# Patient Record
Sex: Female | Born: 1995 | Hispanic: Yes | Marital: Single | Smoking: Never smoker
Health system: Southern US, Community
[De-identification: ages and names within clinical notes are randomized; demographics above are authoritative.]

## PROBLEM LIST (undated history)

## (undated) DIAGNOSIS — I10 Essential (primary) hypertension: Secondary | ICD-10-CM

## (undated) DIAGNOSIS — Z9289 Personal history of other medical treatment: Secondary | ICD-10-CM

## (undated) DIAGNOSIS — R569 Unspecified convulsions: Secondary | ICD-10-CM

## (undated) DIAGNOSIS — S2243XA Multiple fractures of ribs, bilateral, initial encounter for closed fracture: Secondary | ICD-10-CM

## (undated) DIAGNOSIS — I499 Cardiac arrhythmia, unspecified: Secondary | ICD-10-CM

## (undated) DIAGNOSIS — S42109A Fracture of unspecified part of scapula, unspecified shoulder, initial encounter for closed fracture: Secondary | ICD-10-CM

## (undated) DIAGNOSIS — S36113A Laceration of liver, unspecified degree, initial encounter: Secondary | ICD-10-CM

## (undated) DIAGNOSIS — S069X9A Unspecified intracranial injury with loss of consciousness of unspecified duration, initial encounter: Secondary | ICD-10-CM

## (undated) HISTORY — PX: FRACTURE SURGERY: SHX138

---

## 2017-05-02 DIAGNOSIS — Z9289 Personal history of other medical treatment: Secondary | ICD-10-CM

## 2017-05-02 HISTORY — DX: Personal history of other medical treatment: Z92.89

## 2017-05-30 ENCOUNTER — Inpatient Hospital Stay (HOSPITAL_COMMUNITY): Payer: No Typology Code available for payment source

## 2017-05-30 ENCOUNTER — Emergency Department (HOSPITAL_COMMUNITY): Payer: No Typology Code available for payment source

## 2017-05-30 ENCOUNTER — Inpatient Hospital Stay (HOSPITAL_COMMUNITY)
Admission: EM | Admit: 2017-05-30 | Discharge: 2017-06-24 | DRG: 956 | Disposition: A | Discharge status: Rehabilitation Facility | Payer: No Typology Code available for payment source | Attending: General Surgery | Admitting: General Surgery

## 2017-05-30 ENCOUNTER — Other Ambulatory Visit: Payer: Self-pay

## 2017-05-30 ENCOUNTER — Encounter (HOSPITAL_COMMUNITY): Payer: Self-pay | Admitting: Emergency Medicine

## 2017-05-30 DIAGNOSIS — E872 Acidosis: Secondary | ICD-10-CM | POA: Diagnosis present

## 2017-05-30 DIAGNOSIS — T84126A Displacement of internal fixation device of bone of right lower leg, initial encounter: Secondary | ICD-10-CM | POA: Diagnosis not present

## 2017-05-30 DIAGNOSIS — R509 Fever, unspecified: Secondary | ICD-10-CM | POA: Diagnosis present

## 2017-05-30 DIAGNOSIS — S82251C Displaced comminuted fracture of shaft of right tibia, initial encounter for open fracture type IIIA, IIIB, or IIIC: Secondary | ICD-10-CM | POA: Diagnosis present

## 2017-05-30 DIAGNOSIS — S72351A Displaced comminuted fracture of shaft of right femur, initial encounter for closed fracture: Secondary | ICD-10-CM | POA: Diagnosis present

## 2017-05-30 DIAGNOSIS — A419 Sepsis, unspecified organism: Secondary | ICD-10-CM | POA: Diagnosis not present

## 2017-05-30 DIAGNOSIS — Y92410 Unspecified street and highway as the place of occurrence of the external cause: Secondary | ICD-10-CM | POA: Diagnosis not present

## 2017-05-30 DIAGNOSIS — S32129A Unspecified Zone II fracture of sacrum, initial encounter for closed fracture: Secondary | ICD-10-CM | POA: Diagnosis present

## 2017-05-30 DIAGNOSIS — S82451C Displaced comminuted fracture of shaft of right fibula, initial encounter for open fracture type IIIA, IIIB, or IIIC: Secondary | ICD-10-CM | POA: Diagnosis present

## 2017-05-30 DIAGNOSIS — S32810A Multiple fractures of pelvis with stable disruption of pelvic ring, initial encounter for closed fracture: Secondary | ICD-10-CM | POA: Diagnosis present

## 2017-05-30 DIAGNOSIS — Z419 Encounter for procedure for purposes other than remedying health state, unspecified: Secondary | ICD-10-CM

## 2017-05-30 DIAGNOSIS — S82871A Displaced pilon fracture of right tibia, initial encounter for closed fracture: Secondary | ICD-10-CM

## 2017-05-30 DIAGNOSIS — T1490XA Injury, unspecified, initial encounter: Secondary | ICD-10-CM

## 2017-05-30 DIAGNOSIS — D62 Acute posthemorrhagic anemia: Secondary | ICD-10-CM | POA: Diagnosis present

## 2017-05-30 DIAGNOSIS — S32592A Other specified fracture of left pubis, initial encounter for closed fracture: Secondary | ICD-10-CM

## 2017-05-30 DIAGNOSIS — R1310 Dysphagia, unspecified: Secondary | ICD-10-CM

## 2017-05-30 DIAGNOSIS — S36113A Laceration of liver, unspecified degree, initial encounter: Secondary | ICD-10-CM

## 2017-05-30 DIAGNOSIS — S27322A Contusion of lung, bilateral, initial encounter: Secondary | ICD-10-CM | POA: Diagnosis present

## 2017-05-30 DIAGNOSIS — S72351K Displaced comminuted fracture of shaft of right femur, subsequent encounter for closed fracture with nonunion: Secondary | ICD-10-CM

## 2017-05-30 DIAGNOSIS — T07XXXA Unspecified multiple injuries, initial encounter: Secondary | ICD-10-CM

## 2017-05-30 DIAGNOSIS — Z4682 Encounter for fitting and adjustment of non-vascular catheter: Secondary | ICD-10-CM

## 2017-05-30 DIAGNOSIS — S92121A Displaced fracture of body of right talus, initial encounter for closed fracture: Secondary | ICD-10-CM | POA: Diagnosis present

## 2017-05-30 DIAGNOSIS — S32591A Other specified fracture of right pubis, initial encounter for closed fracture: Secondary | ICD-10-CM | POA: Diagnosis present

## 2017-05-30 DIAGNOSIS — Y848 Other medical procedures as the cause of abnormal reaction of the patient, or of later complication, without mention of misadventure at the time of the procedure: Secondary | ICD-10-CM | POA: Diagnosis not present

## 2017-05-30 DIAGNOSIS — J80 Acute respiratory distress syndrome: Secondary | ICD-10-CM | POA: Diagnosis not present

## 2017-05-30 DIAGNOSIS — K117 Disturbances of salivary secretion: Secondary | ICD-10-CM

## 2017-05-30 DIAGNOSIS — N3289 Other specified disorders of bladder: Secondary | ICD-10-CM | POA: Diagnosis present

## 2017-05-30 DIAGNOSIS — R578 Other shock: Secondary | ICD-10-CM | POA: Diagnosis present

## 2017-05-30 DIAGNOSIS — S064X9A Epidural hemorrhage with loss of consciousness of unspecified duration, initial encounter: Secondary | ICD-10-CM | POA: Diagnosis present

## 2017-05-30 DIAGNOSIS — J189 Pneumonia, unspecified organism: Secondary | ICD-10-CM | POA: Diagnosis not present

## 2017-05-30 DIAGNOSIS — R6521 Severe sepsis with septic shock: Secondary | ICD-10-CM | POA: Diagnosis not present

## 2017-05-30 DIAGNOSIS — R0902 Hypoxemia: Secondary | ICD-10-CM

## 2017-05-30 DIAGNOSIS — S2242XA Multiple fractures of ribs, left side, initial encounter for closed fracture: Secondary | ICD-10-CM

## 2017-05-30 DIAGNOSIS — S272XXA Traumatic hemopneumothorax, initial encounter: Secondary | ICD-10-CM | POA: Diagnosis present

## 2017-05-30 DIAGNOSIS — Z4659 Encounter for fitting and adjustment of other gastrointestinal appliance and device: Secondary | ICD-10-CM

## 2017-05-30 DIAGNOSIS — S82871C Displaced pilon fracture of right tibia, initial encounter for open fracture type IIIA, IIIB, or IIIC: Secondary | ICD-10-CM | POA: Diagnosis present

## 2017-05-30 DIAGNOSIS — S02113A Unspecified occipital condyle fracture, initial encounter for closed fracture: Secondary | ICD-10-CM | POA: Diagnosis present

## 2017-05-30 DIAGNOSIS — S7291XA Unspecified fracture of right femur, initial encounter for closed fracture: Secondary | ICD-10-CM

## 2017-05-30 DIAGNOSIS — R109 Unspecified abdominal pain: Secondary | ICD-10-CM

## 2017-05-30 DIAGNOSIS — E46 Unspecified protein-calorie malnutrition: Secondary | ICD-10-CM | POA: Diagnosis not present

## 2017-05-30 DIAGNOSIS — J13 Pneumonia due to Streptococcus pneumoniae: Secondary | ICD-10-CM | POA: Diagnosis not present

## 2017-05-30 DIAGNOSIS — K5900 Constipation, unspecified: Secondary | ICD-10-CM | POA: Diagnosis not present

## 2017-05-30 DIAGNOSIS — M79661 Pain in right lower leg: Secondary | ICD-10-CM | POA: Diagnosis present

## 2017-05-30 DIAGNOSIS — E876 Hypokalemia: Secondary | ICD-10-CM | POA: Diagnosis present

## 2017-05-30 DIAGNOSIS — S298XXA Other specified injuries of thorax, initial encounter: Secondary | ICD-10-CM

## 2017-05-30 DIAGNOSIS — F419 Anxiety disorder, unspecified: Secondary | ICD-10-CM | POA: Diagnosis not present

## 2017-05-30 DIAGNOSIS — S27321A Contusion of lung, unilateral, initial encounter: Secondary | ICD-10-CM

## 2017-05-30 DIAGNOSIS — G92 Toxic encephalopathy: Secondary | ICD-10-CM | POA: Diagnosis not present

## 2017-05-30 DIAGNOSIS — S3991XA Unspecified injury of abdomen, initial encounter: Secondary | ICD-10-CM

## 2017-05-30 DIAGNOSIS — S299XXA Unspecified injury of thorax, initial encounter: Secondary | ICD-10-CM

## 2017-05-30 DIAGNOSIS — Z452 Encounter for adjustment and management of vascular access device: Secondary | ICD-10-CM

## 2017-05-30 DIAGNOSIS — Y95 Nosocomial condition: Secondary | ICD-10-CM | POA: Diagnosis not present

## 2017-05-30 DIAGNOSIS — R680 Hypothermia, not associated with low environmental temperature: Secondary | ICD-10-CM | POA: Diagnosis not present

## 2017-05-30 DIAGNOSIS — J9690 Respiratory failure, unspecified, unspecified whether with hypoxia or hypercapnia: Secondary | ICD-10-CM

## 2017-05-30 DIAGNOSIS — T148XXA Other injury of unspecified body region, initial encounter: Secondary | ICD-10-CM

## 2017-05-30 DIAGNOSIS — S82142A Displaced bicondylar fracture of left tibia, initial encounter for closed fracture: Secondary | ICD-10-CM

## 2017-05-30 DIAGNOSIS — J942 Hemothorax: Secondary | ICD-10-CM

## 2017-05-30 DIAGNOSIS — J9601 Acute respiratory failure with hypoxia: Secondary | ICD-10-CM

## 2017-05-30 DIAGNOSIS — J939 Pneumothorax, unspecified: Secondary | ICD-10-CM

## 2017-05-30 DIAGNOSIS — T4275XA Adverse effect of unspecified antiepileptic and sedative-hypnotic drugs, initial encounter: Secondary | ICD-10-CM | POA: Diagnosis not present

## 2017-05-30 DIAGNOSIS — S92109A Unspecified fracture of unspecified talus, initial encounter for closed fracture: Secondary | ICD-10-CM

## 2017-05-30 DIAGNOSIS — R7881 Bacteremia: Secondary | ICD-10-CM | POA: Diagnosis present

## 2017-05-30 DIAGNOSIS — S41112A Laceration without foreign body of left upper arm, initial encounter: Secondary | ICD-10-CM | POA: Diagnosis present

## 2017-05-30 DIAGNOSIS — S82251B Displaced comminuted fracture of shaft of right tibia, initial encounter for open fracture type I or II: Secondary | ICD-10-CM

## 2017-05-30 DIAGNOSIS — S36116A Major laceration of liver, initial encounter: Secondary | ICD-10-CM | POA: Diagnosis present

## 2017-05-30 DIAGNOSIS — R140 Abdominal distension (gaseous): Secondary | ICD-10-CM

## 2017-05-30 DIAGNOSIS — S99912A Unspecified injury of left ankle, initial encounter: Secondary | ICD-10-CM | POA: Diagnosis present

## 2017-05-30 DIAGNOSIS — S0211HA Other fracture of occiput, left side, initial encounter for closed fracture: Secondary | ICD-10-CM | POA: Diagnosis present

## 2017-05-30 DIAGNOSIS — S270XXA Traumatic pneumothorax, initial encounter: Secondary | ICD-10-CM

## 2017-05-30 DIAGNOSIS — B370 Candidal stomatitis: Secondary | ICD-10-CM | POA: Diagnosis not present

## 2017-05-30 DIAGNOSIS — S42102A Fracture of unspecified part of scapula, left shoulder, initial encounter for closed fracture: Secondary | ICD-10-CM | POA: Diagnosis present

## 2017-05-30 DIAGNOSIS — F1010 Alcohol abuse, uncomplicated: Secondary | ICD-10-CM

## 2017-05-30 LAB — BASIC METABOLIC PANEL
ANION GAP: 7 (ref 5–15)
Anion gap: 8 (ref 5–15)
BUN: 9 mg/dL (ref 6–20)
BUN: 10 mg/dL (ref 6–20)
CALCIUM: 6.3 mg/dL — AB (ref 8.9–10.3)
CALCIUM: 6.4 mg/dL — AB (ref 8.9–10.3)
CO2: 16 mmol/L — ABNORMAL LOW (ref 22–32)
CO2: 17 mmol/L — ABNORMAL LOW (ref 22–32)
CREATININE: 0.66 mg/dL (ref 0.44–1.00)
Chloride: 116 mmol/L — ABNORMAL HIGH (ref 101–111)
Chloride: 118 mmol/L — ABNORMAL HIGH (ref 101–111)
Creatinine, Ser: 0.61 mg/dL (ref 0.44–1.00)
GFR calc Af Amer: >60 mL/min (ref >60)
GFR, EST NON AFRICAN AMERICAN: 52 mL/min — AB (ref >60)
GLUCOSE: 79 mg/dL (ref 65–99)
Glucose, Bld: 87 mg/dL (ref 65–99)
POTASSIUM: 3.9 mmol/L (ref 3.5–5.1)
Potassium: 3.5 mmol/L (ref 3.5–5.1)
SODIUM: 140 mmol/L (ref 135–145)
SODIUM: 142 mmol/L (ref 135–145)

## 2017-05-30 LAB — CBC WITH DIFFERENTIAL/PLATELET
BASOS ABS: 0 10*3/uL (ref 0.0–0.1)
BASOS PCT: 0 %
EOS PCT: 0 %
Eosinophils Absolute: 0 10*3/uL (ref 0.0–0.7)
HCT: 33.9 % — ABNORMAL LOW (ref 36.0–46.0)
Hemoglobin: 11.6 g/dL — ABNORMAL LOW (ref 12.0–15.0)
LYMPHS PCT: 6 %
Lymphs Abs: 0.9 10*3/uL (ref 0.7–4.0)
MCH: 29.4 pg (ref 26.0–34.0)
MCHC: 34.2 g/dL (ref 30.0–36.0)
MCV: 85.8 fL (ref 78.0–100.0)
MONO ABS: 0.8 10*3/uL (ref 0.1–1.0)
Monocytes Relative: 6 %
Neutro Abs: 13 10*3/uL — ABNORMAL HIGH (ref 1.7–7.7)
Neutrophils Relative %: 88 %
PLATELETS: 94 10*3/uL — AB (ref 150–400)
RBC: 3.95 MIL/uL (ref 3.87–5.11)
RDW: 14.4 % (ref 11.5–15.5)
WBC: 14.8 10*3/uL — ABNORMAL HIGH (ref 4.0–10.5)

## 2017-05-30 LAB — MASSIVE TRANSFUSION PROTOCOL ORDER (BLOOD BANK NOTIFICATION)

## 2017-05-30 LAB — CBC
HCT: 22 % — ABNORMAL LOW (ref 36.0–46.0)
HCT: 30.2 % — ABNORMAL LOW (ref 36.0–46.0)
HEMOGLOBIN: 7.5 g/dL — AB (ref 12.0–15.0)
Hemoglobin: 10.1 g/dL — ABNORMAL LOW (ref 12.0–15.0)
MCH: 30.6 pg (ref 26.0–34.0)
MCH: 27.8 pg (ref 26.0–34.0)
MCHC: 34.1 g/dL (ref 30.0–36.0)
MCHC: 33.4 g/dL (ref 30.0–36.0)
MCV: 89.8 fL (ref 78.0–100.0)
MCV: 83.2 fL (ref 78.0–100.0)
PLATELETS: 67 10*3/uL — AB (ref 150–400)
Platelets: 181 10*3/uL (ref 150–400)
RBC: 2.45 MIL/uL — AB (ref 3.87–5.11)
RBC: 3.63 MIL/uL — ABNORMAL LOW (ref 3.87–5.11)
RDW: 14.2 % (ref 11.5–15.5)
RDW: 14.4 % (ref 11.5–15.5)
WBC: 24.8 10*3/uL — AB (ref 4.0–10.5)
WBC: 13.9 10*3/uL — ABNORMAL HIGH (ref 4.0–10.5)

## 2017-05-30 LAB — URINALYSIS, ROUTINE W REFLEX MICROSCOPIC
BILIRUBIN URINE: NEGATIVE
GLUCOSE, UA: 50 mg/dL — AB
KETONES UR: NEGATIVE mg/dL
LEUKOCYTES UA: NEGATIVE
NITRITE: NEGATIVE
PH: 6 (ref 5.0–8.0)
Protein, ur: 30 mg/dL — AB
SQUAMOUS EPITHELIAL / LPF: NONE SEEN
Specific Gravity, Urine: 1.01 (ref 1.005–1.030)

## 2017-05-30 LAB — I-STAT BETA HCG BLOOD, ED (MC, WL, AP ONLY): I-stat hCG, quantitative: <5 m[IU]/mL (ref <5)

## 2017-05-30 LAB — I-STAT CHEM 8, ED
BUN: 7 mg/dL (ref 6–20)
CALCIUM ION: 0.9 mmol/L — AB (ref 1.15–1.40)
Chloride: 112 mmol/L — ABNORMAL HIGH (ref 101–111)
Creatinine, Ser: 0.8 mg/dL (ref 0.44–1.00)
GLUCOSE: 151 mg/dL — AB (ref 65–99)
HCT: 18 % — ABNORMAL LOW (ref 36.0–46.0)
HEMOGLOBIN: 6.1 g/dL — AB (ref 12.0–15.0)
Potassium: 3.5 mmol/L (ref 3.5–5.1)
SODIUM: 143 mmol/L (ref 135–145)
TCO2: 16 mmol/L — ABNORMAL LOW (ref 22–32)

## 2017-05-30 LAB — DIC (DISSEMINATED INTRAVASCULAR COAGULATION) PANEL
FIBRINOGEN: 152 mg/dL — AB (ref 210–475)
SMEAR REVIEW: NONE SEEN

## 2017-05-30 LAB — I-STAT CG4 LACTIC ACID, ED: Lactic Acid, Venous: 4.39 mmol/L (ref 0.5–1.9)

## 2017-05-30 LAB — COMPREHENSIVE METABOLIC PANEL
ALK PHOS: 54 U/L (ref 38–126)
ALT: 475 U/L — ABNORMAL HIGH (ref 14–54)
ANION GAP: 8 (ref 5–15)
AST: 679 U/L — ABNORMAL HIGH (ref 15–41)
Albumin: 2 g/dL — ABNORMAL LOW (ref 3.5–5.0)
BUN: 8 mg/dL (ref 6–20)
CALCIUM: 6 mg/dL — AB (ref 8.9–10.3)
CO2: 15 mmol/L — AB (ref 22–32)
Chloride: 116 mmol/L — ABNORMAL HIGH (ref 101–111)
Creatinine, Ser: 0.7 mg/dL (ref 0.44–1.00)
GFR calc non Af Amer: 51 mL/min — ABNORMAL LOW (ref >60)
GFR, EST AFRICAN AMERICAN: 59 mL/min — AB (ref >60)
Glucose, Bld: 156 mg/dL — ABNORMAL HIGH (ref 65–99)
Potassium: 3.5 mmol/L (ref 3.5–5.1)
SODIUM: 139 mmol/L (ref 135–145)
Total Bilirubin: 0.6 mg/dL (ref 0.3–1.2)
Total Protein: 3.7 g/dL — ABNORMAL LOW (ref 6.5–8.1)

## 2017-05-30 LAB — I-STAT ARTERIAL BLOOD GAS, ED
Acid-base deficit: 16 mmol/L — ABNORMAL HIGH (ref 0.0–2.0)
Bicarbonate: 12 mmol/L — ABNORMAL LOW (ref 20.0–28.0)
O2 SAT: 99 %
TCO2: 13 mmol/L — AB (ref 22–32)
pCO2 arterial: 36 mmHg (ref 32.0–48.0)
pH, Arterial: 7.126 — CL (ref 7.350–7.450)
pO2, Arterial: 177 mmHg — ABNORMAL HIGH (ref 83.0–108.0)

## 2017-05-30 LAB — PREPARE RBC (CROSSMATCH)

## 2017-05-30 LAB — PROTIME-INR
INR: 2.16
Prothrombin Time: 23.9 seconds — ABNORMAL HIGH (ref 11.4–15.2)

## 2017-05-30 LAB — DIC (DISSEMINATED INTRAVASCULAR COAGULATION)PANEL
INR: 1.57
Platelets: 75 10*3/uL — ABNORMAL LOW (ref 150–400)
Prothrombin Time: 18.7 seconds — ABNORMAL HIGH (ref 11.4–15.2)
aPTT: 34 seconds (ref 24–36)

## 2017-05-30 LAB — ABO/RH: ABO/RH(D): O POS

## 2017-05-30 LAB — MRSA PCR SCREENING: MRSA BY PCR: NEGATIVE

## 2017-05-30 LAB — POCT I-STAT 3, ART BLOOD GAS (G3+)
Acid-base deficit: 10 mmol/L — ABNORMAL HIGH (ref 0.0–2.0)
Bicarbonate: 15.8 mmol/L — ABNORMAL LOW (ref 20.0–28.0)
O2 SAT: 96 %
PCO2 ART: 34.7 mmHg (ref 32.0–48.0)
PH ART: 7.266 — AB (ref 7.350–7.450)
PO2 ART: 93 mmHg (ref 83.0–108.0)
Patient temperature: 37.1
TCO2: 17 mmol/L — ABNORMAL LOW (ref 22–32)

## 2017-05-30 LAB — ETHANOL: ALCOHOL ETHYL (B): 81 mg/dL — AB (ref <10)

## 2017-05-30 LAB — CDS SEROLOGY

## 2017-05-30 LAB — LACTIC ACID, PLASMA: Lactic Acid, Venous: 4.3 mmol/L (ref 0.5–1.9)

## 2017-05-30 LAB — TRIGLYCERIDES: Triglycerides: 122 mg/dL (ref <150)

## 2017-05-30 MED ORDER — LACTATED RINGERS IV BOLUS (SEPSIS)
1000.0000 mL | Freq: Once | INTRAVENOUS | Status: AC
  Administered 2017-05-31: 1000 mL via INTRAVENOUS

## 2017-05-30 MED ORDER — PROPOFOL 1000 MG/100ML IV EMUL
0.0000 ug/kg/min | INTRAVENOUS | Status: DC
  Administered 2017-05-30 (×2): 40 ug/kg/min via INTRAVENOUS
  Administered 2017-05-31: 25 ug/kg/min via INTRAVENOUS
  Administered 2017-05-31: 30 ug/kg/min via INTRAVENOUS
  Administered 2017-05-31: 20 ug/kg/min via INTRAVENOUS
  Administered 2017-06-01: 25 ug/kg/min via INTRAVENOUS
  Administered 2017-06-02: 40 ug/kg/min via INTRAVENOUS
  Administered 2017-06-02 – 2017-06-03 (×3): 45 ug/kg/min via INTRAVENOUS
  Administered 2017-06-04: 30 ug/kg/min via INTRAVENOUS
  Administered 2017-06-04: 40 ug/kg/min via INTRAVENOUS
  Administered 2017-06-04: 30 ug/kg/min via INTRAVENOUS
  Administered 2017-06-05: 40 ug/kg/min via INTRAVENOUS
  Administered 2017-06-05: 50 ug/kg/min via INTRAVENOUS
  Filled 2017-05-30 (×17): qty 100

## 2017-05-30 MED ORDER — SODIUM CHLORIDE 0.9 % IV SOLN
Freq: Once | INTRAVENOUS | Status: AC
  Administered 2017-05-30: 10:00:00 via INTRAVENOUS

## 2017-05-30 MED ORDER — SODIUM CHLORIDE 0.9 % IV SOLN
10.0000 mL/h | Freq: Once | INTRAVENOUS | Status: AC
  Administered 2017-05-30: 10 mL/h via INTRAVENOUS

## 2017-05-30 MED ORDER — SODIUM CHLORIDE 0.9 % IV SOLN
INTRAVENOUS | Status: DC
  Administered 2017-05-30 – 2017-06-02 (×8): via INTRAVENOUS

## 2017-05-30 MED ORDER — PROPOFOL 1000 MG/100ML IV EMUL
5.0000 ug/kg/min | Freq: Once | INTRAVENOUS | Status: AC
  Administered 2017-05-30: 20 ug/kg/min via INTRAVENOUS

## 2017-05-30 MED ORDER — FENTANYL CITRATE (PF) 100 MCG/2ML IJ SOLN
INTRAMUSCULAR | Status: AC
  Filled 2017-05-30: qty 2

## 2017-05-30 MED ORDER — PANTOPRAZOLE SODIUM 40 MG IV SOLR
40.0000 mg | Freq: Every day | INTRAVENOUS | Status: DC
Start: 2017-05-30 — End: 2017-05-31

## 2017-05-30 MED ORDER — FENTANYL CITRATE (PF) 100 MCG/2ML IJ SOLN
INTRAMUSCULAR | Status: AC | PRN
  Administered 2017-05-30 (×3): 100 ug via INTRAVENOUS

## 2017-05-30 MED ORDER — SODIUM CHLORIDE 0.9 % IV SOLN
1.0000 g | Freq: Once | INTRAVENOUS | Status: AC
  Administered 2017-05-30: 1 g via INTRAVENOUS
  Filled 2017-05-30: qty 10

## 2017-05-30 MED ORDER — MIDAZOLAM HCL 2 MG/2ML IJ SOLN
INTRAMUSCULAR | Status: AC
  Filled 2017-05-30: qty 2

## 2017-05-30 MED ORDER — FENTANYL CITRATE (PF) 250 MCG/5ML IJ SOLN
INTRAMUSCULAR | Status: AC
  Filled 2017-05-30: qty 5

## 2017-05-30 MED ORDER — PROPOFOL 10 MG/ML IV BOLUS
INTRAVENOUS | Status: AC
  Filled 2017-05-30: qty 20

## 2017-05-30 MED ORDER — SODIUM CHLORIDE 0.9 % IV SOLN
0.5000 mg/h | INTRAVENOUS | Status: DC
  Administered 2017-05-30 – 2017-06-05 (×2): 0.5 mg/h via INTRAVENOUS
  Administered 2017-06-05: 1 mg/h via INTRAVENOUS
  Administered 2017-06-08: 2 mg/h via INTRAVENOUS
  Administered 2017-06-09: 1 mg/h via INTRAVENOUS
  Filled 2017-05-30 (×6): qty 10

## 2017-05-30 MED ORDER — FENTANYL CITRATE (PF) 100 MCG/2ML IJ SOLN
50.0000 ug | Freq: Once | INTRAMUSCULAR | Status: AC
  Administered 2017-05-30: 50 ug via INTRAVENOUS

## 2017-05-30 MED ORDER — ORAL CARE MOUTH RINSE
15.0000 mL | OROMUCOSAL | Status: DC
  Administered 2017-05-30 – 2017-06-05 (×46): 15 mL via OROMUCOSAL

## 2017-05-30 MED ORDER — PROPOFOL 1000 MG/100ML IV EMUL
INTRAVENOUS | Status: AC
  Filled 2017-05-30: qty 100

## 2017-05-30 MED ORDER — FENTANYL 2500MCG IN NS 250ML (10MCG/ML) PREMIX INFUSION
25.0000 ug/h | INTRAVENOUS | Status: DC
  Administered 2017-05-30: 275 ug/h via INTRAVENOUS
  Administered 2017-05-30: 50 ug/h via INTRAVENOUS
  Administered 2017-05-31 (×2): 300 ug/h via INTRAVENOUS
  Administered 2017-05-31: 250 ug/h via INTRAVENOUS
  Administered 2017-06-01 – 2017-06-02 (×3): 200 ug/h via INTRAVENOUS
  Administered 2017-06-02: 350 ug/h via INTRAVENOUS
  Administered 2017-06-02 – 2017-06-03 (×2): 250 ug/h via INTRAVENOUS
  Administered 2017-06-03: 10:00:00 via INTRAVENOUS
  Administered 2017-06-03: 350 ug/h via INTRAVENOUS
  Administered 2017-06-04 (×3): 300 ug/h via INTRAVENOUS
  Administered 2017-06-05 (×3): 400 ug/h via INTRAVENOUS
  Administered 2017-06-05: 375 ug/h via INTRAVENOUS
  Administered 2017-06-06 – 2017-06-09 (×12): 400 ug/h via INTRAVENOUS
  Administered 2017-06-10: 200 ug/h via INTRAVENOUS
  Administered 2017-06-10 (×2): 400 ug/h via INTRAVENOUS
  Administered 2017-06-11: 200 ug/h via INTRAVENOUS
  Administered 2017-06-11: 300 ug/h via INTRAVENOUS
  Administered 2017-06-11: 250 ug/h via INTRAVENOUS
  Administered 2017-06-11: 300 ug/h via INTRAVENOUS
  Administered 2017-06-12: 350 ug/h via INTRAVENOUS
  Filled 2017-05-30 (×46): qty 250

## 2017-05-30 MED ORDER — FENTANYL BOLUS VIA INFUSION
50.0000 ug | INTRAVENOUS | Status: DC | PRN
  Administered 2017-06-03: 100 ug via INTRAVENOUS
  Filled 2017-05-30 (×2): qty 50

## 2017-05-30 MED ORDER — MIDAZOLAM HCL 5 MG/5ML IJ SOLN
INTRAMUSCULAR | Status: AC | PRN
  Administered 2017-05-30 (×2): 2 mg via INTRAVENOUS

## 2017-05-30 MED ORDER — FENTANYL CITRATE (PF) 100 MCG/2ML IJ SOLN
INTRAMUSCULAR | Status: AC
  Administered 2017-05-30: 100 ug via INTRAVENOUS
  Filled 2017-05-30: qty 2

## 2017-05-30 MED ORDER — SUCCINYLCHOLINE CHLORIDE 20 MG/ML IJ SOLN
INTRAMUSCULAR | Status: AC | PRN
  Administered 2017-05-30: 120 mg via INTRAVENOUS

## 2017-05-30 MED ORDER — PANTOPRAZOLE SODIUM 40 MG PO TBEC: 40.0000 mg | DELAYED_RELEASE_TABLET | Freq: Every day | ORAL | Status: DC

## 2017-05-30 MED ORDER — SODIUM CHLORIDE 0.9 % IV BOLUS (SEPSIS)
500.0000 mL | Freq: Once | INTRAVENOUS | Status: AC
  Administered 2017-05-30: 500 mL via INTRAVENOUS

## 2017-05-30 MED ORDER — MIDAZOLAM HCL 2 MG/2ML IJ SOLN
INTRAMUSCULAR | Status: AC
  Administered 2017-05-30: 2 mg
  Filled 2017-05-30: qty 2

## 2017-05-30 MED ORDER — BISACODYL 10 MG RE SUPP
10.0000 mg | Freq: Every day | RECTAL | Status: DC | PRN
  Administered 2017-06-06 – 2017-06-20 (×4): 10 mg via RECTAL
  Filled 2017-05-30 (×4): qty 1

## 2017-05-30 MED ORDER — CEFAZOLIN SODIUM-DEXTROSE 1-4 GM/50ML-% IV SOLN
1.0000 g | Freq: Three times a day (TID) | INTRAVENOUS | Status: DC
  Administered 2017-05-30 – 2017-06-03 (×15): 1 g via INTRAVENOUS
  Filled 2017-05-30 (×15): qty 50

## 2017-05-30 MED ORDER — ETOMIDATE 2 MG/ML IV SOLN
INTRAVENOUS | Status: AC | PRN
  Administered 2017-05-30: 20 mg via INTRAVENOUS

## 2017-05-30 MED ORDER — FENTANYL 2500MCG IN NS 250ML (10MCG/ML) PREMIX INFUSION
10.0000 ug/h | INTRAVENOUS | Status: DC
  Administered 2017-05-30: 50 ug/h via INTRAVENOUS
  Filled 2017-05-30: qty 250

## 2017-05-30 MED ORDER — CEFAZOLIN SODIUM-DEXTROSE 1-4 GM/50ML-% IV SOLN
1.0000 g | Freq: Once | INTRAVENOUS | Status: AC
  Administered 2017-05-30: 1 g via INTRAVENOUS
  Filled 2017-05-30: qty 50

## 2017-05-30 MED ORDER — TETANUS-DIPHTH-ACELL PERTUSSIS 5-2.5-18.5 LF-MCG/0.5 IM SUSP
0.5000 mL | Freq: Once | INTRAMUSCULAR | Status: AC
  Administered 2017-05-30: 0.5 mL via INTRAMUSCULAR
  Filled 2017-05-30: qty 0.5

## 2017-05-30 MED ORDER — IOPAMIDOL (ISOVUE-370) INJECTION 76%
INTRAVENOUS | Status: AC
  Administered 2017-05-30: 50 mL
  Filled 2017-05-30: qty 50

## 2017-05-30 MED ORDER — CHLORHEXIDINE GLUCONATE 0.12% ORAL RINSE (MEDLINE KIT)
15.0000 mL | Freq: Two times a day (BID) | OROMUCOSAL | Status: DC
  Administered 2017-05-30 – 2017-06-04 (×10): 15 mL via OROMUCOSAL

## 2017-05-30 MED ORDER — SODIUM CHLORIDE 0.9 % IV SOLN
INTRAVENOUS | Status: AC | PRN
  Administered 2017-05-30 (×3): 1000 mL via INTRAVENOUS

## 2017-05-30 MED ORDER — IOPAMIDOL (ISOVUE-300) INJECTION 61%
INTRAVENOUS | Status: AC
  Administered 2017-05-30: 100 mL
  Filled 2017-05-30: qty 100

## 2017-05-30 MED ORDER — DOCUSATE SODIUM 100 MG PO CAPS: 100.0000 mg | ORAL_CAPSULE | Freq: Two times a day (BID) | ORAL | Status: DC
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
## 2017-05-30 NOTE — ED Notes (Signed)
[facility] @ [phone] to [phone].

## 2017-05-30 NOTE — ED Notes (Signed)
To ct

## 2017-05-30 NOTE — ED Notes (Signed)
ED Provider at bedside. 

## 2017-05-30 NOTE — ED Notes (Signed)
Second unit of Plasma finished W [id number]

## 2017-05-30 NOTE — ED Notes (Addendum)
Holding Off on sedation medications with hypotension. Pt is resting comfortably with eyes closed at this time. Awaiting further orders and admission orders from Trauma MD

## 2017-05-30 NOTE — ED Notes (Signed)
[facility] returned my call @ [phone] and stated that they do not provide ortho beds to hang traction, and stated that this NS should call Facilities and that they should be able to provide it.
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
## 2017-05-30 NOTE — ED Notes (Signed)
Pt transported to CT on monitor with RN, EMT, and RT.
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
## 2017-05-30 NOTE — ED Notes (Signed)
Returned from CT.

## 2017-05-30 NOTE — ED Notes (Signed)
Attemped to call [facility] @ [phone] to see if they provide the needed bed. No answer w/ #[phone]. Paged the pager for materials at [phone]. [facility] returned my call at [phone] and stated they do not provide ortho beds either. Suggest that [facility] or [facility] will provide the bed.

## 2017-05-30 NOTE — ED Notes (Signed)
Called Victorino DikeJennifer w/ [facility] to see if they provide ortho beds to hang traction, and they stated they do not.

## 2017-05-30 NOTE — ED Notes (Signed)
[facility] @ [phone] to [phone].

## 2017-05-30 NOTE — ED Notes (Signed)
Pts brother has arrived.

## 2017-05-30 NOTE — Progress Notes (Signed)
RT note- Ventilator adjustment post ABG, RN informed.

## 2017-05-30 NOTE — ED Notes (Signed)
Tra[facility] @ [phone] to [phone].
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
## 2017-05-30 NOTE — ED Notes (Addendum)
3rd unit complete. 4th unit of PRB cells started. MD Ray states to hang plasma afterwards.
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
## 2017-05-30 NOTE — ED Notes (Signed)
First unit of plasma W [id number] [phone]
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
## 2017-05-30 NOTE — ED Notes (Signed)
Pt is resting comfortably at this time.

## 2017-05-30 NOTE — ED Notes (Signed)
First unit of blood complete. 2nd started [id number]

## 2017-05-30 NOTE — ED Notes (Signed)
Report Called to RN, CT states we may come to room one in 10 minutes, will plan to transport pt to CT then ICU in 10 minutes.

## 2017-05-30 NOTE — ED Notes (Signed)
Portable Equipment paged @ [phone] to request a ortho bed - to hang traction, per request of Rea CollegeJessica RN.
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
## 2017-05-30 NOTE — Progress Notes (Signed)
Orthopedic Tech Progress Note Patient Details:  [name] 06/02/1875 [id number]  Musculoskeletal Traction Type of Traction: Skeletal (Balanced Suspension) Traction Location: Bilateral skeletal traction Traction Weight: 10 lbs   Post Interventions Patient Tolerated: Fair Instructions Provided: Adjustment of device   Saul FordyceJennifer C Tashawn Greff 05/30/2017, 1:41 PM

## 2017-05-30 NOTE — ED Notes (Signed)
Trauma [facility] @ [phone] to [phone].

## 2017-05-30 NOTE — ED Notes (Signed)
Per edp Wickline hold off on blood, preparing to intubate pt.

## 2017-05-30 NOTE — ED Notes (Signed)
Portable x-rays still being completed

## 2017-05-30 NOTE — ED Notes (Signed)
Per blood bank, blood is available for pick up if needed.

## 2017-05-30 NOTE — ED Notes (Signed)
Dr. Bebe ShaggyWickline preparing to intubate

## 2017-05-30 NOTE — ED Notes (Signed)
Ortho tech at bedside to splint R leg
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
## 2017-05-30 NOTE — ED Notes (Signed)
MD Eulah PontMurphy at bedside placing traction to bilateral lower legs.

## 2017-05-30 NOTE — Progress Notes (Signed)
RT note-Patient transported to 4N29, therapist notified.

## 2017-05-30 NOTE — Progress Notes (Signed)
Patient transported to CT scan and back to 4N29 without incidence.

## 2017-05-30 NOTE — ED Provider Notes (Signed)
Patient care discussed with Dr. Bebe ShaggyWickline in signout.  Awaiting trauma callback.  Patient is hypotensive with systolic blood pressure 69 heart rate 140.  CT abdomen and chest results are pending.  Patient has received 2 units of packed red blood cells.  4 more units ordered and massive transfusion protocol started.  Discussed with Dennie MaizesJessica Easley RN.   Margarita Grizzleay, Emalina Dubreuil, MD 05/30/17 [phone]
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
## 2017-05-30 NOTE — ED Notes (Signed)
Called the after hours number for Facilities, they stated they do not provide anything in relation to beds. Will try other departments to get the ortho bed.

## 2017-05-30 NOTE — ED Notes (Signed)
Traction pins are in place, ortho tech states we must have ortho bed before traction can be applied, waiting for ortho bed to arrive. Briana MuirJamie PA is at bedside suturing left lower shin wound and left bicep laceration. Wounds were irrigated and flushed with sterile gauze and soaked with sterilize gauze prior.

## 2017-05-31 ENCOUNTER — Inpatient Hospital Stay (HOSPITAL_COMMUNITY): Payer: No Typology Code available for payment source

## 2017-05-31 ENCOUNTER — Encounter (HOSPITAL_COMMUNITY): Admission: EM | Disposition: A | Discharge status: Rehabilitation Facility | Payer: Self-pay | Source: Home / Self Care

## 2017-05-31 ENCOUNTER — Emergency Department (HOSPITAL_COMMUNITY): Payer: No Typology Code available for payment source | Admitting: Certified Registered"

## 2017-05-31 DIAGNOSIS — S82251C Displaced comminuted fracture of shaft of right tibia, initial encounter for open fracture type IIIA, IIIB, or IIIC: Secondary | ICD-10-CM | POA: Diagnosis not present

## 2017-05-31 DIAGNOSIS — T1490XA Injury, unspecified, initial encounter: Secondary | ICD-10-CM

## 2017-05-31 DIAGNOSIS — M79661 Pain in right lower leg: Secondary | ICD-10-CM | POA: Diagnosis not present

## 2017-05-31 DIAGNOSIS — D62 Acute posthemorrhagic anemia: Secondary | ICD-10-CM

## 2017-05-31 HISTORY — PX: FEMUR IM NAIL: SHX1597

## 2017-05-31 HISTORY — PX: ORIF ANKLE FRACTURE: SHX5408

## 2017-05-31 LAB — BASIC METABOLIC PANEL
ANION GAP: 7 (ref 5–15)
BUN: 8 mg/dL (ref 6–20)
CALCIUM: 6.7 mg/dL — AB (ref 8.9–10.3)
CO2: 17 mmol/L — ABNORMAL LOW (ref 22–32)
CREATININE: 0.6 mg/dL (ref 0.44–1.00)
Chloride: 119 mmol/L — ABNORMAL HIGH (ref 101–111)
GLUCOSE: 98 mg/dL (ref 65–99)
Potassium: 3.8 mmol/L (ref 3.5–5.1)
Sodium: 143 mmol/L (ref 135–145)

## 2017-05-31 LAB — PROTIME-INR
INR: 1.48
INR: 1.52
INR: 1.55
PROTHROMBIN TIME: 17.8 s — AB (ref 11.4–15.2)
PROTHROMBIN TIME: 18.5 s — AB (ref 11.4–15.2)
Prothrombin Time: 18.2 seconds — ABNORMAL HIGH (ref 11.4–15.2)

## 2017-05-31 LAB — BPAM PLATELET PHERESIS
BLOOD PRODUCT EXPIRATION DATE: 201812292359
ISSUE DATE / TIME: 201812290818
UNIT TYPE AND RH: 9500

## 2017-05-31 LAB — CBC WITH DIFFERENTIAL/PLATELET
BASOS ABS: 0 10*3/uL (ref 0.0–0.1)
BASOS PCT: 0 %
Basophils Absolute: 0 10*3/uL (ref 0.0–0.1)
Basophils Relative: 0 %
EOS ABS: 0 10*3/uL (ref 0.0–0.7)
EOS ABS: 0 10*3/uL (ref 0.0–0.7)
Eosinophils Relative: 0 %
Eosinophils Relative: 0 %
HCT: 26.9 % — ABNORMAL LOW (ref 36.0–46.0)
HCT: 35.4 % — ABNORMAL LOW (ref 36.0–46.0)
HEMOGLOBIN: 12 g/dL (ref 12.0–15.0)
HEMOGLOBIN: 9.3 g/dL — AB (ref 12.0–15.0)
LYMPHS ABS: 0.7 10*3/uL (ref 0.7–4.0)
LYMPHS PCT: 6 %
Lymphocytes Relative: 6 %
Lymphs Abs: 1.1 10*3/uL (ref 0.7–4.0)
MCH: 29.2 pg (ref 26.0–34.0)
MCH: 28.7 pg (ref 26.0–34.0)
MCHC: 33.9 g/dL (ref 30.0–36.0)
MCHC: 34.6 g/dL (ref 30.0–36.0)
MCV: 84.3 fL (ref 78.0–100.0)
MCV: 84.7 fL (ref 78.0–100.0)
MONO ABS: 0.5 10*3/uL (ref 0.1–1.0)
Monocytes Absolute: 0.6 10*3/uL (ref 0.1–1.0)
Monocytes Relative: 3 %
Monocytes Relative: 5 %
NEUTROS ABS: 16 10*3/uL — AB (ref 1.7–7.7)
NEUTROS PCT: 89 %
NEUTROS PCT: 91 %
Neutro Abs: 9.6 10*3/uL — ABNORMAL HIGH (ref 1.7–7.7)
PLATELETS: 78 10*3/uL — AB (ref 150–400)
Platelets: 46 10*3/uL — ABNORMAL LOW (ref 150–400)
RBC: 3.19 MIL/uL — ABNORMAL LOW (ref 3.87–5.11)
RBC: 4.18 MIL/uL (ref 3.87–5.11)
RDW: 15 % (ref 11.5–15.5)
RDW: 15.5 % (ref 11.5–15.5)
WBC Morphology: INCREASED
WBC: 17.6 10*3/uL — ABNORMAL HIGH (ref 4.0–10.5)
WBC: 10.9 10*3/uL — AB (ref 4.0–10.5)

## 2017-05-31 LAB — CBC
HCT: 24.4 % — ABNORMAL LOW (ref 36.0–46.0)
HEMOGLOBIN: 8.4 g/dL — AB (ref 12.0–15.0)
MCH: 29 pg (ref 26.0–34.0)
MCHC: 34.4 g/dL (ref 30.0–36.0)
MCV: 84.1 fL (ref 78.0–100.0)
PLATELETS: 55 10*3/uL — AB (ref 150–400)
RBC: 2.9 MIL/uL — ABNORMAL LOW (ref 3.87–5.11)
RDW: 15.6 % — AB (ref 11.5–15.5)
WBC: 13.4 10*3/uL — ABNORMAL HIGH (ref 4.0–10.5)

## 2017-05-31 LAB — COMPREHENSIVE METABOLIC PANEL
ALBUMIN: 2.1 g/dL — AB (ref 3.5–5.0)
ALT: 231 U/L — ABNORMAL HIGH (ref 14–54)
ANION GAP: 7 (ref 5–15)
AST: 373 U/L — ABNORMAL HIGH (ref 15–41)
Alkaline Phosphatase: 43 U/L (ref 38–126)
BUN: 7 mg/dL (ref 6–20)
CHLORIDE: 115 mmol/L — AB (ref 101–111)
CO2: 17 mmol/L — AB (ref 22–32)
Calcium: 6.5 mg/dL — ABNORMAL LOW (ref 8.9–10.3)
Creatinine, Ser: 0.57 mg/dL (ref 0.44–1.00)
GFR calc Af Amer: >60 mL/min (ref >60)
GFR calc non Af Amer: >60 mL/min (ref >60)
GLUCOSE: 103 mg/dL — AB (ref 65–99)
POTASSIUM: 3.8 mmol/L (ref 3.5–5.1)
SODIUM: 139 mmol/L (ref 135–145)
Total Bilirubin: 0.7 mg/dL (ref 0.3–1.2)
Total Protein: 3.9 g/dL — ABNORMAL LOW (ref 6.5–8.1)

## 2017-05-31 LAB — POCT I-STAT, CHEM 8
BUN: <3 mg/dL — ABNORMAL LOW (ref 6–20)
CALCIUM ION: 1.04 mmol/L — AB (ref 1.15–1.40)
CHLORIDE: 111 mmol/L (ref 101–111)
Creatinine, Ser: <0.2 mg/dL — ABNORMAL LOW (ref 0.44–1.00)
Glucose, Bld: 135 mg/dL — ABNORMAL HIGH (ref 65–99)
HEMATOCRIT: 39 % (ref 36.0–46.0)
Hemoglobin: 13.3 g/dL (ref 12.0–15.0)
POTASSIUM: 3.8 mmol/L (ref 3.5–5.1)
SODIUM: 144 mmol/L (ref 135–145)
TCO2: 18 mmol/L — ABNORMAL LOW (ref 22–32)

## 2017-05-31 LAB — PREPARE PLATELET PHERESIS: Unit division: 0

## 2017-05-31 LAB — LACTIC ACID, PLASMA: LACTIC ACID, VENOUS: 2.4 mmol/L — AB (ref 0.5–1.9)

## 2017-05-31 LAB — APTT: aPTT: 32 seconds (ref 24–36)

## 2017-05-31 LAB — PREPARE RBC (CROSSMATCH)

## 2017-05-31 LAB — MAGNESIUM: MAGNESIUM: 1.3 mg/dL — AB (ref 1.7–2.4)

## 2017-05-31 LAB — PHOSPHORUS: PHOSPHORUS: 3.8 mg/dL (ref 2.5–4.6)

## 2017-05-31 SURGERY — BILATERAL INTRAMEDULLARY (IM) NAIL TIBIAL
Anesthesia: General | Site: Leg Upper | Laterality: Right

## 2017-05-31 MED ORDER — MIDAZOLAM HCL 5 MG/5ML IJ SOLN
INTRAMUSCULAR | Status: DC | PRN
  Administered 2017-05-31 (×2): 2 mg via INTRAVENOUS

## 2017-05-31 MED ORDER — ALBUMIN HUMAN 5 % IV SOLN
INTRAVENOUS | Status: DC | PRN
  Administered 2017-05-31: 14:00:00 via INTRAVENOUS

## 2017-05-31 MED ORDER — ROCURONIUM BROMIDE 10 MG/ML (PF) SYRINGE
PREFILLED_SYRINGE | INTRAVENOUS | Status: DC | PRN
  Administered 2017-05-31: 30 mg via INTRAVENOUS
  Administered 2017-05-31 (×2): 50 mg via INTRAVENOUS

## 2017-05-31 MED ORDER — DOCUSATE SODIUM 50 MG/5ML PO LIQD
100.0000 mg | Freq: Two times a day (BID) | ORAL | Status: DC
  Administered 2017-05-31 – 2017-06-19 (×29): 100 mg
  Filled 2017-05-31 (×29): qty 10

## 2017-05-31 MED ORDER — LACTATED RINGERS IV SOLN
INTRAVENOUS | Status: DC | PRN
  Administered 2017-05-31: 13:00:00 via INTRAVENOUS

## 2017-05-31 MED ORDER — MIDAZOLAM HCL 2 MG/2ML IJ SOLN
INTRAMUSCULAR | Status: AC
  Filled 2017-05-31: qty 2

## 2017-05-31 MED ORDER — SODIUM CHLORIDE 0.9 % IV SOLN
INTRAVENOUS | Status: DC | PRN
  Administered 2017-05-31: 15:00:00 via INTRAVENOUS

## 2017-05-31 MED ORDER — PANTOPRAZOLE SODIUM 40 MG IV SOLR
40.0000 mg | Freq: Every day | INTRAVENOUS | Status: DC
  Administered 2017-06-01 – 2017-06-20 (×6): 40 mg via INTRAVENOUS
  Filled 2017-05-31 (×7): qty 40

## 2017-05-31 MED ORDER — ALBUMIN HUMAN 5 % IV SOLN
25.0000 g | Freq: Once | INTRAVENOUS | Status: AC
  Administered 2017-05-31: 25 g via INTRAVENOUS
  Filled 2017-05-31: qty 250

## 2017-05-31 MED ORDER — HYDROMORPHONE HCL 1 MG/ML IJ SOLN: 0.2500 mg | INTRAMUSCULAR | Status: DC | PRN

## 2017-05-31 MED ORDER — PANTOPRAZOLE SODIUM 40 MG PO PACK
40.0000 mg | PACK | Freq: Every day | ORAL | Status: DC
  Administered 2017-06-04 – 2017-06-17 (×12): 40 mg
  Filled 2017-05-31 (×13): qty 20

## 2017-05-31 MED ORDER — FENTANYL CITRATE (PF) 250 MCG/5ML IJ SOLN
INTRAMUSCULAR | Status: AC
  Filled 2017-05-31: qty 5

## 2017-05-31 MED ORDER — SODIUM CHLORIDE 0.9 % IV SOLN
500.0000 mL | Freq: Once | INTRAVENOUS | Status: AC
  Administered 2017-05-31: 500 mL via INTRAVENOUS

## 2017-05-31 MED ORDER — SODIUM CHLORIDE 0.9 % IV SOLN
0.0000 ug/min | INTRAVENOUS | Status: DC
  Administered 2017-05-31: 65 ug/min via INTRAVENOUS
  Administered 2017-06-01: 80 ug/min via INTRAVENOUS
  Administered 2017-06-03: 85 ug/min via INTRAVENOUS
  Administered 2017-06-03: 5 ug/min via INTRAVENOUS
  Administered 2017-06-04: 75 ug/min via INTRAVENOUS
  Administered 2017-06-04: 20 ug/min via INTRAVENOUS
  Administered 2017-06-05: 340 ug/min via INTRAVENOUS
  Administered 2017-06-05 (×2): 360 ug/min via INTRAVENOUS
  Administered 2017-06-05: 280 ug/min via INTRAVENOUS
  Administered 2017-06-05: 380 ug/min via INTRAVENOUS
  Administered 2017-06-05: 20 ug/min via INTRAVENOUS
  Filled 2017-05-31 (×4): qty 4
  Filled 2017-05-31 (×2): qty 40
  Filled 2017-05-31: qty 4
  Filled 2017-05-31: qty 40
  Filled 2017-05-31: qty 4
  Filled 2017-05-31: qty 40
  Filled 2017-05-31: qty 4
  Filled 2017-05-31 (×2): qty 40
  Filled 2017-05-31: qty 4

## 2017-05-31 MED ORDER — PROMETHAZINE HCL 25 MG/ML IJ SOLN: 6.2500 mg | INTRAMUSCULAR | Status: DC | PRN

## 2017-05-31 MED ORDER — PANTOPRAZOLE SODIUM 40 MG PO PACK: 40.0000 mg | PACK | Freq: Every day | ORAL | Status: DC

## 2017-05-31 MED ORDER — FENTANYL CITRATE (PF) 100 MCG/2ML IJ SOLN
INTRAMUSCULAR | Status: DC | PRN
  Administered 2017-05-31 (×2): 50 ug via INTRAVENOUS

## 2017-05-31 MED ORDER — VASOPRESSIN 20 UNIT/ML IV SOLN
INTRAVENOUS | Status: DC | PRN
  Administered 2017-05-31: 1 [IU] via INTRAVENOUS

## 2017-05-31 MED ORDER — 0.9 % SODIUM CHLORIDE (POUR BTL) OPTIME
TOPICAL | Status: DC | PRN
  Administered 2017-05-31: 1000 mL

## 2017-05-31 MED ORDER — VASOPRESSIN 20 UNIT/ML IV SOLN
INTRAVENOUS | Status: AC
  Filled 2017-05-31: qty 1

## 2017-05-31 MED ORDER — SODIUM CHLORIDE 0.9 % IV SOLN
1.0000 g | Freq: Once | INTRAVENOUS | Status: AC
  Administered 2017-05-31: 1 g via INTRAVENOUS
  Filled 2017-05-31 (×2): qty 10

## 2017-05-31 MED ORDER — SODIUM CHLORIDE 0.9 % IV SOLN
0.0000 ug/min | INTRAVENOUS | Status: DC
  Administered 2017-05-31: 20 ug/min via INTRAVENOUS
  Administered 2017-05-31: 40 ug/min via INTRAVENOUS
  Filled 2017-05-31: qty 1
  Filled 2017-05-31: qty 10
  Filled 2017-05-31: qty 1

## 2017-05-31 MED ORDER — PHENYLEPHRINE 40 MCG/ML (10ML) SYRINGE FOR IV PUSH (FOR BLOOD PRESSURE SUPPORT)
PREFILLED_SYRINGE | INTRAVENOUS | Status: DC | PRN
  Administered 2017-05-31 (×2): 80 ug via INTRAVENOUS

## 2017-05-31 MED ORDER — PROPOFOL 10 MG/ML IV BOLUS
INTRAVENOUS | Status: AC
Start: 2017-05-31 — End: 2017-05-31
  Filled 2017-05-31: qty 20

## 2017-05-31 MED ORDER — 0.9 % SODIUM CHLORIDE (POUR BTL) OPTIME
TOPICAL | Status: DC | PRN
  Administered 2017-05-31: 9000 mL

## 2017-05-31 MED ORDER — SODIUM CHLORIDE 0.9 % IV SOLN: Freq: Once | INTRAVENOUS | Status: DC

## 2017-05-31 MED ORDER — TRANEXAMIC ACID 1000 MG/10ML IV SOLN
1000.0000 mg | INTRAVENOUS | Status: AC
  Administered 2017-05-31: 1000 mg via INTRAVENOUS
  Filled 2017-05-31: qty 10

## 2017-05-31 MED ORDER — MAGNESIUM SULFATE 4 GM/100ML IV SOLN
4.0000 g | Freq: Once | INTRAVENOUS | Status: AC
  Administered 2017-05-31: 4 g via INTRAVENOUS
  Filled 2017-05-31 (×2): qty 100

## 2017-05-31 MED ORDER — ACETAMINOPHEN 160 MG/5ML PO SOLN
650.0000 mg | Freq: Four times a day (QID) | ORAL | Status: DC | PRN
  Administered 2017-05-31 – 2017-06-18 (×14): 650 mg
  Filled 2017-05-31 (×16): qty 20.3

## 2017-05-31 SURGICAL SUPPLY — 101 items
BANDAGE ACE 4X5 VEL STRL LF (GAUZE/BANDAGES/DRESSINGS) IMPLANT
BANDAGE ACE 6X5 VEL STRL LF (GAUZE/BANDAGES/DRESSINGS) ×6 IMPLANT
BANDAGE ELASTIC 6 VELCRO ST LF (GAUZE/BANDAGES/DRESSINGS) ×6 IMPLANT
BANDAGE ESMARK 6X9 LF (GAUZE/BANDAGES/DRESSINGS) IMPLANT
BIT DRILL AO GAMMA 4.2X130 (BIT) ×6 IMPLANT
BIT DRILL AO GAMMA 4.2X340 (BIT) ×6 IMPLANT
BLADE CLIPPER SURG (BLADE) IMPLANT
BLADE SURG 10 STRL SS (BLADE) ×6 IMPLANT
BNDG COHESIVE 4X5 TAN STRL (GAUZE/BANDAGES/DRESSINGS) IMPLANT
BNDG COHESIVE 6X5 TAN STRL LF (GAUZE/BANDAGES/DRESSINGS) IMPLANT
BNDG ESMARK 6X9 LF (GAUZE/BANDAGES/DRESSINGS)
BNDG GAUZE ELAST 4 BULKY (GAUZE/BANDAGES/DRESSINGS) ×6 IMPLANT
CHLORAPREP W/TINT 26ML (MISCELLANEOUS) ×6 IMPLANT
COVER MAYO STAND STRL (DRAPES) ×6 IMPLANT
COVER SURGICAL LIGHT HANDLE (MISCELLANEOUS) ×12 IMPLANT
CUFF TOURNIQUET SINGLE 34IN LL (TOURNIQUET CUFF) ×6 IMPLANT
CUFF TOURNIQUET SINGLE 44IN (TOURNIQUET CUFF) IMPLANT
DRAPE C-ARM 42X72 X-RAY (DRAPES) ×6 IMPLANT
DRAPE C-ARMOR (DRAPES) ×6 IMPLANT
DRAPE HALF SHEET 40X57 (DRAPES) ×6 IMPLANT
DRAPE IMP U-DRAPE 54X76 (DRAPES) ×6 IMPLANT
DRAPE ORTHO SPLIT 77X108 STRL (DRAPES) ×4
DRAPE SURG ORHT 6 SPLT 77X108 (DRAPES) ×8 IMPLANT
DRAPE U-SHAPE 47X51 STRL (DRAPES) ×6 IMPLANT
DRESSING OPSITE X SMALL 2X3 (GAUZE/BANDAGES/DRESSINGS) ×6 IMPLANT
DRILL 2.6X122MM WL AO SHAFT (BIT) ×6 IMPLANT
DRSG ADAPTIC 3X8 NADH LF (GAUZE/BANDAGES/DRESSINGS) ×6 IMPLANT
DRSG MEPILEX BORDER 4X4 (GAUZE/BANDAGES/DRESSINGS) ×12 IMPLANT
DRSG MEPILEX BORDER 4X8 (GAUZE/BANDAGES/DRESSINGS) IMPLANT
DRSG PAD ABDOMINAL 8X10 ST (GAUZE/BANDAGES/DRESSINGS) ×6 IMPLANT
DRSG TEGADERM 4X4.75 (GAUZE/BANDAGES/DRESSINGS) ×12 IMPLANT
DURAPREP 26ML APPLICATOR (WOUND CARE) ×6 IMPLANT
ELECT CAUTERY BLADE 6.4 (BLADE) ×6 IMPLANT
ELECT REM PT RETURN 9FT ADLT (ELECTROSURGICAL) ×6
ELECTRODE REM PT RTRN 9FT ADLT (ELECTROSURGICAL) ×4 IMPLANT
END CAP (Cap) ×6 IMPLANT
END CAP TIBIAL T2 11.5X10 (Cap) ×6 IMPLANT
GAUZE SPONGE 4X4 12PLY STRL (GAUZE/BANDAGES/DRESSINGS) ×6 IMPLANT
GLOVE BIO SURGEON STRL SZ7.5 (GLOVE) ×12 IMPLANT
GLOVE BIOGEL PI IND STRL 8 (GLOVE) ×8 IMPLANT
GLOVE BIOGEL PI INDICATOR 8 (GLOVE) ×4
GOWN STRL REUS W/ TWL LRG LVL3 (GOWN DISPOSABLE) ×12 IMPLANT
GOWN STRL REUS W/ TWL XL LVL3 (GOWN DISPOSABLE) ×4 IMPLANT
GOWN STRL REUS W/TWL LRG LVL3 (GOWN DISPOSABLE) ×6
GOWN STRL REUS W/TWL XL LVL3 (GOWN DISPOSABLE) ×2
GUIDEROD T2 3X1000 (ROD) ×6 IMPLANT
GUIDEWIRE GAMMA (WIRE) ×6 IMPLANT
K-WIRE ORTHOPEDIC 1.4X150L (WIRE) ×6
KIT BASIN OR (CUSTOM PROCEDURE TRAY) ×6 IMPLANT
KIT ROOM TURNOVER OR (KITS) ×6 IMPLANT
KWIRE ORTHOPEDIC 1.4X150L (WIRE) ×4 IMPLANT
MANIFOLD NEPTUNE II (INSTRUMENTS) ×6 IMPLANT
NAIL SUPRACOMDYLAR 10X340MM (Nail) ×6 IMPLANT
NAIL TIBIAL STANDARD 9X300MM (Nail) ×6 IMPLANT
NAIL TIBIAL STD 9X300MM (Nail) ×4 IMPLANT
NEEDLE 22X1 1/2 (OR ONLY) (NEEDLE) ×6 IMPLANT
NS IRRIG 1000ML POUR BTL (IV SOLUTION) ×6 IMPLANT
PACK GENERAL/GYN (CUSTOM PROCEDURE TRAY) ×6 IMPLANT
PACK ORTHO EXTREMITY (CUSTOM PROCEDURE TRAY) ×6 IMPLANT
PACK UNIVERSAL I (CUSTOM PROCEDURE TRAY) ×6 IMPLANT
PAD ARMBOARD 7.5X6 YLW CONV (MISCELLANEOUS) ×12 IMPLANT
PAD CAST 4YDX4 CTTN HI CHSV (CAST SUPPLIES) ×4 IMPLANT
PADDING CAST COTTON 4X4 STRL (CAST SUPPLIES) ×2
PLATE 3H 40.5MM (Plate) ×6 IMPLANT
REAMER SHAFT BIXCUT (INSTRUMENTS) ×12 IMPLANT
SCREW BONE 3.5X28 (Screw) ×6 IMPLANT
SCREW LOCK 20MMX3.5 (Screw) ×6 IMPLANT
SCREW LOCK 3.5X10MM (Screw) ×6 IMPLANT
SCREW LOCKING T2 F/T  5MMX30MM (Screw) ×2 IMPLANT
SCREW LOCKING T2 F/T  5MMX35MM (Screw) ×2 IMPLANT
SCREW LOCKING T2 F/T  5MMX45MM (Screw) ×2 IMPLANT
SCREW LOCKING T2 F/T  5MMX50MM (Screw) ×2 IMPLANT
SCREW LOCKING T2 F/T  5MMX70MM (Screw) ×2 IMPLANT
SCREW LOCKING T2 F/T  5X37.5MM (Screw) ×4 IMPLANT
SCREW LOCKING T2 F/T  5X42.5MM (Screw) ×2 IMPLANT
SCREW LOCKING T2 F/T 5MMX30MM (Screw) ×4 IMPLANT
SCREW LOCKING T2 F/T 5MMX35MM (Screw) ×4 IMPLANT
SCREW LOCKING T2 F/T 5MMX45MM (Screw) ×4 IMPLANT
SCREW LOCKING T2 F/T 5MMX50MM (Screw) ×4 IMPLANT
SCREW LOCKING T2 F/T 5MMX70MM (Screw) ×4 IMPLANT
SCREW LOCKING T2 F/T 5X37.5MM (Screw) ×8 IMPLANT
SCREW LOCKING T2 F/T 5X42.5MM (Screw) ×4 IMPLANT
SPONGE LAP 18X18 X RAY DECT (DISPOSABLE) ×6 IMPLANT
STAPLER VISISTAT 35W (STAPLE) IMPLANT
STOCKINETTE IMPERVIOUS LG (DRAPES) ×6 IMPLANT
SUCTION FRAZIER HANDLE 10FR (MISCELLANEOUS) ×2
SUCTION TUBE FRAZIER 10FR DISP (MISCELLANEOUS) ×4 IMPLANT
SUT ETHILON 3 0 PS 1 (SUTURE) ×42 IMPLANT
SUT MNCRL AB 4-0 PS2 18 (SUTURE) ×6 IMPLANT
SUT MON AB 2-0 CT1 27 (SUTURE) ×6 IMPLANT
SUT VIC AB 0 CT1 27 (SUTURE) ×4
SUT VIC AB 0 CT1 27XBRD ANBCTR (SUTURE) ×8 IMPLANT
SYR BULB IRRIGATION 50ML (SYRINGE) ×6 IMPLANT
SYR CONTROL 10ML LL (SYRINGE) ×6 IMPLANT
TOWEL OR 17X24 6PK STRL BLUE (TOWEL DISPOSABLE) ×6 IMPLANT
TOWEL OR 17X26 10 PK STRL BLUE (TOWEL DISPOSABLE) ×6 IMPLANT
TOWEL OR NON WOVEN STRL DISP B (DISPOSABLE) ×6 IMPLANT
TUBE CONNECTING 12'X1/4 (SUCTIONS) ×1
TUBE CONNECTING 12X1/4 (SUCTIONS) ×5 IMPLANT
WATER STERILE IRR 1000ML POUR (IV SOLUTION) ×6 IMPLANT
YANKAUER SUCT BULB TIP NO VENT (SUCTIONS) ×6 IMPLANT

## 2017-05-31 NOTE — Progress Notes (Signed)
2CRITICAL VALUE ALERT  Critical Value:  Lactic Acid 2.4  Date & Time Notied:  05/31/17 at 1215  Provider Notified: Dr. Lindie SpruceWyatt notified at 1215 on 05/31/17  Orders Received/Actions taken: No orders received at this time.

## 2017-05-31 NOTE — Procedures (Signed)
Chest Tube Insertion Procedure Note  Indications:  Clinically significant Pneumothorax  Pre-operative Diagnosis: Pneumothorax  Post-operative Diagnosis: Pneumothorax and Hemothorax  Procedure Details  Informed consent was obtained for the procedure, including sedation.  Risks of lung perforation, hemorrhage, arrhythmia, and adverse drug reaction were discussed.   After sterile skin prep, using standard technique, a 14 French tube was placed in the right anterolateral 5 rib space.  Findings: 200 ml of serosanguinous fluid obtained  Estimated Blood Loss:  200 mL         Specimens:  None              Complications:  None; patient tolerated the procedure well.         Disposition: Procedure performed at the bedside in [facility] ICU         Condition: stable  Attending Attestation: I performed the procedure.

## 2017-05-31 NOTE — Progress Notes (Signed)
Orthopedic Tech Progress Note Patient Details:  Briana [name] 03/18/1996 [id number]  Ortho Devices Type of Ortho Device: Short leg splint Ortho Device/Splint Interventions: Ordered, Application   Post Interventions Patient Tolerated: Fair Instructions Provided: Adjustment of device   Saul FordyceJennifer C Antonio Creswell 05/31/2017, 5:23 PM
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
## 2017-05-31 NOTE — Op Note (Signed)
05/31/2017  4:39 PM  PATIENT:  Briana CellaErica [name]    PRE-OPERATIVE DIAGNOSIS:  right tibia/fibula fracture  POST-OPERATIVE DIAGNOSIS:  Same  PROCEDURE:  RIGHT INTRAMEDULLARY (IM) NAIL TIBIAL, OPEN REDUCTION INTERNAL FIXATION (ORIF) ANKLE FRACTURE, INTRAMEDULLARY (IM) RETROGRADE FEMORAL NAILING  SURGEON:  MURPHY, TIMOTHY D, MD  ASSISTANT: Aquilla HackerHenry Martensen, PA-C, he was present and scrubbed throughout the case, critical for completion in a timely fashion, and for retraction, instrumentation, and closure.   ANESTHESIA:   gen  PREOPERATIVE INDICATIONS:  Briana [name] is a  21 y.o. female with a diagnosis of right tibia/fibula fracture who failed conservative measures and elected for surgical management.    The risks benefits and alternatives were discussed with the patient preoperatively including but not limited to the risks of infection, bleeding, nerve injury, cardiopulmonary complications, the need for revision surgery, among others, and the patient was willing to proceed.  OPERATIVE IMPLANTS: [employer] nails and locking plate  OPERATIVE FINDINGS: unstable open fractures  BLOOD LOSS: 250  COMPLICATIONS: none  TOURNIQUET TIME: 30min  OPERATIVE PROCEDURE:  Patient was identified in the preoperative holding area and site was marked by me She was transported to the operating theater and placed on the table in supine position taking care to pad all bony prominences. After a preincinduction time out anesthesia was induced. The right lower extremity was prepped and draped in normal sterile fashion and a pre-incision timeout was performed. She received ancef for preoperative antibiotics.   Our first priority was irrigation and debridement of her open fractures at her tibia.  I debrided skin muscle and bone using scissors.  I removed some gross contamination.  I performed a thorough irrigation with 9 L of saline.  Next I turned my attention to her unstable right femur fracture I made an incision  over her patella tendon and then split this tendon.  I placed a guidepin and reamed over this once I was happy with the starting point.  I then placed a ball-tipped guidewire and sequentially reamed her femur after performing a closed reduction of the fracture.  I selected an appropriate nail 10 x 340 and placed this seating it above her into her subtrochanteric region.  I then placed 2 transverse locking screws followed by a proximal locking screw and was happy with the stability here.  She remained stable at this point so we pressed on to her unstable tibia fracture.  Again debridement and thorough irrigation had already been performed I performed a closed reduction of her fracture through the same incision at her knee I inserted a guidewire open this up with a reamer once I was happy with the position and placed a ball-tipped guidewire across her fracture.  I sequentially reamed this and selected a 9 x 300 nail I intentionally selected it slightly short to allow room for ORIF of her P line and medial mall.  I was happy with the alignment here.    I then performed complex closure of her 30 cm of lacerations.  Next I turned my attention to her ankle where I performed an anterior medial approach protecting her neurovascular structures identified the split in her anterior P line is able to reduce her articular surface back to anatomic alignment and held this in place with K wires.  I selected a small locking plate and fix this in the place stabilizing her dye punch peel on with an open fixation here.  Next I fixed her medial mall in the place with this as well.  I  performed an x-ray examination of her talus and the fracture did not move with motion.  She was at this point requiring some blood support so I elected to hold any additional surgery at this time I was happy with our fixation and closure and thorough debridement sterile dressings were applied she is taken back to the ICU remain intubated as  per the plan.  POST OPERATIVE PLAN: NWB BLE, OR Wednesday with Dr. Jena GaussHaddix, DVT px per pprimary team.

## 2017-05-31 NOTE — Anesthesia Preprocedure Evaluation (Addendum)
Anesthesia Evaluation  Patient identified by MRN, date of birth, ID band  Reviewed: Allergy & Precautions, NPO status , Patient's Chart, lab work & pertinent test resultsPreop documentation limited or incomplete due to emergent nature of procedure.  Airway Mallampati: Intubated  TM Distance: >3 FB Neck ROM: Full    Dental no notable dental hx.    Pulmonary neg pulmonary ROS,    Pulmonary exam normal breath sounds clear to auscultation       Cardiovascular negative cardio ROS Normal cardiovascular exam Rhythm:Regular Rate:Normal     Neuro/Psych negative neurological ROS  negative psych ROS   GI/Hepatic negative GI ROS, Neg liver ROS,   Endo/Other  negative endocrine ROS  Renal/GU negative Renal ROS  negative genitourinary   Musculoskeletal negative musculoskeletal ROS (+)   Abdominal   Peds negative pediatric ROS (+)  Hematology  (+) anemia ,   Anesthesia Other Findings   Reproductive/Obstetrics negative OB ROS                            Anesthesia Physical Anesthesia Plan  ASA: II and emergent  Anesthesia Plan: General   Post-op Pain Management:    Induction: Inhalational  PONV Risk Score and Plan: 3 and Ondansetron, Dexamethasone, Midazolam and Treatment may vary due to age or medical condition  Airway Management Planned: Oral ETT  Additional Equipment:   Intra-op Plan:   Post-operative Plan: Post-operative intubation/ventilation  Informed Consent: I have reviewed the patients History and Physical, chart, labs and discussed the procedure including the risks, benefits and alternatives for the proposed anesthesia with the patient or authorized representative who has indicated his/her understanding and acceptance.   Dental advisory given  Plan Discussed with: CRNA and Surgeon  Anesthesia Plan Comments:        Anesthesia Quick Evaluation

## 2017-05-31 NOTE — Progress Notes (Signed)
Follow up - Trauma and Critical Care  Patient Details:    Briana [name] is an 21 y.o. female.  Lines/tubes : Airway 7 mm (Active)  Secured at (cm) 21 cm 05/31/2017  8:37 AM  Measured From Lips 05/31/2017  8:37 AM  Secured Location Center 05/31/2017  8:37 AM  Secured By [employer] 05/31/2017  8:37 AM  Tube Holder Repositioned Yes 05/31/2017  8:37 AM  Cuff Pressure (cm H2O) 26 cm H2O 05/30/2017  8:01 PM  Site Condition Dry 05/30/2017  3:06 PM     NG/OG Tube Orogastric 18 Fr. Center mouth Aucultation Measured external length of tube (Active)  Site Assessment Clean;Dry;Intact 05/30/2017  8:00 PM  Ongoing Placement Verification No change in respiratory status;No acute changes, not attributed to clinical condition;Xray 05/30/2017  8:00 PM  Status Suction-low intermittent 05/30/2017  8:00 PM  Amount of suction 118 mmHg 05/30/2017  8:00 PM  Drainage Appearance Manson Passey 05/30/2017  6:20 AM  Output (mL) 375 mL 05/30/2017  3:00 PM     Urethral Catheter (Active)  Indication for Insertion or Continuance of Catheter Unstable critical patients (first 24-48 hours);Peri-operative use for selective surgical procedure 05/30/2017  8:00 PM  Site Assessment Clean;Intact 05/30/2017  8:00 PM  Catheter Maintenance Catheter secured;No dependent loops;Insertion date on drainage bag;Drainage bag/tubing not touching floor;Seal intact;Bag emptied prior to transport;Bag below level of bladder 05/30/2017  8:00 PM  Collection Container Leg bag 05/30/2017  8:00 PM  Securement Method Leg strap 05/30/2017  8:00 PM  Urinary Catheter Interventions Unclamped 05/30/2017  8:00 PM  Output (mL) 625 mL 05/31/2017  6:00 AM    Microbiology/Sepsis markers: Results for orders placed or performed during the hospital encounter of 05/30/17  MRSA PCR Screening     Status: None   Collection Time: 05/30/17  1:58 PM  Result Value Ref Range Status   MRSA by PCR NEGATIVE NEGATIVE Final    Comment:        The GeneXpert MRSA  Assay (FDA approved for NASAL specimens only), is one component of a comprehensive MRSA colonization surveillance program. It is not intended to diagnose MRSA infection nor to guide or monitor treatment for MRSA infections.     Anti-infectives:  Anti-infectives (From admission, onward)   Start     Dose/Rate Route Frequency Ordered Stop   05/30/17 0930  ceFAZolin (ANCEF) IVPB 1 g/50 mL premix     1 g 100 mL/hr over 30 Minutes Intravenous Every 8 hours 05/30/17 0917     05/30/17 0600  ceFAZolin (ANCEF) IVPB 1 g/50 mL premix     1 g 100 mL/hr over 30 Minutes Intravenous  Once 05/30/17 0556 05/30/17 1610      Best Practice/Protocols:  VTE Prophylaxis: Mechanical GI Prophylaxis: Proton Pump Inhibitor Continous Sedation Difficult to sedate becasue of hypotension.  Consults: Treatment Team:  Md, Trauma, MD Sheral Apley, MD Haddix, Gillie Manners, MD Ditty, Loura Halt, MD    Events:  Subjective:    Overnight Issues: Hypotension.  Not fully resuscitated.  Objective:  Vital signs for last 24 hours: Temp:  [98.4 F (36.9 C)-100.6 F (38.1 C)] 100.4 F (38 C) (12/30 0800) Pulse Rate:  [119-156] 131 (12/30 0800) Resp:  [18-29] 20 (12/30 0800) BP: (70-124)/(46-108) 96/52 (12/30 0800) SpO2:  [90 %-100 %] 93 % (12/30 0837) FiO2 (%):  [30 %-50 %] 30 % (12/30 0837)  Hemodynamic parameters for last 24 hours:    Intake/Output from previous day: 12/29 0701 - 12/30 0700 In: 8913.7 [I.V.:7817.7; Blood:826;  IV Piggyback:270] Out: 2775 [Urine:2400; Emesis/NG output:375]  Intake/Output this shift: No intake/output data recorded.  Vent settings for last 24 hours: Vent Mode: PRVC FiO2 (%):  [30 %-50 %] 30 % Set Rate:  [20 bmp] 20 bmp Vt Set:  [470 mL] 470 mL PEEP:  [5 cmH20] 5 cmH20 Plateau Pressure:  [14 cmH20-17 cmH20] 14 cmH20  Physical Exam:  General: alert, no respiratory distress and very agitated on ventilator because cannot well sedated Neuro: alert,  nonfocal exam, RASS 0 and agitated Resp: clear to auscultation bilaterally CVS: Sinus tachycardia GI: soft, nontender, BS WNL, no r/g Extremities: In bilateral traction  Results for orders placed or performed during the hospital encounter of 05/30/17 (from the past 24 hour(s))  Lactic acid, plasma     Status: Abnormal   Collection Time: 05/30/17 10:58 AM  Result Value Ref Range   Lactic Acid, Venous 4.3 (HH) 0.5 - 1.9 mmol/L  CBC with Differential/Platelet     Status: Abnormal   Collection Time: 05/30/17 10:58 AM  Result Value Ref Range   WBC 14.8 (H) 4.0 - 10.5 K/uL   RBC 3.95 3.87 - 5.11 MIL/uL   Hemoglobin 11.6 (L) 12.0 - 15.0 g/dL   HCT 16.133.9 (L) 09.636.0 - 04.546.0 %   MCV 85.8 78.0 - 100.0 fL   MCH 29.4 26.0 - 34.0 pg   MCHC 34.2 30.0 - 36.0 g/dL   RDW 40.914.4 81.111.5 - 91.415.5 %   Platelets 94 (L) 150 - 400 K/uL   Neutrophils Relative % 88 %   Neutro Abs 13.0 (H) 1.7 - 7.7 K/uL   Lymphocytes Relative 6 %   Lymphs Abs 0.9 0.7 - 4.0 K/uL   Monocytes Relative 6 %   Monocytes Absolute 0.8 0.1 - 1.0 K/uL   Eosinophils Relative 0 %   Eosinophils Absolute 0.0 0.0 - 0.7 K/uL   Basophils Relative 0 %   Basophils Absolute 0.0 0.0 - 0.1 K/uL  Triglycerides     Status: None   Collection Time: 05/30/17 10:58 AM  Result Value Ref Range   Triglycerides 122 <150 mg/dL  Urinalysis, Routine w reflex microscopic     Status: Abnormal   Collection Time: 05/30/17 11:05 AM  Result Value Ref Range   Color, Urine STRAW (A) YELLOW   APPearance CLEAR CLEAR   Specific Gravity, Urine 1.010 1.005 - 1.030   pH 6.0 5.0 - 8.0   Glucose, UA 50 (A) NEGATIVE mg/dL   Hgb urine dipstick LARGE (A) NEGATIVE   Bilirubin Urine NEGATIVE NEGATIVE   Ketones, ur NEGATIVE NEGATIVE mg/dL   Protein, ur 30 (A) NEGATIVE mg/dL   Nitrite NEGATIVE NEGATIVE   Leukocytes, UA NEGATIVE NEGATIVE   RBC / HPF TOO NUMEROUS TO COUNT 0 - 5 RBC/hpf   WBC, UA 0-5 0 - 5 WBC/hpf   Bacteria, UA RARE (A) NONE SEEN   Squamous Epithelial / LPF  NONE SEEN NONE SEEN   Mucus PRESENT   DIC (disseminated intravasc coag) panel (STAT)     Status: Abnormal   Collection Time: 05/30/17  1:24 PM  Result Value Ref Range   Prothrombin Time 18.7 (H) 11.4 - 15.2 seconds   INR 1.57    aPTT 34 24 - 36 seconds   Fibrinogen 152 (L) 210 - 475 mg/dL   D-Dimer, Quant >78.29>20.00 (H) 0.00 - 0.50 ug/mL-FEU   Platelets 75 (L) 150 - 400 K/uL   Smear Review NO SCHISTOCYTES SEEN   CBC     Status: Abnormal  Collection Time: 05/30/17  1:24 PM  Result Value Ref Range   WBC 13.9 (H) 4.0 - 10.5 K/uL   RBC 3.63 (L) 3.87 - 5.11 MIL/uL   Hemoglobin 10.1 (L) 12.0 - 15.0 g/dL   HCT 16.1 (L) 09.6 - 04.5 %   MCV 83.2 78.0 - 100.0 fL   MCH 27.8 26.0 - 34.0 pg   MCHC 33.4 30.0 - 36.0 g/dL   RDW 40.9 81.1 - 91.4 %   Platelets 67 (L) 150 - 400 K/uL  Basic metabolic panel     Status: Abnormal   Collection Time: 05/30/17  1:24 PM  Result Value Ref Range   Sodium 140 135 - 145 mmol/L   Potassium 3.5 3.5 - 5.1 mmol/L   Chloride 116 (H) 101 - 111 mmol/L   CO2 16 (L) 22 - 32 mmol/L   Glucose, Bld 79 65 - 99 mg/dL   BUN 9 6 - 20 mg/dL   Creatinine, Ser 7.82 0.44 - 1.00 mg/dL   Calcium 6.3 (LL) 8.9 - 10.3 mg/dL   GFR calc non Af Amer 52 (L) >60 mL/min   GFR calc Af Amer >60 >60 mL/min   Anion gap 8 5 - 15  I-STAT 3, arterial blood gas (G3+)     Status: Abnormal   Collection Time: 05/30/17  1:47 PM  Result Value Ref Range   pH, Arterial 7.266 (L) 7.350 - 7.450   pCO2 arterial 34.7 32.0 - 48.0 mmHg   pO2, Arterial 93.0 83.0 - 108.0 mmHg   Bicarbonate 15.8 (L) 20.0 - 28.0 mmol/L   TCO2 17 (L) 22 - 32 mmol/L   O2 Saturation 96.0 %   Acid-base deficit 10.0 (H) 0.0 - 2.0 mmol/L   Patient temperature 37.1 C    Collection site RADIAL, ALLEN'S TEST ACCEPTABLE    Drawn by RT    Sample type ARTERIAL   MRSA PCR Screening     Status: None   Collection Time: 05/30/17  1:58 PM  Result Value Ref Range   MRSA by PCR NEGATIVE NEGATIVE  Basic metabolic panel     Status:  Abnormal   Collection Time: 05/30/17  4:38 PM  Result Value Ref Range   Sodium 142 135 - 145 mmol/L   Potassium 3.9 3.5 - 5.1 mmol/L   Chloride 118 (H) 101 - 111 mmol/L   CO2 17 (L) 22 - 32 mmol/L   Glucose, Bld 87 65 - 99 mg/dL   BUN 10 6 - 20 mg/dL   Creatinine, Ser 9.56 0.44 - 1.00 mg/dL   Calcium 6.4 (LL) 8.9 - 10.3 mg/dL   GFR calc non Af Amer >60 >60 mL/min   GFR calc Af Amer >60 >60 mL/min   Anion gap 7 5 - 15  Magnesium     Status: Abnormal   Collection Time: 05/30/17 11:38 PM  Result Value Ref Range   Magnesium 1.3 (L) 1.7 - 2.4 mg/dL  Phosphorus     Status: None   Collection Time: 05/30/17 11:38 PM  Result Value Ref Range   Phosphorus 3.8 2.5 - 4.6 mg/dL  Basic metabolic panel     Status: Abnormal   Collection Time: 05/30/17 11:38 PM  Result Value Ref Range   Sodium 143 135 - 145 mmol/L   Potassium 3.8 3.5 - 5.1 mmol/L   Chloride 119 (H) 101 - 111 mmol/L   CO2 17 (L) 22 - 32 mmol/L   Glucose, Bld 98 65 - 99 mg/dL   BUN 8 6 -  20 mg/dL   Creatinine, Ser 1.61 0.44 - 1.00 mg/dL   Calcium 6.7 (L) 8.9 - 10.3 mg/dL   GFR calc non Af Amer >60 >60 mL/min   GFR calc Af Amer >60 >60 mL/min   Anion gap 7 5 - 15  CBC with Differential/Platelet     Status: Abnormal   Collection Time: 05/30/17 11:38 PM  Result Value Ref Range   WBC 17.6 (H) 4.0 - 10.5 K/uL   RBC 3.19 (L) 3.87 - 5.11 MIL/uL   Hemoglobin 9.3 (L) 12.0 - 15.0 g/dL   HCT 09.6 (L) 04.5 - 40.9 %   MCV 84.3 78.0 - 100.0 fL   MCH 29.2 26.0 - 34.0 pg   MCHC 34.6 30.0 - 36.0 g/dL   RDW 81.1 91.4 - 78.2 %   Platelets 78 (L) 150 - 400 K/uL   Neutrophils Relative % 91 %   Lymphocytes Relative 6 %   Monocytes Relative 3 %   Eosinophils Relative 0 %   Basophils Relative 0 %   Neutro Abs 16.0 (H) 1.7 - 7.7 K/uL   Lymphs Abs 1.1 0.7 - 4.0 K/uL   Monocytes Absolute 0.5 0.1 - 1.0 K/uL   Eosinophils Absolute 0.0 0.0 - 0.7 K/uL   Basophils Absolute 0.0 0.0 - 0.1 K/uL   WBC Morphology INCREASED BANDS (>20% BANDS)    Protime-INR     Status: Abnormal   Collection Time: 05/30/17 11:48 PM  Result Value Ref Range   Prothrombin Time 17.8 (H) 11.4 - 15.2 seconds   INR 1.48   APTT     Status: None   Collection Time: 05/30/17 11:48 PM  Result Value Ref Range   aPTT 32 24 - 36 seconds  CBC     Status: Abnormal   Collection Time: 05/31/17  2:31 AM  Result Value Ref Range   WBC 13.4 (H) 4.0 - 10.5 K/uL   RBC 2.90 (L) 3.87 - 5.11 MIL/uL   Hemoglobin 8.4 (L) 12.0 - 15.0 g/dL   HCT 95.6 (L) 21.3 - 08.6 %   MCV 84.1 78.0 - 100.0 fL   MCH 29.0 26.0 - 34.0 pg   MCHC 34.4 30.0 - 36.0 g/dL   RDW 57.8 (H) 46.9 - 62.9 %   Platelets 55 (L) 150 - 400 K/uL  Comprehensive metabolic panel     Status: Abnormal   Collection Time: 05/31/17  2:31 AM  Result Value Ref Range   Sodium 139 135 - 145 mmol/L   Potassium 3.8 3.5 - 5.1 mmol/L   Chloride 115 (H) 101 - 111 mmol/L   CO2 17 (L) 22 - 32 mmol/L   Glucose, Bld 103 (H) 65 - 99 mg/dL   BUN 7 6 - 20 mg/dL   Creatinine, Ser 5.28 0.44 - 1.00 mg/dL   Calcium 6.5 (L) 8.9 - 10.3 mg/dL   Total Protein 3.9 (L) 6.5 - 8.1 g/dL   Albumin 2.1 (L) 3.5 - 5.0 g/dL   AST 413 (H) 15 - 41 U/L   ALT 231 (H) 14 - 54 U/L   Alkaline Phosphatase 43 38 - 126 U/L   Total Bilirubin 0.7 0.3 - 1.2 mg/dL   GFR calc non Af Amer >60 >60 mL/min   GFR calc Af Amer >60 >60 mL/min   Anion gap 7 5 - 15  Protime-INR     Status: Abnormal   Collection Time: 05/31/17  2:31 AM  Result Value Ref Range   Prothrombin Time 18.2 (H) 11.4 - 15.2  seconds   INR 1.52      Assessment/Plan:   NEURO  Altered Mental Status:  agitation and sedation   Plan: Trying to get patient better sedated  PULM  Pneumothorax (traumatic and on the right)   Plan: Right pigtail chest tube  CARDIO  Sinus Tachycardia   Plan: Needs better sedation  RENAL  Urine output and renal function are good.   Plan: No chagnes  GI  Clinically benign   Plan: CPM  ID  No known infectious sources   Plan: Perioperative  antibiotics  HEME  Anemia acute blood loss anemia)   Plan: Will give more blood for symptomatic anemia and volume resuscitatioin  ENDO No known issues   Plan: CPM  Global Issues  Needs right chest tube from PTX.  Volume resuscitation.  Neo temporarily until can be adequately resuscitated.  Needs more sedation.   LOS: 1 day   Additional comments:I reviewed the patient's new clinical lab test results. cbc/bmet and I reviewed the patients new imaging test results. CXR  Critical Care Total Time*: 30 Minutes  Jimmye NormanJames Stevon Gough 05/31/2017  *Care during the described time interval was provided by me and/or other providers on the critical care team.  I have reviewed this patient's available data, including medical history, events of note, physical examination and test results as part of my evaluation.
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
## 2017-05-31 NOTE — Transfer of Care (Signed)
Immediate Anesthesia Transfer of Care Note  Patient: [name]  Procedure(s) Performed: RIGHT INTRAMEDULLARY (IM) NAIL TIBIAL (Right Leg Lower) OPEN REDUCTION INTERNAL FIXATION (ORIF) ANKLE FRACTURE (Right Ankle) INTRAMEDULLARY (IM) RETROGRADE FEMORAL NAILING (Right Leg Upper)  Patient Location: ICU  Anesthesia Type:General  Level of Consciousness: Patient remains intubated per anesthesia plan  Airway & Oxygen Therapy: Patient remains intubated per anesthesia plan and Patient placed on Ventilator (see vital sign flow sheet for setting)  Post-op Assessment: Report given to RN and Post -op Vital signs reviewed and stable  Post vital signs: Reviewed and stable  Last Vitals:  Vitals:   05/31/17 1250 05/31/17 1255  BP: (!) 85/51 (!) 81/46  Pulse: (!) 125 (!) 123  Resp: 20 20  Temp: 37.9 C 37.9 C  SpO2: 97% 97%    Last Pain:  Vitals:   05/31/17 1200  TempSrc: Core  PainSc:          Complications: No apparent anesthesia complications

## 2017-05-31 NOTE — Procedures (Signed)
Name:  Briana [name] MRN:  [id number] DOB:  09/30/1995  PROCEDURE NOTE  Procedure:  Central venous catheter placement.  Postop trauma patient I was called stat to help with IV access for creation patient  Indications:  Need for intravenous access and hemodynamic monitoring.  Consent:  Consent was implied due to the emergency nature of the procedure.  Anesthesia:  A total of 10 mL of 1% Lidocaine was used for local infiltration anesthesia.  Procedure summary:  Appropriate equipment was assembled.  The patient was identified as Briana [name] and safety timeout was performed. The patient was placed in Trendelenburg position.  Sterile technique was used. The patient's right anterior chest wall was prepped using chlorhexidine / alcohol scrub and the field was draped in usual sterile fashion with full body drape. After the adequate anesthesia was achieved, the right subclavian vein was cannulated with the introducer needle without difficulty. A guide wire was advanced through the introducer needle, which was then withdrawn. A small skin incision was made at the point of wire entry, the dilator was inserted over the guide wire and appropriate dilation was obtained. The dilator was removed and  triple-lumen catheter was advanced over the guide wire, which was then removed.  All ports were aspirated and flushed with normal saline without difficulty. The catheter was secured into place at 16 cm. Antibiotic patch was placed and sterile dressing was applied. Post-procedure chest x-ray was ordered.  Complications:  No immediate complications were noted.  Hemodynamic parameters and oxygenation remained stable throughout the procedure.  Estimated blood loss:  Less then 5 mL.  Ileana RoupAhmad M Ismail, MD Pulmonary and Critical Care Medicine [facility] Cell: [phone]  05/31/2017, 5:11 PM

## 2017-05-31 NOTE — Progress Notes (Signed)
Patient arrived from OR unstable.  4 units of PRBCs given through rapid infusion.  MD at bedside placing right subclavian and right brachial arterial line.
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
## 2017-05-31 NOTE — Anesthesia Postprocedure Evaluation (Signed)
Anesthesia Post Note  Patient: Briana [name]  Procedure(s) Performed: RIGHT INTRAMEDULLARY (IM) NAIL TIBIAL (Right Leg Lower) OPEN REDUCTION INTERNAL FIXATION (ORIF) ANKLE FRACTURE (Right Ankle) INTRAMEDULLARY (IM) RETROGRADE FEMORAL NAILING (Right Leg Upper)     Patient location during evaluation: SICU Anesthesia Type: General Level of consciousness: sedated Pain management: pain level controlled Vital Signs Assessment: post-procedure vital signs reviewed and stable Respiratory status: patient remains intubated per anesthesia plan Cardiovascular status: stable Postop Assessment: no apparent nausea or vomiting Anesthetic complications: no    Last Vitals:  Vitals:   05/31/17 1255 05/31/17 1715  BP: (!) 81/46   Pulse: (!) 123 (!) 112  Resp: 20 20  Temp: 37.9 C   SpO2: 97% 100%    Last Pain:  Vitals:   05/31/17 1200  TempSrc: Core  PainSc:                  Aubert Choyce S

## 2017-06-01 ENCOUNTER — Inpatient Hospital Stay (HOSPITAL_COMMUNITY): Payer: No Typology Code available for payment source

## 2017-06-01 ENCOUNTER — Encounter (HOSPITAL_COMMUNITY): Payer: Self-pay

## 2017-06-01 LAB — BPAM RBC
BLOOD PRODUCT EXPIRATION DATE: 201901102359
BLOOD PRODUCT EXPIRATION DATE: 201901192359
BLOOD PRODUCT EXPIRATION DATE: 201901252359
BLOOD PRODUCT EXPIRATION DATE: 201901252359
BLOOD PRODUCT EXPIRATION DATE: 201901252359
BLOOD PRODUCT EXPIRATION DATE: 201902012359
Blood Product Expiration Date: 201901102359
Blood Product Expiration Date: 201901192359
Blood Product Expiration Date: 201901252359
Blood Product Expiration Date: 201901252359
Blood Product Expiration Date: 201901252359
Blood Product Expiration Date: 201901252359
Blood Product Expiration Date: 201901252359
Blood Product Expiration Date: 201902012359
Blood Product Expiration Date: 201902012359
Blood Product Expiration Date: 201902012359
ISSUE DATE / TIME: 201812290514
ISSUE DATE / TIME: 201812291240
ISSUE DATE / TIME: 201812291415
ISSUE DATE / TIME: 201812292029
ISSUE DATE / TIME: 201812292133
ISSUE DATE / TIME: 201812300001
ISSUE DATE / TIME: 201812300213
ISSUE DATE / TIME: 201812301605
ISSUE DATE / TIME: 201812301605
ISSUE DATE / TIME: 201812301605
ISSUE DATE / TIME: 201812290514
ISSUE DATE / TIME: 201812290800
ISSUE DATE / TIME: 201812290800
ISSUE DATE / TIME: 201812301452
ISSUE DATE / TIME: 201812301452
ISSUE DATE / TIME: 201812301605
UNIT TYPE AND RH: 5100
UNIT TYPE AND RH: 5100
UNIT TYPE AND RH: 5100
UNIT TYPE AND RH: 5100
UNIT TYPE AND RH: 5100
UNIT TYPE AND RH: 5100
UNIT TYPE AND RH: 9500
Unit Type and Rh: 5100
Unit Type and Rh: 5100
Unit Type and Rh: 5100
Unit Type and Rh: 5100
Unit Type and Rh: 5100
Unit Type and Rh: 5100
Unit Type and Rh: 5100
Unit Type and Rh: 5100
Unit Type and Rh: 9500

## 2017-06-01 LAB — TYPE AND SCREEN
ABO/RH(D): O POS
Antibody Screen: NEGATIVE
UNIT DIVISION: 0
UNIT DIVISION: 0
UNIT DIVISION: 0
UNIT DIVISION: 0
UNIT DIVISION: 0
UNIT DIVISION: 0
UNIT DIVISION: 0
UNIT DIVISION: 0
Unit division: 0
Unit division: 0
Unit division: 0
Unit division: 0
Unit division: 0
Unit division: 0
Unit division: 0
Unit division: 0

## 2017-06-01 LAB — CBC WITH DIFFERENTIAL/PLATELET
BASOS PCT: 0 %
Basophils Absolute: 0 10*3/uL (ref 0.0–0.1)
EOS ABS: 0 10*3/uL (ref 0.0–0.7)
EOS PCT: 0 %
HCT: 35.8 % — ABNORMAL LOW (ref 36.0–46.0)
HEMOGLOBIN: 12.3 g/dL (ref 12.0–15.0)
LYMPHS ABS: 0.8 10*3/uL (ref 0.7–4.0)
Lymphocytes Relative: 7 %
MCH: 29 pg (ref 26.0–34.0)
MCHC: 34.4 g/dL (ref 30.0–36.0)
MCV: 84.4 fL (ref 78.0–100.0)
Monocytes Absolute: 0.5 10*3/uL (ref 0.1–1.0)
Monocytes Relative: 4 %
NEUTROS PCT: 89 %
Neutro Abs: 9.6 10*3/uL — ABNORMAL HIGH (ref 1.7–7.7)
PLATELETS: 47 10*3/uL — AB (ref 150–400)
RBC: 4.24 MIL/uL (ref 3.87–5.11)
RDW: 15.2 % (ref 11.5–15.5)
WBC: 10.9 10*3/uL — AB (ref 4.0–10.5)

## 2017-06-01 LAB — BASIC METABOLIC PANEL
Anion gap: 5 (ref 5–15)
CHLORIDE: 114 mmol/L — AB (ref 101–111)
CO2: 20 mmol/L — ABNORMAL LOW (ref 22–32)
CREATININE: 0.42 mg/dL — AB (ref 0.44–1.00)
Calcium: 6.8 mg/dL — ABNORMAL LOW (ref 8.9–10.3)
Glucose, Bld: 112 mg/dL — ABNORMAL HIGH (ref 65–99)
POTASSIUM: 3.3 mmol/L — AB (ref 3.5–5.1)
SODIUM: 139 mmol/L (ref 135–145)

## 2017-06-01 LAB — LACTIC ACID, PLASMA: Lactic Acid, Venous: 1.5 mmol/L (ref 0.5–1.9)

## 2017-06-01 LAB — BLOOD PRODUCT ORDER (VERBAL) VERIFICATION

## 2017-06-01 LAB — HIV ANTIBODY (ROUTINE TESTING W REFLEX): HIV SCREEN 4TH GENERATION: NONREACTIVE

## 2017-06-01 MED ORDER — SODIUM CHLORIDE 0.9 % IV SOLN
Freq: Once | INTRAVENOUS | Status: AC
  Administered 2017-06-01: 03:00:00 via INTRAVENOUS

## 2017-06-01 NOTE — ED Notes (Signed)
RN in CT with pt.  She is beginning to move R arm.  Trying to pull ETT.  Called Dr. Bebe ShaggyWickline and notified him of BP.  Telephone order received to give unit of emergency release blood upon returning to trauma room.  No more sedation at this time due to BP.
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
## 2017-06-01 NOTE — Progress Notes (Signed)
Pt transported to CT from 4N29 and back without incident.

## 2017-06-01 NOTE — Progress Notes (Signed)
External fixator became loose from pins during patient repositioning.  Alerted Ortho MD about situation and was told that it would be fixed in the morning.
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
## 2017-06-01 NOTE — Progress Notes (Signed)
MRI pending Keep collar pending results of MRI.

## 2017-06-02 ENCOUNTER — Encounter (HOSPITAL_COMMUNITY): Payer: Self-pay

## 2017-06-02 ENCOUNTER — Inpatient Hospital Stay (HOSPITAL_COMMUNITY): Payer: No Typology Code available for payment source

## 2017-06-02 DIAGNOSIS — S2243XA Multiple fractures of ribs, bilateral, initial encounter for closed fracture: Secondary | ICD-10-CM

## 2017-06-02 DIAGNOSIS — S42109A Fracture of unspecified part of scapula, unspecified shoulder, initial encounter for closed fracture: Secondary | ICD-10-CM

## 2017-06-02 DIAGNOSIS — S069XAA Unspecified intracranial injury with loss of consciousness status unknown, initial encounter: Secondary | ICD-10-CM

## 2017-06-02 DIAGNOSIS — S069X9A Unspecified intracranial injury with loss of consciousness of unspecified duration, initial encounter: Secondary | ICD-10-CM

## 2017-06-02 HISTORY — DX: Multiple fractures of ribs, bilateral, initial encounter for closed fracture: S22.43XA

## 2017-06-02 HISTORY — DX: Unspecified intracranial injury with loss of consciousness of unspecified duration, initial encounter: S06.9X9A

## 2017-06-02 HISTORY — DX: Fracture of unspecified part of scapula, unspecified shoulder, initial encounter for closed fracture: S42.109A

## 2017-06-02 HISTORY — DX: Unspecified intracranial injury with loss of consciousness status unknown, initial encounter: S06.9XAA

## 2017-06-02 LAB — CBC WITH DIFFERENTIAL/PLATELET
Basophils Absolute: 0 10*3/uL (ref 0.0–0.1)
Basophils Relative: 0 %
EOS ABS: 0.1 10*3/uL (ref 0.0–0.7)
EOS PCT: 1 %
HCT: 33.6 % — ABNORMAL LOW (ref 36.0–46.0)
Hemoglobin: 11.2 g/dL — ABNORMAL LOW (ref 12.0–15.0)
LYMPHS ABS: 1.3 10*3/uL (ref 0.7–4.0)
LYMPHS PCT: 11 %
MCH: 28.6 pg (ref 26.0–34.0)
MCHC: 33.3 g/dL (ref 30.0–36.0)
MCV: 85.7 fL (ref 78.0–100.0)
MONO ABS: 0.4 10*3/uL (ref 0.1–1.0)
Monocytes Relative: 3 %
Neutro Abs: 10.2 10*3/uL — ABNORMAL HIGH (ref 1.7–7.7)
Neutrophils Relative %: 85 %
PLATELETS: 55 10*3/uL — AB (ref 150–400)
RBC: 3.92 MIL/uL (ref 3.87–5.11)
RDW: 15.4 % (ref 11.5–15.5)
WBC: 12 10*3/uL — AB (ref 4.0–10.5)

## 2017-06-02 LAB — BPAM FFP
BLOOD PRODUCT EXPIRATION DATE: 201901012359
BLOOD PRODUCT EXPIRATION DATE: 201901032359
BLOOD PRODUCT EXPIRATION DATE: 201901032359
BLOOD PRODUCT EXPIRATION DATE: 201901052359
Blood Product Expiration Date: 201812312359
Blood Product Expiration Date: 201812312359
Blood Product Expiration Date: 201901012359
Blood Product Expiration Date: 201901012359
Blood Product Expiration Date: 201901012359
Blood Product Expiration Date: 201901032359
Blood Product Expiration Date: 201901032359
Blood Product Expiration Date: 201901052359
ISSUE DATE / TIME: 201812290802
ISSUE DATE / TIME: 201812290802
ISSUE DATE / TIME: 201812300634
ISSUE DATE / TIME: 201812300634
ISSUE DATE / TIME: 201812302140
ISSUE DATE / TIME: 201812302307
ISSUE DATE / TIME: 201812310246
ISSUE DATE / TIME: 201812310246
ISSUE DATE / TIME: 201901010143
ISSUE DATE / TIME: 201901010143
ISSUE DATE / TIME: 201812310427
ISSUE DATE / TIME: 201812310427
UNIT TYPE AND RH: 5100
UNIT TYPE AND RH: 5100
UNIT TYPE AND RH: 5100
UNIT TYPE AND RH: 6200
UNIT TYPE AND RH: 6200
UNIT TYPE AND RH: 6200
UNIT TYPE AND RH: 9500
Unit Type and Rh: 5100
Unit Type and Rh: 6200
Unit Type and Rh: 6200
Unit Type and Rh: 6200
Unit Type and Rh: 5100

## 2017-06-02 LAB — PREPARE FRESH FROZEN PLASMA
UNIT DIVISION: 0
UNIT DIVISION: 0
UNIT DIVISION: 0
UNIT DIVISION: 0
UNIT DIVISION: 0
UNIT DIVISION: 0
Unit division: 0
Unit division: 0
Unit division: 0
Unit division: 0
Unit division: 0
Unit division: 0

## 2017-06-02 LAB — GLUCOSE, CAPILLARY
GLUCOSE-CAPILLARY: 63 mg/dL — AB (ref 65–99)
Glucose-Capillary: 70 mg/dL (ref 65–99)
Glucose-Capillary: 66 mg/dL (ref 65–99)
Glucose-Capillary: 108 mg/dL — ABNORMAL HIGH (ref 65–99)
Glucose-Capillary: 87 mg/dL (ref 65–99)

## 2017-06-02 LAB — CBC
HEMATOCRIT: 33.1 % — AB (ref 36.0–46.0)
HEMOGLOBIN: 11.2 g/dL — AB (ref 12.0–15.0)
MCH: 29 pg (ref 26.0–34.0)
MCHC: 33.8 g/dL (ref 30.0–36.0)
MCV: 85.8 fL (ref 78.0–100.0)
Platelets: 59 10*3/uL — ABNORMAL LOW (ref 150–400)
RBC: 3.86 MIL/uL — ABNORMAL LOW (ref 3.87–5.11)
RDW: 15.2 % (ref 11.5–15.5)
WBC: 11.5 10*3/uL — ABNORMAL HIGH (ref 4.0–10.5)

## 2017-06-02 LAB — BASIC METABOLIC PANEL
Anion gap: 7 (ref 5–15)
CALCIUM: 7.3 mg/dL — AB (ref 8.9–10.3)
CO2: 20 mmol/L — ABNORMAL LOW (ref 22–32)
CREATININE: 0.45 mg/dL (ref 0.44–1.00)
Chloride: 114 mmol/L — ABNORMAL HIGH (ref 101–111)
GFR calc Af Amer: >60 mL/min (ref >60)
GLUCOSE: 83 mg/dL (ref 65–99)
POTASSIUM: 2.9 mmol/L — AB (ref 3.5–5.1)
Sodium: 141 mmol/L (ref 135–145)

## 2017-06-02 LAB — HEPATITIS PANEL, ACUTE
HEP B C IGM: NEGATIVE
HEP B S AG: NEGATIVE
Hep A IgM: NEGATIVE

## 2017-06-02 LAB — TRIGLYCERIDES: TRIGLYCERIDES: 130 mg/dL (ref <150)

## 2017-06-02 MED ORDER — VITAL HIGH PROTEIN PO LIQD
1000.0000 mL | ORAL | Status: DC
  Administered 2017-06-02: 1000 mL
  Filled 2017-06-02 (×2): qty 1000

## 2017-06-02 MED ORDER — METOPROLOL TARTRATE 5 MG/5ML IV SOLN
5.0000 mg | Freq: Four times a day (QID) | INTRAVENOUS | Status: DC | PRN
  Administered 2017-06-02 – 2017-06-21 (×22): 5 mg via INTRAVENOUS
  Filled 2017-06-02 (×24): qty 5

## 2017-06-02 MED ORDER — DEXTROSE 50 % IV SOLN
INTRAVENOUS | Status: AC
  Administered 2017-06-02: 25 mL
  Filled 2017-06-02: qty 50

## 2017-06-02 MED ORDER — POTASSIUM CHLORIDE 10 MEQ/50ML IV SOLN
10.0000 meq | INTRAVENOUS | Status: AC | PRN
  Administered 2017-06-02 (×3): 10 meq via INTRAVENOUS
  Filled 2017-06-02 (×3): qty 50

## 2017-06-02 MED ORDER — POTASSIUM CHLORIDE IN NACL 20-0.9 MEQ/L-% IV SOLN
INTRAVENOUS | Status: DC
  Administered 2017-06-02 – 2017-06-22 (×24): via INTRAVENOUS
  Filled 2017-06-02 (×30): qty 1000

## 2017-06-02 NOTE — Progress Notes (Signed)
No acute events Follows commands when sedation held Lower extremities fixated Collar in good position Keep in collar pending results of cervical MRI

## 2017-06-02 NOTE — Anesthesia Preprocedure Evaluation (Addendum)
Anesthesia Evaluation  Patient identified by MRN, date of birth, ID band Patient unresponsive    Reviewed: Allergy & Precautions, NPO status , Patient's Chart, lab work & pertinent test results  Airway Mallampati: Intubated       Dental  (+) Teeth Intact   Pulmonary neg pulmonary ROS,     + decreased breath sounds      Cardiovascular negative cardio ROS   Rhythm:Regular Rate:Tachycardia     Neuro/Psych negative neurological ROS  negative psych ROS   GI/Hepatic negative GI ROS, Neg liver ROS,   Endo/Other  negative endocrine ROS  Renal/GU negative Renal ROS  negative genitourinary   Musculoskeletal   Abdominal Normal abdominal exam  (+)   Peds  Hematology negative hematology ROS (+)   Anesthesia Other Findings   Reproductive/Obstetrics                           Anesthesia Physical Anesthesia Plan  ASA: II  Anesthesia Plan: General   Post-op Pain Management:    Induction: Intravenous  PONV Risk Score and Plan: 4 or greater and Ondansetron, Dexamethasone, Midazolam and Scopolamine patch - Pre-op  Airway Management Planned: Oral ETT  Additional Equipment: None  Intra-op Plan:   Post-operative Plan: Extubation in OR  Informed Consent: I have reviewed the patients History and Physical, chart, labs and discussed the procedure including the risks, benefits and alternatives for the proposed anesthesia with the patient or authorized representative who has indicated his/her understanding and acceptance.   Dental advisory given  Plan Discussed with: CRNA  Anesthesia Plan Comments:         Anesthesia Quick Evaluation
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
## 2017-06-03 ENCOUNTER — Inpatient Hospital Stay (HOSPITAL_COMMUNITY): Payer: No Typology Code available for payment source

## 2017-06-03 ENCOUNTER — Encounter (HOSPITAL_COMMUNITY): Payer: Self-pay | Admitting: Radiology

## 2017-06-03 ENCOUNTER — Encounter (HOSPITAL_COMMUNITY): Admission: EM | Disposition: A | Discharge status: Rehabilitation Facility | Payer: Self-pay | Source: Home / Self Care

## 2017-06-03 ENCOUNTER — Inpatient Hospital Stay (HOSPITAL_COMMUNITY): Payer: No Typology Code available for payment source | Admitting: Certified Registered"

## 2017-06-03 DIAGNOSIS — M79661 Pain in right lower leg: Secondary | ICD-10-CM | POA: Diagnosis not present

## 2017-06-03 DIAGNOSIS — S82251C Displaced comminuted fracture of shaft of right tibia, initial encounter for open fracture type IIIA, IIIB, or IIIC: Secondary | ICD-10-CM | POA: Diagnosis not present

## 2017-06-03 HISTORY — PX: ORIF PELVIC FRACTURE: SHX2128

## 2017-06-03 HISTORY — PX: INCISION AND DRAINAGE OF WOUND: SHX1803

## 2017-06-03 LAB — BASIC METABOLIC PANEL
Anion gap: 6 (ref 5–15)
CALCIUM: 7.5 mg/dL — AB (ref 8.9–10.3)
CO2: 22 mmol/L (ref 22–32)
CREATININE: 0.43 mg/dL — AB (ref 0.44–1.00)
Chloride: 109 mmol/L (ref 101–111)
GFR calc non Af Amer: >60 mL/min (ref >60)
GLUCOSE: 95 mg/dL (ref 65–99)
Potassium: 3.1 mmol/L — ABNORMAL LOW (ref 3.5–5.1)
Sodium: 137 mmol/L (ref 135–145)

## 2017-06-03 LAB — GLUCOSE, CAPILLARY
Glucose-Capillary: 138 mg/dL — ABNORMAL HIGH (ref 65–99)
Glucose-Capillary: 147 mg/dL — ABNORMAL HIGH (ref 65–99)
Glucose-Capillary: 71 mg/dL (ref 65–99)
Glucose-Capillary: 79 mg/dL (ref 65–99)
Glucose-Capillary: 97 mg/dL (ref 65–99)

## 2017-06-03 LAB — CBC
HCT: 36.1 % (ref 36.0–46.0)
Hemoglobin: 12.1 g/dL (ref 12.0–15.0)
MCH: 28.8 pg (ref 26.0–34.0)
MCHC: 33.5 g/dL (ref 30.0–36.0)
MCV: 86 fL (ref 78.0–100.0)
Platelets: 80 10*3/uL — ABNORMAL LOW (ref 150–400)
RBC: 4.2 MIL/uL (ref 3.87–5.11)
RDW: 15 % (ref 11.5–15.5)
WBC: 9.4 10*3/uL (ref 4.0–10.5)

## 2017-06-03 LAB — BPAM PLATELET PHERESIS
Blood Product Expiration Date: 201901032359
ISSUE DATE / TIME: 201901021028
Unit Type and Rh: 6200

## 2017-06-03 LAB — PREPARE PLATELET PHERESIS: Unit division: 0

## 2017-06-03 LAB — POCT I-STAT 3, ART BLOOD GAS (G3+)
ACID-BASE DEFICIT: 6 mmol/L — AB (ref 0.0–2.0)
BICARBONATE: 19.6 mmol/L — AB (ref 20.0–28.0)
O2 Saturation: 95 %
TCO2: 21 mmol/L — ABNORMAL LOW (ref 22–32)
pCO2 arterial: 35.1 mmHg (ref 32.0–48.0)
pH, Arterial: 7.34 — ABNORMAL LOW (ref 7.350–7.450)
pO2, Arterial: 69 mmHg — ABNORMAL LOW (ref 83.0–108.0)

## 2017-06-03 LAB — PREPARE RBC (CROSSMATCH)

## 2017-06-03 LAB — MAGNESIUM: Magnesium: 1.4 mg/dL — ABNORMAL LOW (ref 1.7–2.4)

## 2017-06-03 SURGERY — OPEN REDUCTION INTERNAL FIXATION (ORIF) PELVIC FRACTURE
Anesthesia: General | Site: Leg Lower | Laterality: Right

## 2017-06-03 MED ORDER — ALBUMIN HUMAN 5 % IV SOLN
INTRAVENOUS | Status: DC | PRN
  Administered 2017-06-03: 10:00:00 via INTRAVENOUS

## 2017-06-03 MED ORDER — PROPOFOL 10 MG/ML IV BOLUS
INTRAVENOUS | Status: AC
  Filled 2017-06-03: qty 20

## 2017-06-03 MED ORDER — SODIUM CHLORIDE 0.9 % IR SOLN
Status: DC | PRN
  Administered 2017-06-03: 3000 mL

## 2017-06-03 MED ORDER — ONDANSETRON HCL 4 MG/2ML IJ SOLN
INTRAMUSCULAR | Status: DC | PRN
  Administered 2017-06-03: 4 mg via INTRAVENOUS

## 2017-06-03 MED ORDER — DEXAMETHASONE SODIUM PHOSPHATE 10 MG/ML IJ SOLN
INTRAMUSCULAR | Status: AC
  Filled 2017-06-03: qty 1

## 2017-06-03 MED ORDER — TOBRAMYCIN SULFATE 1.2 G IJ SOLR
INTRAMUSCULAR | Status: AC
  Filled 2017-06-03: qty 1.2

## 2017-06-03 MED ORDER — ALBUTEROL SULFATE HFA 108 (90 BASE) MCG/ACT IN AERS
INHALATION_SPRAY | RESPIRATORY_TRACT | Status: DC | PRN
  Administered 2017-06-03: 4 via RESPIRATORY_TRACT
  Administered 2017-06-03: 2 via RESPIRATORY_TRACT

## 2017-06-03 MED ORDER — VANCOMYCIN HCL IN DEXTROSE 1-5 GM/200ML-% IV SOLN
1000.0000 mg | Freq: Two times a day (BID) | INTRAVENOUS | Status: DC
  Administered 2017-06-03 – 2017-06-05 (×4): 1000 mg via INTRAVENOUS
  Filled 2017-06-03 (×6): qty 200

## 2017-06-03 MED ORDER — VANCOMYCIN HCL 1000 MG IV SOLR
INTRAVENOUS | Status: DC | PRN
  Administered 2017-06-03: 1000 mg via TOPICAL
  Administered 2017-06-03: 1000 mg

## 2017-06-03 MED ORDER — IOPAMIDOL (ISOVUE-370) INJECTION 76%
INTRAVENOUS | Status: AC
  Administered 2017-06-03: 100 mL
  Filled 2017-06-03: qty 100

## 2017-06-03 MED ORDER — TRANEXAMIC ACID 1000 MG/10ML IV SOLN
2000.0000 mg | INTRAVENOUS | Status: DC
  Filled 2017-06-03: qty 20

## 2017-06-03 MED ORDER — VANCOMYCIN HCL 1000 MG IV SOLR
INTRAVENOUS | Status: AC
Start: 2017-06-03 — End: 2017-06-03
  Filled 2017-06-03: qty 1000

## 2017-06-03 MED ORDER — SODIUM CHLORIDE 0.9 % IV SOLN
INTRAVENOUS | Status: DC | PRN
  Administered 2017-06-03: 09:00:00 via INTRAVENOUS

## 2017-06-03 MED ORDER — ONDANSETRON HCL 4 MG/2ML IJ SOLN
INTRAMUSCULAR | Status: AC
  Filled 2017-06-03: qty 2

## 2017-06-03 MED ORDER — 0.9 % SODIUM CHLORIDE (POUR BTL) OPTIME
TOPICAL | Status: DC | PRN
  Administered 2017-06-03: 1000 mL

## 2017-06-03 MED ORDER — ROCURONIUM BROMIDE 10 MG/ML (PF) SYRINGE
PREFILLED_SYRINGE | INTRAVENOUS | Status: AC
  Filled 2017-06-03: qty 25

## 2017-06-03 MED ORDER — MIDAZOLAM HCL 2 MG/2ML IJ SOLN
INTRAMUSCULAR | Status: AC
  Filled 2017-06-03: qty 2

## 2017-06-03 MED ORDER — PIVOT 1.5 CAL PO LIQD
1000.0000 mL | ORAL | Status: DC
  Administered 2017-06-03: 1000 mL

## 2017-06-03 MED ORDER — TOBRAMYCIN SULFATE 1.2 G IJ SOLR
INTRAMUSCULAR | Status: DC | PRN
  Administered 2017-06-03: 1.2 g via TOPICAL
  Administered 2017-06-03: 1.2 g

## 2017-06-03 MED ORDER — LACTATED RINGERS IV SOLN
INTRAVENOUS | Status: DC | PRN
  Administered 2017-06-03: 09:00:00 via INTRAVENOUS

## 2017-06-03 MED ORDER — MIDAZOLAM HCL 2 MG/2ML IJ SOLN
INTRAMUSCULAR | Status: DC | PRN
  Administered 2017-06-03: 2 mg via INTRAVENOUS

## 2017-06-03 MED ORDER — ENOXAPARIN SODIUM 30 MG/0.3ML ~~LOC~~ SOLN
30.0000 mg | Freq: Two times a day (BID) | SUBCUTANEOUS | Status: DC
  Administered 2017-06-03 – 2017-06-24 (×40): 30 mg via SUBCUTANEOUS
  Filled 2017-06-03 (×42): qty 0.3

## 2017-06-03 MED ORDER — VANCOMYCIN HCL 1000 MG IV SOLR
INTRAVENOUS | Status: AC
  Filled 2017-06-03: qty 1000

## 2017-06-03 MED ORDER — POTASSIUM CHLORIDE 10 MEQ/50ML IV SOLN
10.0000 meq | INTRAVENOUS | Status: DC
  Administered 2017-06-03 (×2): 10 meq via INTRAVENOUS
  Filled 2017-06-03 (×2): qty 50

## 2017-06-03 MED ORDER — DEXAMETHASONE SODIUM PHOSPHATE 10 MG/ML IJ SOLN
INTRAMUSCULAR | Status: DC | PRN
  Administered 2017-06-03: 10 mg via INTRAVENOUS

## 2017-06-03 MED ORDER — ROCURONIUM BROMIDE 10 MG/ML (PF) SYRINGE
PREFILLED_SYRINGE | INTRAVENOUS | Status: DC | PRN
  Administered 2017-06-03: 100 mg via INTRAVENOUS
  Administered 2017-06-03 (×4): 50 mg via INTRAVENOUS

## 2017-06-03 SURGICAL SUPPLY — 80 items
BANDAGE ACE 4X5 VEL STRL LF (GAUZE/BANDAGES/DRESSINGS) ×3 IMPLANT
BANDAGE ACE 6X5 VEL STRL LF (GAUZE/BANDAGES/DRESSINGS) ×3 IMPLANT
BIT DRILL CANN 4.5MM (BIT) ×2 IMPLANT
BIT DRILL CANN LRG QC 5X300 (BIT) ×3 IMPLANT
BLADE CLIPPER SURG (BLADE) ×3 IMPLANT
BNDG COHESIVE 6X5 TAN STRL LF (GAUZE/BANDAGES/DRESSINGS) ×3 IMPLANT
BOWL SMART MIX CTS (DISPOSABLE) ×3 IMPLANT
CANISTER WOUNDNEG PRESSURE 500 (CANNISTER) ×3 IMPLANT
CEMENT BONE R 1X40 (Cement) ×6 IMPLANT
CHLORAPREP W/TINT 26ML (MISCELLANEOUS) ×6 IMPLANT
COVER SURGICAL LIGHT HANDLE (MISCELLANEOUS) ×3 IMPLANT
DERMABOND ADVANCED (GAUZE/BANDAGES/DRESSINGS) ×3
DERMABOND ADVANCED .7 DNX12 (GAUZE/BANDAGES/DRESSINGS) ×6 IMPLANT
DRAIN CHANNEL 15F RND FF W/TCR (WOUND CARE) IMPLANT
DRAPE C-ARM 42X72 X-RAY (DRAPES) ×3 IMPLANT
DRAPE C-ARMOR (DRAPES) ×3 IMPLANT
DRAPE EXTREMITY T 121X128X90 (DRAPE) ×3 IMPLANT
DRAPE INCISE IOBAN 66X45 STRL (DRAPES) ×9 IMPLANT
DRAPE PROXIMA HALF (DRAPES) ×6 IMPLANT
DRAPE SURG 17X23 STRL (DRAPES) ×18 IMPLANT
DRAPE U-SHAPE 47X51 STRL (DRAPES) ×6 IMPLANT
DRAPE UNIVERSAL PACK (DRAPES) ×3 IMPLANT
DRESSING ADAPTIC 1/2  N-ADH (PACKING) ×3 IMPLANT
DRESSING PEEL AND PLAC PRVNA20 (GAUZE/BANDAGES/DRESSINGS) IMPLANT
DRILL BIT CANN 4.5MM (BIT) ×1
DRSG ADAPTIC 3X8 NADH LF (GAUZE/BANDAGES/DRESSINGS) ×3 IMPLANT
DRSG MEPILEX BORDER 4X4 (GAUZE/BANDAGES/DRESSINGS) ×9 IMPLANT
DRSG MEPILEX BORDER 4X8 (GAUZE/BANDAGES/DRESSINGS) IMPLANT
DRSG PEEL AND PLACE PREVENA 20 (GAUZE/BANDAGES/DRESSINGS)
DRSG VAC ATS MED SENSATRAC (GAUZE/BANDAGES/DRESSINGS) ×3 IMPLANT
DRSG VERSA FOAM LRG 10X15 (GAUZE/BANDAGES/DRESSINGS) ×3 IMPLANT
ELECT REM PT RETURN 9FT ADLT (ELECTROSURGICAL) ×3
ELECTRODE REM PT RTRN 9FT ADLT (ELECTROSURGICAL) ×2 IMPLANT
EVACUATOR SILICONE 100CC (DRAIN) IMPLANT
GLOVE BIO SURGEON STRL SZ7.5 (GLOVE) ×21 IMPLANT
GLOVE BIOGEL PI IND STRL 7.5 (GLOVE) ×8 IMPLANT
GLOVE BIOGEL PI INDICATOR 7.5 (GLOVE) ×4
GOWN STRL REUS W/ TWL LRG LVL3 (GOWN DISPOSABLE) ×8 IMPLANT
GOWN STRL REUS W/TWL LRG LVL3 (GOWN DISPOSABLE) ×4
GUIDEWIRE 2.0MM (WIRE) ×9 IMPLANT
GUIDEWIRE THREADED 2.8 (WIRE) ×9 IMPLANT
GUIDEWIRE THREADED 2.8MM (WIRE) ×3 IMPLANT
HANDPIECE INTERPULSE COAX TIP (DISPOSABLE) ×1
IV NS IRRIG 3000ML ARTHROMATIC (IV SOLUTION) ×6 IMPLANT
KIT BASIN OR (CUSTOM PROCEDURE TRAY) ×3 IMPLANT
KIT ROOM TURNOVER OR (KITS) ×3 IMPLANT
MANIFOLD NEPTUNE II (INSTRUMENTS) ×3 IMPLANT
NS IRRIG 1000ML POUR BTL (IV SOLUTION) ×3 IMPLANT
PACK TOTAL JOINT (CUSTOM PROCEDURE TRAY) ×3 IMPLANT
PAD ARMBOARD 7.5X6 YLW CONV (MISCELLANEOUS) ×6 IMPLANT
SCREW BONE CANN 6.5X65 (Screw) ×3 IMPLANT
SCREW BONE CANN 7.3X70 (Screw) ×3 IMPLANT
SCREW CANN 7.3X140MM (Screw) ×3 IMPLANT
SCREW CANN 7.3X85MM FULL THRED (Screw) ×2 IMPLANT
SCREWCANN 7.3X85MM FULL THREAD (Screw) ×3 IMPLANT
SET HNDPC FAN SPRY TIP SCT (DISPOSABLE) ×2 IMPLANT
SPONGE LAP 18X18 X RAY DECT (DISPOSABLE) ×3 IMPLANT
STAPLER VISISTAT 35W (STAPLE) ×3 IMPLANT
SUCTION FRAZIER HANDLE 10FR (MISCELLANEOUS) ×1
SUCTION TUBE FRAZIER 10FR DISP (MISCELLANEOUS) ×2 IMPLANT
SUT ETHILON 2 0 FS 18 (SUTURE) ×6 IMPLANT
SUT ETHILON 3 0 PS 1 (SUTURE) ×3 IMPLANT
SUT ETHILON O TP 1 (SUTURE) ×3 IMPLANT
SUT MNCRL AB 3-0 PS2 18 (SUTURE) ×6 IMPLANT
SUT MON AB 2-0 CT1 36 (SUTURE) ×3 IMPLANT
SUT PROLENE 0 CT 1 30 (SUTURE) ×3 IMPLANT
SUT VIC AB 0 CT1 27 (SUTURE) ×2
SUT VIC AB 0 CT1 27XBRD ANBCTR (SUTURE) ×4 IMPLANT
SUT VIC AB 1 CT1 18XCR BRD 8 (SUTURE) IMPLANT
SUT VIC AB 1 CT1 8-18 (SUTURE)
SUT VIC AB 2-0 CT1 27 (SUTURE) ×2
SUT VIC AB 2-0 CT1 TAPERPNT 27 (SUTURE) ×4 IMPLANT
TOWEL OR 17X24 6PK STRL BLUE (TOWEL DISPOSABLE) ×3 IMPLANT
TOWEL OR 17X26 10 PK STRL BLUE (TOWEL DISPOSABLE) ×6 IMPLANT
TRAY FOLEY W/METER SILVER 16FR (SET/KITS/TRAYS/PACK) IMPLANT
TUBE CONNECTING 12X1/4 (SUCTIONS) ×3 IMPLANT
WASHER FOR 5.0 SCREWS (Washer) ×9 IMPLANT
WATER STERILE IRR 1000ML POUR (IV SOLUTION) IMPLANT
WIRE G 1.6X150 SPADE (MISCELLANEOUS) ×3 IMPLANT
YANKAUER SUCT BULB TIP NO VENT (SUCTIONS) ×3 IMPLANT

## 2017-06-03 NOTE — Transfer of Care (Signed)
Immediate Anesthesia Transfer of Care Note  Patient: Briana [name]  Procedure(s) Performed: OPEN REDUCTION INTERNAL FIXATION (ORIF) PELVIC FRACTURE (N/A ) IRRIGATION AND DEBRIDEMENT WOUND (Right Leg Lower)  Patient Location: ICU  Anesthesia Type:General  Level of Consciousness: sedated  Airway & Oxygen Therapy: Patient remains intubated per anesthesia plan and Patient placed on Ventilator (see vital sign flow sheet for setting)  Post-op Assessment: Report given to RN and Post -op Vital signs reviewed and stable  Post vital signs: Reviewed and stable  Last Vitals:  Vitals:   06/03/17 0800 06/03/17 0807  BP: 107/69 121/71  Pulse: (!) 133 (!) 137  Resp: (!) 25   Temp: 37 C   SpO2: 94% 96%    Last Pain:  Vitals:   06/02/17 2000  TempSrc: Core (Comment)  PainSc:          Complications: No apparent anesthesia complications
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
## 2017-06-03 NOTE — Progress Notes (Signed)
MRI still pending Will await results of MRI Keep in collar
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
## 2017-06-03 NOTE — Anesthesia Postprocedure Evaluation (Signed)
Anesthesia Post Note  Patient: [name]  Procedure(s) Performed: OPEN REDUCTION INTERNAL FIXATION (ORIF) PELVIC FRACTURE (N/A ) IRRIGATION AND DEBRIDEMENT WOUND (Right Leg Lower)     Patient location during evaluation: NICU Anesthesia Type: General Level of consciousness: patient remains intubated per anesthesia plan Vital Signs Assessment: post-procedure vital signs reviewed and stable Respiratory status: patient on ventilator - see flowsheet for VS and respiratory function unstable Cardiovascular status: blood pressure returned to baseline Anesthetic complications: no    Last Vitals:  Vitals:   06/03/17 0800 06/03/17 0807  BP: 107/69 121/71  Pulse: (!) 133 (!) 137  Resp: (!) 25   Temp: 37 C   SpO2: 94% 96%    Last Pain:  Vitals:   06/02/17 2000  TempSrc: Core (Comment)  PainSc:                  Dell Briner COKER
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
## 2017-06-03 NOTE — OR Nursing (Signed)
19 beads implanted in right leg.

## 2017-06-03 NOTE — Progress Notes (Signed)
Pt's temp was 93.6 via temp foley and via rectal temp for verification.  Pt has been on bear hugger since the OR.  Notified Dr. Lindie SpruceWyatt.  Orders to apply cooling/warming blanket on top of bear hugger.  Warming blanket applied just before 1700.  Pt's temp is now 95.9 via temp foley.
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
## 2017-06-03 NOTE — Progress Notes (Signed)
Small clonically insignificant RUL PE.  Dense RML and RLL consolidation or contusion.  CT okay.  Will place on Lovenox.  Marta LamasJames O. Gae BonWyatt, III, MD, FACS [phone] Trauma Surgeon

## 2017-06-04 ENCOUNTER — Inpatient Hospital Stay (HOSPITAL_COMMUNITY): Payer: No Typology Code available for payment source

## 2017-06-04 ENCOUNTER — Encounter (HOSPITAL_COMMUNITY): Payer: Self-pay | Admitting: Orthopedic Surgery

## 2017-06-04 DIAGNOSIS — S82251C Displaced comminuted fracture of shaft of right tibia, initial encounter for open fracture type IIIA, IIIB, or IIIC: Secondary | ICD-10-CM

## 2017-06-04 DIAGNOSIS — S82142A Displaced bicondylar fracture of left tibia, initial encounter for closed fracture: Secondary | ICD-10-CM

## 2017-06-04 DIAGNOSIS — S32591A Other specified fracture of right pubis, initial encounter for closed fracture: Secondary | ICD-10-CM

## 2017-06-04 DIAGNOSIS — S7291XA Unspecified fracture of right femur, initial encounter for closed fracture: Secondary | ICD-10-CM

## 2017-06-04 DIAGNOSIS — S82871A Displaced pilon fracture of right tibia, initial encounter for closed fracture: Secondary | ICD-10-CM

## 2017-06-04 DIAGNOSIS — S92109A Unspecified fracture of unspecified talus, initial encounter for closed fracture: Secondary | ICD-10-CM

## 2017-06-04 DIAGNOSIS — S36113A Laceration of liver, unspecified degree, initial encounter: Secondary | ICD-10-CM

## 2017-06-04 DIAGNOSIS — S32592A Other specified fracture of left pubis, initial encounter for closed fracture: Secondary | ICD-10-CM

## 2017-06-04 DIAGNOSIS — J942 Hemothorax: Secondary | ICD-10-CM

## 2017-06-04 LAB — CBC WITH DIFFERENTIAL/PLATELET
BAND NEUTROPHILS: 0 %
Basophils Absolute: 0 10*3/uL (ref 0.0–0.1)
Basophils Relative: 0 %
Blasts: 0 %
EOS ABS: 0 10*3/uL (ref 0.0–0.7)
Eosinophils Relative: 0 %
HCT: 33.2 % — ABNORMAL LOW (ref 36.0–46.0)
Hemoglobin: 11.4 g/dL — ABNORMAL LOW (ref 12.0–15.0)
Lymphocytes Relative: 2 %
Lymphs Abs: 0.4 10*3/uL — ABNORMAL LOW (ref 0.7–4.0)
MCH: 29.5 pg (ref 26.0–34.0)
MCHC: 34.3 g/dL (ref 30.0–36.0)
MCV: 85.8 fL (ref 78.0–100.0)
METAMYELOCYTES PCT: 0 %
MONO ABS: 0.4 10*3/uL (ref 0.1–1.0)
MYELOCYTES: 0 %
Monocytes Relative: 2 %
Neutro Abs: 19.4 10*3/uL — ABNORMAL HIGH (ref 1.7–7.7)
Neutrophils Relative %: 96 %
Other: 0 %
PROMYELOCYTES ABS: 0 %
Platelets: 146 10*3/uL — ABNORMAL LOW (ref 150–400)
RBC: 3.87 MIL/uL (ref 3.87–5.11)
RDW: 15.4 % (ref 11.5–15.5)
WBC MORPHOLOGY: INCREASED
WBC: 20.2 10*3/uL — ABNORMAL HIGH (ref 4.0–10.5)
nRBC: 0 /100 WBC

## 2017-06-04 LAB — BASIC METABOLIC PANEL
ANION GAP: 4 — AB (ref 5–15)
CALCIUM: 7.3 mg/dL — AB (ref 8.9–10.3)
CO2: 26 mmol/L (ref 22–32)
Chloride: 113 mmol/L — ABNORMAL HIGH (ref 101–111)
Creatinine, Ser: 0.36 mg/dL — ABNORMAL LOW (ref 0.44–1.00)
GFR calc Af Amer: >60 mL/min (ref >60)
Glucose, Bld: 162 mg/dL — ABNORMAL HIGH (ref 65–99)
Potassium: 3.6 mmol/L (ref 3.5–5.1)
SODIUM: 143 mmol/L (ref 135–145)

## 2017-06-04 LAB — GLUCOSE, CAPILLARY
GLUCOSE-CAPILLARY: 168 mg/dL — AB (ref 65–99)
Glucose-Capillary: 105 mg/dL — ABNORMAL HIGH (ref 65–99)
Glucose-Capillary: 115 mg/dL — ABNORMAL HIGH (ref 65–99)
Glucose-Capillary: 140 mg/dL — ABNORMAL HIGH (ref 65–99)
Glucose-Capillary: 149 mg/dL — ABNORMAL HIGH (ref 65–99)
Glucose-Capillary: 150 mg/dL — ABNORMAL HIGH (ref 65–99)

## 2017-06-04 LAB — BLOOD GAS, ARTERIAL
Acid-base deficit: 1.8 mmol/L (ref 0.0–2.0)
Bicarbonate: 22.7 mmol/L (ref 20.0–28.0)
Drawn by: 418751
FIO2: 80
LHR: 20 {breaths}/min
MECHVT: 470 mL
O2 SAT: 98.5 %
PATIENT TEMPERATURE: 98.6
PCO2 ART: 40.6 mmHg (ref 32.0–48.0)
PEEP: 12 cmH2O
pH, Arterial: 7.367 (ref 7.350–7.450)
pO2, Arterial: 126 mmHg — ABNORMAL HIGH (ref 83.0–108.0)

## 2017-06-04 LAB — POCT I-STAT 3, ART BLOOD GAS (G3+)
ACID-BASE DEFICIT: 2 mmol/L (ref 0.0–2.0)
BICARBONATE: 22.5 mmol/L (ref 20.0–28.0)
O2 SAT: 90 %
TCO2: 24 mmol/L (ref 22–32)
pCO2 arterial: 38.4 mmHg (ref 32.0–48.0)
pH, Arterial: 7.376 (ref 7.350–7.450)
pO2, Arterial: 60 mmHg — ABNORMAL LOW (ref 83.0–108.0)

## 2017-06-04 MED ORDER — LIDOCAINE HCL (PF) 1 % IJ SOLN
INTRAMUSCULAR | Status: AC
  Filled 2017-06-04: qty 30

## 2017-06-04 MED ORDER — LIDOCAINE HCL (PF) 1 % IJ SOLN
INTRAMUSCULAR | Status: AC
  Administered 2017-06-04: 5 mL
  Filled 2017-06-04: qty 5

## 2017-06-04 MED ORDER — PIVOT 1.5 CAL PO LIQD
1000.0000 mL | ORAL | Status: DC
  Administered 2017-06-04 – 2017-06-05 (×2): 1000 mL
  Filled 2017-06-04: qty 1000
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
## 2017-06-04 NOTE — Progress Notes (Signed)
Follow up - Trauma and Critical Care  Patient Details:    Briana [name] is an 22 y.o. female.  Lines/tubes : Airway 7 mm (Active)  Secured at (cm) 21 cm 06/04/2017  8:27 AM  Measured From Lips 06/04/2017  8:27 AM  Secured Location Left 06/04/2017  8:27 AM  Secured By [employer] 06/04/2017  8:27 AM  Tube Holder Repositioned Yes 06/04/2017  8:27 AM  Cuff Pressure (cm H2O) 26 cm H2O 06/04/2017  8:27 AM  Site Condition Dry 06/04/2017  8:27 AM     CVC Triple Lumen 05/31/17 Right Subclavian (Active)  Indication for Insertion or Continuance of Line Limited venous access - need for IV therapy >5 days (PICC only) 06/03/2017  8:00 PM  Site Assessment Clean;Dry;Intact 06/03/2017  8:00 PM  Proximal Lumen Status Infusing 06/03/2017  8:00 PM  Medial Lumen Status Infusing 06/03/2017  8:00 PM  Distal Lumen Status In-line blood sampling system in place 06/03/2017  8:00 PM  Dressing Type Transparent 06/03/2017  8:00 PM  Dressing Status Clean;Dry;Intact 06/03/2017  8:00 PM  Dressing Intervention Other (Comment) 06/01/2017  8:00 PM  Dressing Change Due 06/08/17 06/03/2017  8:00 PM     Arterial Line 05/31/17 Right Brachial (Active)  Site Assessment Bleeding;Draining 06/03/2017  8:00 PM  Line Status Pulsatile blood flow 06/03/2017  8:00 PM  Art Line Waveform Appropriate 06/03/2017  8:00 PM  Art Line Interventions Zeroed and calibrated;Connections checked and tightened 06/03/2017  8:00 PM  Color/Movement/Sensation Capillary refill less than 3 sec 06/03/2017  8:00 PM  Dressing Type Transparent 06/03/2017  8:00 PM  Dressing Status Old drainage;Antimicrobial disc in place 06/03/2017  8:00 PM  Interventions Dressing changed 06/02/2017  3:00 AM  Dressing Change Due 06/09/17 06/03/2017  8:00 PM     Chest Tube 1 Right;Anterior;Lateral 14 Fr. (Active)  Suction -20 cm H2O 06/03/2017  8:00 PM  Chest Tube Air Leak None 06/03/2017  8:00 PM  Patency Intervention Milked;Tip/tilt 06/03/2017  8:00 AM  Drainage Description Sanguineous 06/03/2017  8:00 PM   Dressing Status Clean;Dry;Intact 06/03/2017  8:00 PM  Site Assessment Clean;Dry;Intact 06/03/2017  8:00 PM  Surrounding Skin Unable to view 06/03/2017  8:00 PM  Output (mL) 110 mL 06/04/2017  6:00 AM     Negative Pressure Wound Therapy Leg Anterior;Lower;Right (Active)  Site / Wound Assessment Dressing in place / Unable to assess 06/03/2017  8:00 PM  Cycle Continuous 06/03/2017  8:00 PM  Dressing Status Intact 06/03/2017  8:00 PM  Drainage Amount Moderate 06/03/2017  8:00 PM  Output (mL) 50 mL 06/04/2017  6:00 AM     NG/OG Tube Orogastric Center mouth (Active)  Site Assessment Clean;Dry;Intact 06/03/2017  8:00 PM  Ongoing Placement Verification No change in respiratory status;No acute changes, not attributed to clinical condition 06/03/2017  8:00 PM  Status Infusing tube feed 06/03/2017  8:00 PM  Drainage Appearance Bile 06/02/2017  8:00 AM  Output (mL) 140 mL 06/02/2017  6:00 PM     Urethral Catheter (Active)  Indication for Insertion or Continuance of Catheter Other (comment) 06/03/2017  8:00 PM  Site Assessment Clean;Intact 06/03/2017  8:00 PM  Catheter Maintenance Bag below level of bladder;Catheter secured;Drainage bag/tubing not touching floor;Seal intact;No dependent loops;Insertion date on drainage bag 06/03/2017  8:00 PM  Collection Container Standard drainage bag 06/03/2017  8:00 PM  Securement Method Securing device (Describe) 06/03/2017  8:00 PM  Urinary Catheter Interventions Unclamped 06/03/2017  8:00 PM  Output (mL) 975 mL 06/04/2017  6:00 AM  Microbiology/Sepsis markers: Results for orders placed or performed during the hospital encounter of 05/30/17  MRSA PCR Screening     Status: None   Collection Time: 05/30/17  1:58 PM  Result Value Ref Range Status   MRSA by PCR NEGATIVE NEGATIVE Final    Comment:        The GeneXpert MRSA Assay (FDA approved for NASAL specimens only), is one component of a comprehensive MRSA colonization surveillance program. It is not intended to diagnose MRSA infection  nor to guide or monitor treatment for MRSA infections.     Anti-infectives:  Anti-infectives (From admission, onward)   Start     Dose/Rate Route Frequency Ordered Stop   06/03/17 1600  vancomycin (VANCOCIN) IVPB 1000 mg/200 mL premix     1,000 mg 200 mL/hr over 60 Minutes Intravenous Every 12 hours 06/03/17 1417     06/03/17 1226  tobramycin (NEBCIN) powder  Status:  Discontinued       As needed 06/03/17 1227 06/03/17 1326   06/03/17 1225  vancomycin (VANCOCIN) powder  Status:  Discontinued       As needed 06/03/17 1226 06/03/17 1326   05/30/17 0930  ceFAZolin (ANCEF) IVPB 1 g/50 mL premix  Status:  Discontinued     1 g 100 mL/hr over 30 Minutes Intravenous Every 8 hours 05/30/17 0917 06/03/17 1417   05/30/17 0600  ceFAZolin (ANCEF) IVPB 1 g/50 mL premix     1 g 100 mL/hr over 30 Minutes Intravenous  Once 05/30/17 0556 05/30/17 1610      Best Practice/Protocols:  VTE Prophylaxis: Lovenox (prophylaxtic dose) and Mechanical GI Prophylaxis: Proton Pump Inhibitor Continous Sedation  Consults: Treatment Team:  Md, Trauma, MD Haddix, Gillie Manners, MD Ditty, Loura Halt, MD    Events:  Subjective:    Overnight Issues: Patient had improved overnight, but got a call this AM that  Her right pneumothorax had expanded to 40%.  Upon inspection at the bedside it was noted that the suction on the PleuroVac was not sorking properly and no negative pressure was being applied.    The tube was flushed, the cannister was fixed, and suction was reapplied and the pneumothorax clinically evacuated with vigorous bubbling at the bedside.  CXR is pending.  Objective:  Vital signs for last 24 hours: Temp:  [92.7 F (33.7 C)-100.6 F (38.1 C)] 98.4 F (36.9 C) (01/03 0827) Pulse Rate:  [59-111] 73 (01/03 0827) Resp:  [14-22] 20 (01/03 0827) BP: (89-114)/(54-93) 106/71 (01/03 0827) SpO2:  [93 %-100 %] 95 % (01/03 0827) Arterial Line BP: (94-135)/(47-87) 112/62 (01/03 0700) FiO2 (%):  [60  %-100 %] 60 % (01/03 0827) Weight:  [66.7 kg (147 lb 0.8 oz)] 66.7 kg (147 lb 0.8 oz) (01/03 0400)  Hemodynamic parameters for last 24 hours: CVP:  [8 mmHg-23 mmHg] 15 mmHg  Intake/Output from previous day: 01/02 0701 - 01/03 0700 In: 6148.9 [I.V.:5196.3; NG/GT:302.7; IV Piggyback:650] Out: 5520 [Urine:4900; Drains:150; Blood:60; Chest Tube:410]  Intake/Output this shift: Total I/O In: 58.1 [I.V.:58.1] Out: -   Vent settings for last 24 hours: Vent Mode: PRVC FiO2 (%):  [60 %-100 %] 60 % Set Rate:  [20 bmp] 20 bmp Vt Set:  [470 mL] 470 mL PEEP:  [12 cmH20] 12 cmH20 Plateau Pressure:  [21 cmH20-25 cmH20] 25 cmH20  Physical Exam:  General: no respiratory distress and sedated Neuro: nonfocal exam and RASS -2 Resp: diminished breath sounds RLL, RML and RUL CVS: regular rate and rhythm, S1, S2 normal, no murmur, click,  rub or gallop GI: soft and toleratign tube feedings well. Extremities: Orthopedic injuries have been fixed.  Results for orders placed or performed during the hospital encounter of 05/30/17 (from the past 24 hour(s))  Type and screen [facility]     Status: None (Preliminary result)   Collection Time: 06/03/17  9:40 AM  Result Value Ref Range   ABO/RH(D) O POS    Antibody Screen NEG    Sample Expiration 06/06/2017    Unit Number [id number]    Blood Component Type RED CELLS,LR    Unit division 00    Status of Unit REL FROM [facility]    Transfusion Status OK TO TRANSFUSE    Crossmatch Result Compatible    Unit Number [id number]    Blood Component Type RED CELLS,LR    Unit division 00    Status of Unit REL FROM [facility]    Transfusion Status OK TO TRANSFUSE    Crossmatch Result Compatible    Unit Number [id number]    Blood Component Type RED CELLS,LR    Unit division 00    Status of Unit ALLOCATED    Transfusion Status OK TO TRANSFUSE    Crossmatch Result Compatible    Unit Number [id number]    Blood Component Type RED CELLS,LR     Unit division 00    Status of Unit ALLOCATED    Transfusion Status OK TO TRANSFUSE    Crossmatch Result Compatible   Prepare RBC     Status: None   Collection Time: 06/03/17  9:43 AM  Result Value Ref Range   Order Confirmation ORDER PROCESSED BY BLOOD BANK   Prepare Pheresed Platelets     Status: None   Collection Time: 06/03/17  9:43 AM  Result Value Ref Range   Unit Number [id number]    Blood Component Type PLTP LR1 PAS    Unit division 00    Status of Unit REL FROM [facility]    Transfusion Status OK TO TRANSFUSE   Glucose, capillary     Status: None   Collection Time: 06/03/17  3:53 PM  Result Value Ref Range   Glucose-Capillary 97 65 - 99 mg/dL  I-STAT 3, arterial blood gas (G3+)     Status: Abnormal   Collection Time: 06/03/17  3:55 PM  Result Value Ref Range   pH, Arterial 7.340 (L) 7.350 - 7.450   pCO2 arterial 35.1 32.0 - 48.0 mmHg   pO2, Arterial 69.0 (L) 83.0 - 108.0 mmHg   Bicarbonate 19.6 (L) 20.0 - 28.0 mmol/L   TCO2 21 (L) 22 - 32 mmol/L   O2 Saturation 95.0 %   Acid-base deficit 6.0 (H) 0.0 - 2.0 mmol/L   Patient temperature 34.1 C    Collection site RADIAL, ALLEN'S TEST ACCEPTABLE    Drawn by VP    Sample type ARTERIAL   Glucose, capillary     Status: Abnormal   Collection Time: 06/03/17  7:56 PM  Result Value Ref Range   Glucose-Capillary 138 (H) 65 - 99 mg/dL  Glucose, capillary     Status: Abnormal   Collection Time: 06/03/17 11:52 PM  Result Value Ref Range   Glucose-Capillary 147 (H) 65 - 99 mg/dL  Blood gas, arterial     Status: Abnormal   Collection Time: 06/04/17  3:27 AM  Result Value Ref Range   FIO2 80.00    Delivery systems VENTILATOR    Mode PRESSURE REGULATED VOLUME CONTROL    VT 470 mL   LHR  20 resp/min   Peep/cpap 12.0 cm H20   pH, Arterial 7.367 7.350 - 7.450   pCO2 arterial 40.6 32.0 - 48.0 mmHg   pO2, Arterial 126 (H) 83.0 - 108.0 mmHg   Bicarbonate 22.7 20.0 - 28.0 mmol/L   Acid-base deficit 1.8 0.0 - 2.0 mmol/L   O2  Saturation 98.5 %   Patient temperature 98.6    Collection site A-LINE    Drawn by [id number]    Sample type ARTERIAL DRAW   Glucose, capillary     Status: Abnormal   Collection Time: 06/04/17  4:09 AM  Result Value Ref Range   Glucose-Capillary 150 (H) 65 - 99 mg/dL  Glucose, capillary     Status: Abnormal   Collection Time: 06/04/17  8:09 AM  Result Value Ref Range   Glucose-Capillary 149 (H) 65 - 99 mg/dL     Assessment/Plan:   NEURO  Altered Mental Status:  sedation   Plan: Will decrease sedation once her hypoxemia has improved  PULM  Atelectasis/collapse (focal and right sided infiltrates) Pneumothorax (traumatic)   Plan: Improved with manipulation of chest tube and fixing the suction device  CARDIO  No arrhythmias   Plan: CPM  RENAL  Urine output and renal function are good.   Plan: No changes except probably decrease all IVFs  GI  No specific issues   Plan: CPM.  Continue tube feedings.  ID  No known infectious sources   Plan: CPM  HEME  Anemia acute blood loss anemia)   Plan: Mild anemia does not require correction.  ENDO Nno known issues   Plan: CPM  Global Issues  Patient improved with adjustment of right CT and correction of suction.  Down to FIO2 50%, will check ABG in a few hours.      LOS: 5 days   Additional comments:I reviewed the patients new imaging test results. CXR and I have discussed and reviewed with family members patient's husband and father I believe  Critical Care Total Time*: 2230 Minutes  Jimmye NormanJames Kensie Susman 06/04/2017  *Care during the described time interval was provided by me and/or other providers on the critical care team.  I have reviewed this patient's available data, including medical history, events of note, physical examination and test results as part of my evaluation.

## 2017-06-04 NOTE — Progress Notes (Signed)
Titrating down on neo and still have been unable to turn off.  Have been unable to attempt MRI at this time due to patient's stability and needing several IV gtts. Neo is still on at 20mcg @ this time with a MAP of 65.  Spoke with MRI and they are unable to take us at this time anyway.  We will continue to attempt to go tonight.

## 2017-06-04 NOTE — Progress Notes (Signed)
PEEP decreased to 10 per MD order

## 2017-06-04 NOTE — Progress Notes (Signed)
MRI not yet performed Final recommendations pending MRI results

## 2017-06-04 NOTE — Progress Notes (Addendum)
RT found FIO2 lowered to 50%. MD must have lowered when in with patient. FIO2 should be at 50% per MD order also.

## 2017-06-05 ENCOUNTER — Inpatient Hospital Stay (HOSPITAL_COMMUNITY): Payer: No Typology Code available for payment source

## 2017-06-05 ENCOUNTER — Encounter (HOSPITAL_COMMUNITY): Payer: Self-pay | Admitting: Student

## 2017-06-05 DIAGNOSIS — J9601 Acute respiratory failure with hypoxia: Secondary | ICD-10-CM

## 2017-06-05 LAB — BASIC METABOLIC PANEL
ANION GAP: 5 (ref 5–15)
Anion gap: 7 (ref 5–15)
BUN: 7 mg/dL (ref 6–20)
BUN: 5 mg/dL — AB (ref 6–20)
CALCIUM: 7.6 mg/dL — AB (ref 8.9–10.3)
CHLORIDE: 111 mmol/L (ref 101–111)
CO2: 25 mmol/L (ref 22–32)
CO2: 29 mmol/L (ref 22–32)
CREATININE: 0.32 mg/dL — AB (ref 0.44–1.00)
Calcium: 7.2 mg/dL — ABNORMAL LOW (ref 8.9–10.3)
Chloride: 105 mmol/L (ref 101–111)
Creatinine, Ser: 0.32 mg/dL — ABNORMAL LOW (ref 0.44–1.00)
GFR calc Af Amer: >60 mL/min (ref >60)
GFR calc non Af Amer: >60 mL/min (ref >60)
GFR calc non Af Amer: >60 mL/min (ref >60)
GLUCOSE: 103 mg/dL — AB (ref 65–99)
Glucose, Bld: 195 mg/dL — ABNORMAL HIGH (ref 65–99)
POTASSIUM: 3.1 mmol/L — AB (ref 3.5–5.1)
Potassium: 3.6 mmol/L (ref 3.5–5.1)
SODIUM: 141 mmol/L (ref 135–145)
Sodium: 141 mmol/L (ref 135–145)

## 2017-06-05 LAB — BLOOD GAS, ARTERIAL
ACID-BASE EXCESS: 1.7 mmol/L (ref 0.0–2.0)
BICARBONATE: 26.1 mmol/L (ref 20.0–28.0)
Drawn by: 418751
FIO2: 50
LHR: 20 {breaths}/min
MECHVT: 470 mL
O2 SAT: 86.9 %
PATIENT TEMPERATURE: 98.6
PEEP/CPAP: 10 cmH2O
pCO2 arterial: 44 mmHg (ref 32.0–48.0)
pH, Arterial: 7.392 (ref 7.350–7.450)
pO2, Arterial: 50.1 mmHg — ABNORMAL LOW (ref 83.0–108.0)

## 2017-06-05 LAB — POCT I-STAT 3, ART BLOOD GAS (G3+)
ACID-BASE EXCESS: 2 mmol/L (ref 0.0–2.0)
BICARBONATE: 28.4 mmol/L — AB (ref 20.0–28.0)
O2 Saturation: 90 %
PH ART: 7.337 — AB (ref 7.350–7.450)
Patient temperature: 36.3
TCO2: 30 mmol/L (ref 22–32)
pCO2 arterial: 52.6 mmHg — ABNORMAL HIGH (ref 32.0–48.0)
pO2, Arterial: 61 mmHg — ABNORMAL LOW (ref 83.0–108.0)

## 2017-06-05 LAB — CBC WITH DIFFERENTIAL/PLATELET
BASOS PCT: 0 %
Basophils Absolute: 0 10*3/uL (ref 0.0–0.1)
EOS PCT: 0 %
Eosinophils Absolute: 0 10*3/uL (ref 0.0–0.7)
HEMATOCRIT: 31.6 % — AB (ref 36.0–46.0)
HEMOGLOBIN: 10.5 g/dL — AB (ref 12.0–15.0)
Lymphocytes Relative: 9 %
Lymphs Abs: 1.2 10*3/uL (ref 0.7–4.0)
MCH: 28.7 pg (ref 26.0–34.0)
MCHC: 33.2 g/dL (ref 30.0–36.0)
MCV: 86.3 fL (ref 78.0–100.0)
MONOS PCT: 3 %
Monocytes Absolute: 0.4 10*3/uL (ref 0.1–1.0)
NEUTROS PCT: 88 %
Neutro Abs: 12 10*3/uL — ABNORMAL HIGH (ref 1.7–7.7)
Platelets: 172 10*3/uL (ref 150–400)
RBC: 3.66 MIL/uL — ABNORMAL LOW (ref 3.87–5.11)
RDW: 15.2 % (ref 11.5–15.5)
WBC Morphology: INCREASED
WBC: 13.6 10*3/uL — AB (ref 4.0–10.5)

## 2017-06-05 LAB — VANCOMYCIN, TROUGH: VANCOMYCIN TR: 6 ug/mL — AB (ref 15–20)

## 2017-06-05 LAB — GLUCOSE, CAPILLARY
GLUCOSE-CAPILLARY: 82 mg/dL (ref 65–99)
Glucose-Capillary: 69 mg/dL (ref 65–99)
Glucose-Capillary: 81 mg/dL (ref 65–99)
Glucose-Capillary: 81 mg/dL (ref 65–99)
Glucose-Capillary: 74 mg/dL (ref 65–99)

## 2017-06-05 LAB — TRIGLYCERIDES: Triglycerides: 148 mg/dL (ref <150)

## 2017-06-05 LAB — PROCALCITONIN: Procalcitonin: 1.33 ng/mL

## 2017-06-05 MED ORDER — PROPOFOL 10 MG/ML IV BOLUS
INTRAVENOUS | Status: AC
  Filled 2017-06-05: qty 20

## 2017-06-05 MED ORDER — ORAL CARE MOUTH RINSE
15.0000 mL | OROMUCOSAL | Status: DC
  Administered 2017-06-05 – 2017-06-13 (×83): 15 mL via OROMUCOSAL

## 2017-06-05 MED ORDER — VECURONIUM BOLUS VIA INFUSION
10.0000 mg | Freq: Once | INTRAVENOUS | Status: AC
  Administered 2017-06-05: 10 mg via INTRAVENOUS
  Filled 2017-06-05: qty 10

## 2017-06-05 MED ORDER — MIDAZOLAM HCL 2 MG/2ML IJ SOLN
INTRAMUSCULAR | Status: AC
  Filled 2017-06-05: qty 2

## 2017-06-05 MED ORDER — VANCOMYCIN HCL 10 G IV SOLR
1250.0000 mg | Freq: Three times a day (TID) | INTRAVENOUS | Status: DC
  Administered 2017-06-05 – 2017-06-07 (×5): 1250 mg via INTRAVENOUS
  Filled 2017-06-05 (×6): qty 1250

## 2017-06-05 MED ORDER — POTASSIUM CHLORIDE 10 MEQ/50ML IV SOLN
10.0000 meq | INTRAVENOUS | Status: AC
  Administered 2017-06-05 (×4): 10 meq via INTRAVENOUS
  Filled 2017-06-05 (×3): qty 50

## 2017-06-05 MED ORDER — VECURONIUM BROMIDE 10 MG IV SOLR
0.8000 ug/kg/min | INTRAVENOUS | Status: DC
  Administered 2017-06-05: 1 ug/kg/min via INTRAVENOUS
  Administered 2017-06-07: 1.1 ug/kg/min via INTRAVENOUS
  Administered 2017-06-08 – 2017-06-09 (×2): 1 ug/kg/min via INTRAVENOUS
  Filled 2017-06-05 (×6): qty 100

## 2017-06-05 MED ORDER — FENTANYL 2500MCG IN NS 250ML (10MCG/ML) PREMIX INFUSION: 100.0000 ug/h | INTRAVENOUS | Status: DC

## 2017-06-05 MED ORDER — FENTANYL CITRATE (PF) 100 MCG/2ML IJ SOLN: 100.0000 ug | Freq: Once | INTRAMUSCULAR | Status: DC | PRN

## 2017-06-05 MED ORDER — FUROSEMIDE 10 MG/ML IJ SOLN
40.0000 mg | Freq: Once | INTRAMUSCULAR | Status: AC
  Administered 2017-06-05: 40 mg via INTRAVENOUS
  Filled 2017-06-05: qty 4

## 2017-06-05 MED ORDER — PIPERACILLIN-TAZOBACTAM 3.375 G IVPB
3.3750 g | Freq: Three times a day (TID) | INTRAVENOUS | Status: DC
  Administered 2017-06-05 – 2017-06-10 (×16): 3.375 g via INTRAVENOUS
  Filled 2017-06-05 (×17): qty 50

## 2017-06-05 MED ORDER — ARTIFICIAL TEARS OPHTHALMIC OINT
1.0000 "application " | TOPICAL_OINTMENT | Freq: Three times a day (TID) | OPHTHALMIC | Status: DC
  Administered 2017-06-05 – 2017-06-09 (×12): 1 via OPHTHALMIC
  Filled 2017-06-05: qty 3.5

## 2017-06-05 MED ORDER — SODIUM CHLORIDE 0.9 % IV SOLN
0.0000 ug/min | INTRAVENOUS | Status: DC
  Administered 2017-06-06: 360 ug/min via INTRAVENOUS
  Administered 2017-06-06: 340 ug/min via INTRAVENOUS
  Administered 2017-06-06: 330 ug/min via INTRAVENOUS
  Administered 2017-06-06: 220 ug/min via INTRAVENOUS
  Administered 2017-06-06: 360 ug/min via INTRAVENOUS
  Administered 2017-06-07: 220 ug/min via INTRAVENOUS
  Administered 2017-06-07: 160 ug/min via INTRAVENOUS
  Administered 2017-06-07: 220 ug/min via INTRAVENOUS
  Administered 2017-06-09: 30 ug/min via INTRAVENOUS
  Administered 2017-06-10: 10 ug/min via INTRAVENOUS
  Administered 2017-06-11: 130 ug/min via INTRAVENOUS
  Administered 2017-06-11: 200 ug/min via INTRAVENOUS
  Administered 2017-06-12: 130 ug/min via INTRAVENOUS
  Administered 2017-06-12: 310 ug/min via INTRAVENOUS
  Administered 2017-06-13: 200 ug/min via INTRAVENOUS
  Administered 2017-06-13 (×2): 360 ug/min via INTRAVENOUS
  Administered 2017-06-13: 280 ug/min via INTRAVENOUS
  Administered 2017-06-14: 240 ug/min via INTRAVENOUS
  Administered 2017-06-14: 220 ug/min via INTRAVENOUS
  Administered 2017-06-15: 90 ug/min via INTRAVENOUS
  Administered 2017-06-15 – 2017-06-16 (×2): 140 ug/min via INTRAVENOUS
  Administered 2017-06-16: 150 ug/min via INTRAVENOUS
  Administered 2017-06-17: 200 ug/min via INTRAVENOUS
  Filled 2017-06-05 (×11): qty 8
  Filled 2017-06-05: qty 3
  Filled 2017-06-05 (×2): qty 8
  Filled 2017-06-05: qty 5
  Filled 2017-06-05 (×2): qty 8
  Filled 2017-06-05: qty 5
  Filled 2017-06-05 (×3): qty 8
  Filled 2017-06-05: qty 5
  Filled 2017-06-05 (×7): qty 8

## 2017-06-05 MED ORDER — PROPOFOL 1000 MG/100ML IV EMUL
25.0000 ug/kg/min | INTRAVENOUS | Status: DC
  Administered 2017-06-05 – 2017-06-07 (×3): 30 ug/kg/min via INTRAVENOUS
  Administered 2017-06-08: 25 ug/kg/min via INTRAVENOUS
  Administered 2017-06-09: 60 ug/kg/min via INTRAVENOUS
  Administered 2017-06-09: 80 ug/kg/min via INTRAVENOUS
  Administered 2017-06-09: 45 ug/kg/min via INTRAVENOUS
  Administered 2017-06-09: 80 ug/kg/min via INTRAVENOUS
  Administered 2017-06-09: 35 ug/kg/min via INTRAVENOUS
  Administered 2017-06-10: 70 ug/kg/min via INTRAVENOUS
  Filled 2017-06-05 (×16): qty 100

## 2017-06-05 MED ORDER — FENTANYL CITRATE (PF) 100 MCG/2ML IJ SOLN: 100.0000 ug | Freq: Once | INTRAMUSCULAR | Status: DC

## 2017-06-05 MED ORDER — FENTANYL CITRATE (PF) 250 MCG/5ML IJ SOLN
INTRAMUSCULAR | Status: AC
  Filled 2017-06-05: qty 5

## 2017-06-05 MED ORDER — FENTANYL BOLUS VIA INFUSION
50.0000 ug | INTRAVENOUS | Status: DC | PRN
  Filled 2017-06-05: qty 50

## 2017-06-05 MED ORDER — CHLORHEXIDINE GLUCONATE 0.12% ORAL RINSE (MEDLINE KIT)
15.0000 mL | Freq: Two times a day (BID) | OROMUCOSAL | Status: DC
  Administered 2017-06-05 – 2017-06-13 (×17): 15 mL via OROMUCOSAL

## 2017-06-05 NOTE — Procedures (Signed)
Follow up ABG done at this time, no changes made.  RT will monitor
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
## 2017-06-05 NOTE — Procedures (Signed)
Will hold off on ABG until pt is sedated & paralyzed.  Pt remains desynchronous with vent and RR >40.

## 2017-06-05 NOTE — Progress Notes (Signed)
Follow up - Trauma and Critical Care  Patient Details:    Briana [name] is an 22 y.o. female.  Lines/tubes : Airway 7 mm (Active)  Secured at (cm) 21 cm 06/05/2017  3:01 AM  Measured From Lips 06/05/2017  3:01 AM  Secured Location Center 06/05/2017  3:01 AM  Secured By [employer] 06/05/2017  3:01 AM  Tube Holder Repositioned Yes 06/05/2017  3:01 AM  Cuff Pressure (cm H2O) 28 cm H2O 06/04/2017  8:55 PM  Site Condition Dry 06/05/2017  3:01 AM     CVC Triple Lumen 05/31/17 Right Subclavian (Active)  Indication for Insertion or Continuance of Line Limited venous access - need for IV therapy >5 days (PICC only) 06/04/2017  8:00 PM  Site Assessment Clean;Dry;Intact 06/04/2017  8:00 PM  Proximal Lumen Status Infusing 06/04/2017  8:00 PM  Medial Lumen Status Infusing 06/04/2017  8:00 PM  Distal Lumen Status Infusing;In-line blood sampling system in place 06/04/2017  8:00 PM  Dressing Type Transparent 06/04/2017  8:00 PM  Dressing Status Clean;Dry;Intact 06/04/2017  8:00 PM  Dressing Intervention Other (Comment) 06/01/2017  8:00 PM  Dressing Change Due 06/08/17 06/04/2017  8:00 PM     Arterial Line 05/31/17 Right Brachial (Active)  Site Assessment Bleeding;Draining 06/04/2017  8:00 PM  Line Status Pulsatile blood flow 06/04/2017  8:00 PM  Art Line Waveform Appropriate 06/04/2017  8:00 PM  Art Line Interventions Zeroed and calibrated;Connections checked and tightened 06/04/2017  8:00 PM  Color/Movement/Sensation Capillary refill less than 3 sec 06/04/2017  8:00 PM  Dressing Type Transparent 06/04/2017  8:00 PM  Dressing Status Old drainage;Antimicrobial disc in place 06/04/2017  8:00 PM  Interventions Dressing changed 06/02/2017  3:00 AM  Dressing Change Due 06/09/17 06/04/2017  8:00 PM     Chest Tube 1 Right;Anterior;Lateral 14 Fr. (Active)  Suction -40 cm H2O 06/04/2017  8:00 PM  Chest Tube Air Leak None 06/04/2017  8:00 PM  Patency Intervention Milked;Tip/tilt 06/04/2017  9:20 AM  Drainage Description Serosanguineous  06/04/2017  8:00 PM  Dressing Status Clean;Dry;Intact 06/04/2017  8:00 PM  Dressing Intervention Dressing changed 06/04/2017  9:20 AM  Site Assessment Clean;Dry;Intact 06/04/2017  8:00 PM  Surrounding Skin Unable to view 06/04/2017  8:00 PM  Output (mL) 70 mL 06/05/2017  6:00 AM     Negative Pressure Wound Therapy Leg Anterior;Lower;Right (Active)  Site / Wound Assessment Dressing in place / Unable to assess 06/04/2017  8:00 PM  Cycle Continuous 06/04/2017  8:00 PM  Dressing Status Intact 06/04/2017  8:00 PM  Drainage Amount Moderate 06/04/2017  8:00 PM  Drainage Description Serosanguineous 06/04/2017  8:00 PM  Output (mL) 40 mL 06/05/2017  6:00 AM     NG/OG Tube Orogastric Center mouth (Active)  Site Assessment Clean;Dry;Intact 06/04/2017  8:00 PM  Ongoing Placement Verification No change in respiratory status;No acute changes, not attributed to clinical condition 06/04/2017  8:00 PM  Status Infusing tube feed 06/04/2017  8:00 PM  Drainage Appearance Bile 06/02/2017  8:00 AM  Output (mL) 140 mL 06/02/2017  6:00 PM     Urethral Catheter (Active)  Indication for Insertion or Continuance of Catheter Other (comment) 06/04/2017  8:00 PM  Site Assessment Clean;Intact 06/04/2017  8:00 PM  Catheter Maintenance Bag below level of bladder;Catheter secured;Drainage bag/tubing not touching floor;Seal intact;No dependent loops;Insertion date on drainage bag 06/04/2017  8:00 PM  Collection Container Standard drainage bag 06/04/2017  8:00 PM  Securement Method Securing device (Describe) 06/04/2017  8:00 PM  Urinary Catheter Interventions  Unclamped 06/04/2017  8:00 PM  Output (mL) 1700 mL 06/05/2017  6:00 AM    Microbiology/Sepsis markers: Results for orders placed or performed during the hospital encounter of 05/30/17  MRSA PCR Screening     Status: None   Collection Time: 05/30/17  1:58 PM  Result Value Ref Range Status   MRSA by PCR NEGATIVE NEGATIVE Final    Comment:        The GeneXpert MRSA Assay (FDA approved for NASAL  specimens only), is one component of a comprehensive MRSA colonization surveillance program. It is not intended to diagnose MRSA infection nor to guide or monitor treatment for MRSA infections.     Anti-infectives:  Anti-infectives (From admission, onward)   Start     Dose/Rate Route Frequency Ordered Stop   06/03/17 1600  vancomycin (VANCOCIN) IVPB 1000 mg/200 mL premix     1,000 mg 200 mL/hr over 60 Minutes Intravenous Every 12 hours 06/03/17 1417     06/03/17 1226  tobramycin (NEBCIN) powder  Status:  Discontinued       As needed 06/03/17 1227 06/03/17 1326   06/03/17 1225  vancomycin (VANCOCIN) powder  Status:  Discontinued       As needed 06/03/17 1226 06/03/17 1326   05/30/17 0930  ceFAZolin (ANCEF) IVPB 1 g/50 mL premix  Status:  Discontinued     1 g 100 mL/hr over 30 Minutes Intravenous Every 8 hours 05/30/17 0917 06/03/17 1417   05/30/17 0600  ceFAZolin (ANCEF) IVPB 1 g/50 mL premix     1 g 100 mL/hr over 30 Minutes Intravenous  Once 05/30/17 0556 05/30/17 0742      Best Practice/Protocols:  VTE Prophylaxis: Lovenox (prophylaxtic dose) and Mechanical GI Prophylaxis: Proton Pump Inhibitor Continous Sedation ARDS Starting ARDSnet protocol today.  Consults: Treatment Team:  Md, Trauma, MD Haddix, Gillie MannersKevin P, MD Ditty, Loura HaltBenjamin Jared, MD    Events:  Subjective:    Overnight Issues: Starting yesterday evening, the patient started having more respiratory issues, abecame more agitated after being well sedated on the ventilator the day before and yesterday morning.  This AM she was on FIO2 80% with PEEP 10, and oxygen saturations only 93% with PaO2 on ABG 50  Objective:  Vital signs for last 24 hours: Temp:  [98.4 F (36.9 C)-101.5 F (38.6 C)] 100.8 F (38.2 C) (01/04 0700) Pulse Rate:  [68-143] 142 (01/04 0700) Resp:  [17-34] 28 (01/04 0700) BP: (87-137)/(57-102) 130/82 (01/04 0700) SpO2:  [88 %-99 %] 94 % (01/04 0700) Arterial Line BP: (103-155)/(55-87)  142/77 (01/04 0700) FiO2 (%):  [50 %-80 %] 80 % (01/04 0700) Weight:  [65.7 kg (144 lb 13.5 oz)] 65.7 kg (144 lb 13.5 oz) (01/04 0400)  Hemodynamic parameters for last 24 hours: CVP:  [4 mmHg-19 mmHg] 12 mmHg  Intake/Output from previous day: 01/03 0701 - 01/04 0700 In: 4839.2 [I.V.:3841.9; NG/GT:597.3; IV Piggyback:400] Out: 3945 [Urine:3785; Drains:90; Chest Tube:70]  Intake/Output this shift: No intake/output data recorded.  Vent settings for last 24 hours: Vent Mode: PRVC FiO2 (%):  [50 %-80 %] 80 % Set Rate:  [20 bmp] 20 bmp Vt Set:  [470 mL] 470 mL PEEP:  [10 cmH20-12 cmH20] 10 cmH20 Plateau Pressure:  [16 cmH20-25 cmH20] 16 cmH20  Physical Exam:  General: Has been fighting the ventilator Neuro: RASS 0, RASS -1 and agitated Resp: rales bibasilar, RLL and RML and rhonchi posterior - right, RLL and RML CVS: sinus tachycardia GI: soft, nontender, BS WNL, no r/g and on tube feedings.  Extremities: edematous bilaterally  Results for orders placed or performed during the hospital encounter of 05/30/17 (from the past 24 hour(s))  Glucose, capillary     Status: Abnormal   Collection Time: 06/04/17  8:09 AM  Result Value Ref Range   Glucose-Capillary 149 (H) 65 - 99 mg/dL  CBC with Differential/Platelet     Status: Abnormal   Collection Time: 06/04/17  9:11 AM  Result Value Ref Range   WBC 20.2 (H) 4.0 - 10.5 K/uL   RBC 3.87 3.87 - 5.11 MIL/uL   Hemoglobin 11.4 (L) 12.0 - 15.0 g/dL   HCT 16.1 (L) 09.6 - 04.5 %   MCV 85.8 78.0 - 100.0 fL   MCH 29.5 26.0 - 34.0 pg   MCHC 34.3 30.0 - 36.0 g/dL   RDW 40.9 81.1 - 91.4 %   Platelets 146 (L) 150 - 400 K/uL   Neutrophils Relative % 96 %   Lymphocytes Relative 2 %   Monocytes Relative 2 %   Eosinophils Relative 0 %   Basophils Relative 0 %   Band Neutrophils 0 %   Metamyelocytes Relative 0 %   Myelocytes 0 %   Promyelocytes Absolute 0 %   Blasts 0 %   nRBC 0 0 /100 WBC   Other 0 %   Neutro Abs 19.4 (H) 1.7 - 7.7 K/uL    Lymphs Abs 0.4 (L) 0.7 - 4.0 K/uL   Monocytes Absolute 0.4 0.1 - 1.0 K/uL   Eosinophils Absolute 0.0 0.0 - 0.7 K/uL   Basophils Absolute 0.0 0.0 - 0.1 K/uL   RBC Morphology POLYCHROMASIA PRESENT    WBC Morphology INCREASED BANDS (>20% BANDS)   Basic metabolic panel     Status: Abnormal   Collection Time: 06/04/17  9:11 AM  Result Value Ref Range   Sodium 143 135 - 145 mmol/L   Potassium 3.6 3.5 - 5.1 mmol/L   Chloride 113 (H) 101 - 111 mmol/L   CO2 26 22 - 32 mmol/L   Glucose, Bld 162 (H) 65 - 99 mg/dL   BUN <5 (L) 6 - 20 mg/dL   Creatinine, Ser 7.82 (L) 0.44 - 1.00 mg/dL   Calcium 7.3 (L) 8.9 - 10.3 mg/dL   GFR calc non Af Amer >60 >60 mL/min   GFR calc Af Amer >60 >60 mL/min   Anion gap 4 (L) 5 - 15  Glucose, capillary     Status: Abnormal   Collection Time: 06/04/17 12:08 PM  Result Value Ref Range   Glucose-Capillary 168 (H) 65 - 99 mg/dL  Glucose, capillary     Status: Abnormal   Collection Time: 06/04/17  3:26 PM  Result Value Ref Range   Glucose-Capillary 140 (H) 65 - 99 mg/dL  I-STAT 3, arterial blood gas (G3+)     Status: Abnormal   Collection Time: 06/04/17  6:20 PM  Result Value Ref Range   pH, Arterial 7.376 7.350 - 7.450   pCO2 arterial 38.4 32.0 - 48.0 mmHg   pO2, Arterial 60.0 (L) 83.0 - 108.0 mmHg   Bicarbonate 22.5 20.0 - 28.0 mmol/L   TCO2 24 22 - 32 mmol/L   O2 Saturation 90.0 %   Acid-base deficit 2.0 0.0 - 2.0 mmol/L   Patient temperature 98.6 F    Collection site ARTERIAL LINE    Drawn by Operator    Sample type ARTERIAL   Glucose, capillary     Status: Abnormal   Collection Time: 06/04/17  7:53 PM  Result Value Ref  Range   Glucose-Capillary 115 (H) 65 - 99 mg/dL  Glucose, capillary     Status: Abnormal   Collection Time: 06/04/17 11:43 PM  Result Value Ref Range   Glucose-Capillary 105 (H) 65 - 99 mg/dL  Blood gas, arterial     Status: Abnormal   Collection Time: 06/05/17  2:30 AM  Result Value Ref Range   FIO2 50.00    Delivery systems  VENTILATOR    Mode PRESSURE REGULATED VOLUME CONTROL    VT 470 mL   LHR 20 resp/min   Peep/cpap 10.0 cm H20   pH, Arterial 7.392 7.350 - 7.450   pCO2 arterial 44.0 32.0 - 48.0 mmHg   pO2, Arterial 50.1 (L) 83.0 - 108.0 mmHg   Bicarbonate 26.1 20.0 - 28.0 mmol/L   Acid-Base Excess 1.7 0.0 - 2.0 mmol/L   O2 Saturation 86.9 %   Patient temperature 98.6    Collection site A-LINE    Drawn by [id number]    Sample type ARTERIAL DRAW    Allens test (pass/fail) PASS PASS  Glucose, capillary     Status: None   Collection Time: 06/05/17  3:58 AM  Result Value Ref Range   Glucose-Capillary 69 65 - 99 mg/dL  Triglycerides     Status: None   Collection Time: 06/05/17  4:16 AM  Result Value Ref Range   Triglycerides 148 <150 mg/dL  CBC with Differential/Platelet     Status: Abnormal   Collection Time: 06/05/17  4:16 AM  Result Value Ref Range   WBC 13.6 (H) 4.0 - 10.5 K/uL   RBC 3.66 (L) 3.87 - 5.11 MIL/uL   Hemoglobin 10.5 (L) 12.0 - 15.0 g/dL   HCT 04.5 (L) 40.9 - 81.1 %   MCV 86.3 78.0 - 100.0 fL   MCH 28.7 26.0 - 34.0 pg   MCHC 33.2 30.0 - 36.0 g/dL   RDW 91.4 78.2 - 95.6 %   Platelets 172 150 - 400 K/uL   Neutrophils Relative % 88 %   Lymphocytes Relative 9 %   Monocytes Relative 3 %   Eosinophils Relative 0 %   Basophils Relative 0 %   Neutro Abs 12.0 (H) 1.7 - 7.7 K/uL   Lymphs Abs 1.2 0.7 - 4.0 K/uL   Monocytes Absolute 0.4 0.1 - 1.0 K/uL   Eosinophils Absolute 0.0 0.0 - 0.7 K/uL   Basophils Absolute 0.0 0.0 - 0.1 K/uL   RBC Morphology POLYCHROMASIA PRESENT    WBC Morphology INCREASED BANDS (>20% BANDS)   Basic metabolic panel     Status: Abnormal   Collection Time: 06/05/17  4:16 AM  Result Value Ref Range   Sodium 141 135 - 145 mmol/L   Potassium 3.1 (L) 3.5 - 5.1 mmol/L   Chloride 111 101 - 111 mmol/L   CO2 25 22 - 32 mmol/L   Glucose, Bld 195 (H) 65 - 99 mg/dL   BUN 7 6 - 20 mg/dL   Creatinine, Ser 2.13 (L) 0.44 - 1.00 mg/dL   Calcium 7.2 (L) 8.9 - 10.3 mg/dL   GFR  calc non Af Amer >60 >60 mL/min   GFR calc Af Amer >60 >60 mL/min   Anion gap 5 5 - 15     Assessment/Plan:   NEURO  Altered Mental Status:  agitation and sedation   Plan: Will start to heavily sedate the patient and paralyze her chimically to facilitate ventilation.  PULM  Atelectasis/collapse (Bibasilar) ARDS (non-pulmonary etiology Likley secondary to multiple orthopedic injuries.  Possible  fat emboli) Lung Trauma (right and with contusion of lung) and Pneumothorax (traumatic)   Plan: CXR shows expanded lung on the right with CT in good position  CARDIO  Sinus Tachycardia   Plan: No specific treatment  RENAL  Hypokalemia moderate (2.8 - 3.5 meq/dl) Urine output and renal functioin are good.   Plan: Lasix,   GI  No specific issues   Plan: continue tube feedings for now.  ID  No known infectious problems   Plan: CPM.  No additional antibiotics  HEME  Anemia acute blood loss anemia)   Plan: No tranfusion necessary for now  ENDO No specific issues   Plan: CPM  Global Issues  ARDS is new problem.  Ventilator pressure are not that high right now, but patient has fluffy infiltrates developing bilaterally and dense bibasilar consolidation.  We may need to get assistance from CCM.  Secretions have not been a problems but we will send a sputum sample.ARDSnet protocol, paralyzation for now.  Increase PEEP    LOS: 6 days   Additional comments:I reviewed the patient's new clinical lab test results. cbc/bmet/abg and I reviewed the patients new imaging test results. cxr  Critical Care Total Time*: 34 minutes   Jimmye Norman 06/05/2017  *Care during the described time interval was provided by me and/or other providers on the critical care team.  I have reviewed this patient's available data, including medical history, events of note, physical examination and test results as part of my evaluation.
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
## 2017-06-05 NOTE — Procedures (Signed)
Vent settings changed at this time per ARDS protocol per MD order, will follow up with ABG.  RT will monitor

## 2017-06-06 ENCOUNTER — Inpatient Hospital Stay (HOSPITAL_COMMUNITY): Payer: No Typology Code available for payment source

## 2017-06-06 LAB — BASIC METABOLIC PANEL
Anion gap: 10 (ref 5–15)
Anion gap: 6 (ref 5–15)
BUN: 5 mg/dL — AB (ref 6–20)
BUN: <5 mg/dL — ABNORMAL LOW (ref 6–20)
CHLORIDE: 103 mmol/L (ref 101–111)
CHLORIDE: 104 mmol/L (ref 101–111)
CO2: 30 mmol/L (ref 22–32)
CO2: 26 mmol/L (ref 22–32)
CREATININE: 0.32 mg/dL — AB (ref 0.44–1.00)
Calcium: 7.4 mg/dL — ABNORMAL LOW (ref 8.9–10.3)
Calcium: 7.6 mg/dL — ABNORMAL LOW (ref 8.9–10.3)
Creatinine, Ser: <0.3 mg/dL — ABNORMAL LOW (ref 0.44–1.00)
GFR calc Af Amer: >60 mL/min (ref >60)
GFR calc non Af Amer: >60 mL/min (ref >60)
GLUCOSE: 149 mg/dL — AB (ref 65–99)
GLUCOSE: 107 mg/dL — AB (ref 65–99)
POTASSIUM: 3.1 mmol/L — AB (ref 3.5–5.1)
POTASSIUM: 3.6 mmol/L (ref 3.5–5.1)
Sodium: 143 mmol/L (ref 135–145)
Sodium: 136 mmol/L (ref 135–145)

## 2017-06-06 LAB — POCT I-STAT 3, ART BLOOD GAS (G3+)
Acid-Base Excess: 3 mmol/L — ABNORMAL HIGH (ref 0.0–2.0)
BICARBONATE: 29.3 mmol/L — AB (ref 20.0–28.0)
O2 Saturation: 96 %
PH ART: 7.329 — AB (ref 7.350–7.450)
PO2 ART: 90 mmHg (ref 83.0–108.0)
Patient temperature: 99.6
TCO2: 31 mmol/L (ref 22–32)
pCO2 arterial: 56.1 mmHg — ABNORMAL HIGH (ref 32.0–48.0)

## 2017-06-06 LAB — GLUCOSE, CAPILLARY
GLUCOSE-CAPILLARY: 116 mg/dL — AB (ref 65–99)
GLUCOSE-CAPILLARY: 120 mg/dL — AB (ref 65–99)
GLUCOSE-CAPILLARY: 83 mg/dL (ref 65–99)
GLUCOSE-CAPILLARY: 93 mg/dL (ref 65–99)
Glucose-Capillary: 94 mg/dL (ref 65–99)
Glucose-Capillary: 121 mg/dL — ABNORMAL HIGH (ref 65–99)
Glucose-Capillary: 86 mg/dL (ref 65–99)
Glucose-Capillary: 57 mg/dL — ABNORMAL LOW (ref 65–99)

## 2017-06-06 LAB — BLOOD GAS, ARTERIAL
Acid-Base Excess: 3 mmol/L — ABNORMAL HIGH (ref 0.0–2.0)
Bicarbonate: 28.7 mmol/L — ABNORMAL HIGH (ref 20.0–28.0)
DRAWN BY: 36496
FIO2: 70
MECHVT: 350 mL
O2 SAT: 95.3 %
PATIENT TEMPERATURE: 98.6
PCO2 ART: 59.4 mmHg — AB (ref 32.0–48.0)
PEEP: 12 cmH2O
PO2 ART: 75.9 mmHg — AB (ref 83.0–108.0)
RATE: 30 resp/min
pH, Arterial: 7.306 — ABNORMAL LOW (ref 7.350–7.450)

## 2017-06-06 LAB — POCT I-STAT 7, (LYTES, BLD GAS, ICA,H+H)
ACID-BASE DEFICIT: 8 mmol/L — AB (ref 0.0–2.0)
Acid-base deficit: 7 mmol/L — ABNORMAL HIGH (ref 0.0–2.0)
Acid-base deficit: 5 mmol/L — ABNORMAL HIGH (ref 0.0–2.0)
Bicarbonate: 19.8 mmol/L — ABNORMAL LOW (ref 20.0–28.0)
Bicarbonate: 17.6 mmol/L — ABNORMAL LOW (ref 20.0–28.0)
Bicarbonate: 19.4 mmol/L — ABNORMAL LOW (ref 20.0–28.0)
Calcium, Ion: 1.12 mmol/L — ABNORMAL LOW (ref 1.15–1.40)
Calcium, Ion: 1.05 mmol/L — ABNORMAL LOW (ref 1.15–1.40)
Calcium, Ion: 1.09 mmol/L — ABNORMAL LOW (ref 1.15–1.40)
HCT: 31 % — ABNORMAL LOW (ref 36.0–46.0)
HCT: 28 % — ABNORMAL LOW (ref 36.0–46.0)
HCT: 30 % — ABNORMAL LOW (ref 36.0–46.0)
HEMOGLOBIN: 10.2 g/dL — AB (ref 12.0–15.0)
Hemoglobin: 10.5 g/dL — ABNORMAL LOW (ref 12.0–15.0)
Hemoglobin: 9.5 g/dL — ABNORMAL LOW (ref 12.0–15.0)
O2 SAT: 73 %
O2 SAT: 82 %
O2 Saturation: 77 %
PCO2 ART: 38.4 mmHg (ref 32.0–48.0)
PCO2 ART: 32.1 mmHg (ref 32.0–48.0)
PCO2 ART: 28.5 mmHg — AB (ref 32.0–48.0)
PH ART: 7.306 — AB (ref 7.350–7.450)
PH ART: 7.427 (ref 7.350–7.450)
PO2 ART: 37 mmHg — AB (ref 83.0–108.0)
POTASSIUM: 3.4 mmol/L — AB (ref 3.5–5.1)
POTASSIUM: 2.9 mmol/L — AB (ref 3.5–5.1)
Patient temperature: 34.2
Patient temperature: 34
Potassium: 3.2 mmol/L — ABNORMAL LOW (ref 3.5–5.1)
SODIUM: 145 mmol/L (ref 135–145)
Sodium: 142 mmol/L (ref 135–145)
Sodium: 143 mmol/L (ref 135–145)
TCO2: 21 mmol/L — AB (ref 22–32)
TCO2: 19 mmol/L — ABNORMAL LOW (ref 22–32)
TCO2: 20 mmol/L — AB (ref 22–32)
pH, Arterial: 7.333 — ABNORMAL LOW (ref 7.350–7.450)
pO2, Arterial: 39 mmHg — CL (ref 83.0–108.0)
pO2, Arterial: 35 mmHg — CL (ref 83.0–108.0)

## 2017-06-06 LAB — LACTIC ACID, PLASMA
Lactic Acid, Venous: 1.2 mmol/L (ref 0.5–1.9)
Lactic Acid, Venous: 1.3 mmol/L (ref 0.5–1.9)

## 2017-06-06 LAB — CBC WITH DIFFERENTIAL/PLATELET
BASOS PCT: 0 %
Basophils Absolute: 0 10*3/uL (ref 0.0–0.1)
EOS PCT: 1 %
Eosinophils Absolute: 0.3 10*3/uL (ref 0.0–0.7)
HCT: 35.2 % — ABNORMAL LOW (ref 36.0–46.0)
Hemoglobin: 10.9 g/dL — ABNORMAL LOW (ref 12.0–15.0)
LYMPHS ABS: 1.5 10*3/uL (ref 0.7–4.0)
Lymphocytes Relative: 5 %
MCH: 27.7 pg (ref 26.0–34.0)
MCHC: 31 g/dL (ref 30.0–36.0)
MCV: 89.3 fL (ref 78.0–100.0)
MONO ABS: 0.3 10*3/uL (ref 0.1–1.0)
Monocytes Relative: 1 %
NEUTROS ABS: 26.9 10*3/uL — AB (ref 1.7–7.7)
NEUTROS PCT: 93 %
PLATELETS: 240 10*3/uL (ref 150–400)
RBC: 3.94 MIL/uL (ref 3.87–5.11)
RDW: 15.6 % — ABNORMAL HIGH (ref 11.5–15.5)
WBC MORPHOLOGY: INCREASED
WBC: 29 10*3/uL — ABNORMAL HIGH (ref 4.0–10.5)

## 2017-06-06 LAB — POCT I-STAT 4, (NA,K, GLUC, HGB,HCT)
GLUCOSE: 94 mg/dL (ref 65–99)
HCT: 44 % (ref 36.0–46.0)
HEMOGLOBIN: 15 g/dL (ref 12.0–15.0)
POTASSIUM: 2.9 mmol/L — AB (ref 3.5–5.1)
Sodium: 142 mmol/L (ref 135–145)

## 2017-06-06 LAB — PROCALCITONIN: Procalcitonin: 1.09 ng/mL

## 2017-06-06 MED ORDER — POTASSIUM CHLORIDE 10 MEQ/50ML IV SOLN
10.0000 meq | INTRAVENOUS | Status: AC
  Administered 2017-06-06 (×6): 10 meq via INTRAVENOUS
  Filled 2017-06-06 (×6): qty 50

## 2017-06-06 MED ORDER — DEXTROSE 50 % IV SOLN
12.5000 g | Freq: Once | INTRAVENOUS | Status: AC
  Administered 2017-06-06: 12.5 g via INTRAVENOUS

## 2017-06-06 MED ORDER — PIVOT 1.5 CAL PO LIQD
1000.0000 mL | ORAL | Status: DC
  Administered 2017-06-06 – 2017-06-08 (×3): 1000 mL
  Filled 2017-06-06 (×4): qty 1000

## 2017-06-06 MED ORDER — DEXTROSE 50 % IV SOLN
INTRAVENOUS | Status: AC
  Filled 2017-06-06: qty 50

## 2017-06-06 MED ORDER — POTASSIUM CHLORIDE 20 MEQ PO PACK
20.0000 meq | PACK | Freq: Three times a day (TID) | ORAL | Status: AC
  Administered 2017-06-06 – 2017-06-07 (×3): 20 meq via ORAL
  Filled 2017-06-06 (×3): qty 1
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
## 2017-06-06 NOTE — Progress Notes (Addendum)
Patient ID: Briana [name], female   DOB: 11/01/1995, 22 y.o.   MRN: [id number] BP 120/89   Pulse 89   Temp 99.3 F (37.4 C)   Resp (!) 30   Ht 5\' 5"  (1.651 m)   Wt 70.3 kg (154 lb 15.7 oz)   LMP  (LMP Unknown) Comment: Neg Preg Test 05/30/17  SpO2 98%   BMI 25.79 kg/m  Sedated, intubated Perrl, +cough No new recommendations Also paralyzed chemically

## 2017-06-06 NOTE — Progress Notes (Signed)
Follow up - Trauma and Critical Care  Patient Details:    Briana [name] is an 22 y.o. female.  Lines/tubes : Airway 7 mm (Active)  Secured at (cm) 21 cm 06/05/2017  3:01 AM  Measured From Lips 06/05/2017  3:01 AM  Secured Location Center 06/05/2017  3:01 AM  Secured By [employer] 06/05/2017  3:01 AM  Tube Holder Repositioned Yes 06/05/2017  3:01 AM  Cuff Pressure (cm H2O) 28 cm H2O 06/04/2017  8:55 PM  Site Condition Dry 06/05/2017  3:01 AM     CVC Triple Lumen 05/31/17 Right Subclavian (Active)  Indication for Insertion or Continuance of Line Limited venous access - need for IV therapy >5 days (PICC only) 06/04/2017  8:00 PM  Site Assessment Clean;Dry;Intact 06/04/2017  8:00 PM  Proximal Lumen Status Infusing 06/04/2017  8:00 PM  Medial Lumen Status Infusing 06/04/2017  8:00 PM  Distal Lumen Status Infusing;In-line blood sampling system in place 06/04/2017  8:00 PM  Dressing Type Transparent 06/04/2017  8:00 PM  Dressing Status Clean;Dry;Intact 06/04/2017  8:00 PM  Dressing Intervention Other (Comment) 06/01/2017  8:00 PM  Dressing Change Due 06/08/17 06/04/2017  8:00 PM     Arterial Line 05/31/17 Right Brachial (Active)  Site Assessment Bleeding;Draining 06/04/2017  8:00 PM  Line Status Pulsatile blood flow 06/04/2017  8:00 PM  Art Line Waveform Appropriate 06/04/2017  8:00 PM  Art Line Interventions Zeroed and calibrated;Connections checked and tightened 06/04/2017  8:00 PM  Color/Movement/Sensation Capillary refill less than 3 sec 06/04/2017  8:00 PM  Dressing Type Transparent 06/04/2017  8:00 PM  Dressing Status Old drainage;Antimicrobial disc in place 06/04/2017  8:00 PM  Interventions Dressing changed 06/02/2017  3:00 AM  Dressing Change Due 06/09/17 06/04/2017  8:00 PM     Chest Tube 1 Right;Anterior;Lateral 14 Fr. (Active)  Suction -40 cm H2O 06/04/2017  8:00 PM  Chest Tube Air Leak None 06/04/2017  8:00 PM  Patency Intervention Milked;Tip/tilt 06/04/2017  9:20 AM  Drainage Description Serosanguineous  06/04/2017  8:00 PM  Dressing Status Clean;Dry;Intact 06/04/2017  8:00 PM  Dressing Intervention Dressing changed 06/04/2017  9:20 AM  Site Assessment Clean;Dry;Intact 06/04/2017  8:00 PM  Surrounding Skin Unable to view 06/04/2017  8:00 PM  Output (mL) 70 mL 06/05/2017  6:00 AM     Negative Pressure Wound Therapy Leg Anterior;Lower;Right (Active)  Site / Wound Assessment Dressing in place / Unable to assess 06/04/2017  8:00 PM  Cycle Continuous 06/04/2017  8:00 PM  Dressing Status Intact 06/04/2017  8:00 PM  Drainage Amount Moderate 06/04/2017  8:00 PM  Drainage Description Serosanguineous 06/04/2017  8:00 PM  Output (mL) 40 mL 06/05/2017  6:00 AM     NG/OG Tube Orogastric Center mouth (Active)  Site Assessment Clean;Dry;Intact 06/04/2017  8:00 PM  Ongoing Placement Verification No change in respiratory status;No acute changes, not attributed to clinical condition 06/04/2017  8:00 PM  Status Infusing tube feed 06/04/2017  8:00 PM  Drainage Appearance Bile 06/02/2017  8:00 AM  Output (mL) 140 mL 06/02/2017  6:00 PM     Urethral Catheter (Active)  Indication for Insertion or Continuance of Catheter Other (comment) 06/04/2017  8:00 PM  Site Assessment Clean;Intact 06/04/2017  8:00 PM  Catheter Maintenance Bag below level of bladder;Catheter secured;Drainage bag/tubing not touching floor;Seal intact;No dependent loops;Insertion date on drainage bag 06/04/2017  8:00 PM  Collection Container Standard drainage bag 06/04/2017  8:00 PM  Securement Method Securing device (Describe) 06/04/2017  8:00 PM  Urinary Catheter Interventions  Unclamped 06/04/2017  8:00 PM  Output (mL) 1700 mL 06/05/2017  6:00 AM    Microbiology/Sepsis markers: Results for orders placed or performed during the hospital encounter of 05/30/17  MRSA PCR Screening     Status: None   Collection Time: 05/30/17  1:58 PM  Result Value Ref Range Status   MRSA by PCR NEGATIVE NEGATIVE Final    Comment:        The GeneXpert MRSA Assay (FDA approved for NASAL  specimens only), is one component of a comprehensive MRSA colonization surveillance program. It is not intended to diagnose MRSA infection nor to guide or monitor treatment for MRSA infections.   Culture, respiratory (NON-Expectorated)     Status: None (Preliminary result)   Collection Time: 06/05/17  8:19 AM  Result Value Ref Range Status   Specimen Description TRACHEAL ASPIRATE  Final   Special Requests NONE  Final   Gram Stain   Final    ABUNDANT WBC PRESENT, PREDOMINANTLY PMN MODERATE GRAM POSITIVE COCCI IN PAIRS FEW GRAM NEGATIVE RODS FEW GRAM POSITIVE RODS FEW GRAM NEGATIVE DIPLOCOCCI    Culture PENDING  Incomplete   Report Status PENDING  Incomplete    Anti-infectives:  Anti-infectives (From admission, onward)   Start     Dose/Rate Route Frequency Ordered Stop   06/05/17 1700  vancomycin (VANCOCIN) 1,250 mg in sodium chloride 0.9 % 250 mL IVPB     1,250 mg 166.7 mL/hr over 90 Minutes Intravenous Every 8 hours 06/05/17 1625     06/05/17 0830  piperacillin-tazobactam (ZOSYN) IVPB 3.375 g     3.375 g 12.5 mL/hr over 240 Minutes Intravenous Every 8 hours 06/05/17 0803     06/03/17 1600  vancomycin (VANCOCIN) IVPB 1000 mg/200 mL premix  Status:  Discontinued     1,000 mg 200 mL/hr over 60 Minutes Intravenous Every 12 hours 06/03/17 1417 06/05/17 1625   06/03/17 1226  tobramycin (NEBCIN) powder  Status:  Discontinued       As needed 06/03/17 1227 06/03/17 1326   06/03/17 1225  vancomycin (VANCOCIN) powder  Status:  Discontinued       As needed 06/03/17 1226 06/03/17 1326   05/30/17 0930  ceFAZolin (ANCEF) IVPB 1 g/50 mL premix  Status:  Discontinued     1 g 100 mL/hr over 30 Minutes Intravenous Every 8 hours 05/30/17 0917 06/03/17 1417   05/30/17 0600  ceFAZolin (ANCEF) IVPB 1 g/50 mL premix     1 g 100 mL/hr over 30 Minutes Intravenous  Once 05/30/17 0556 05/30/17 0742      Best Practice/Protocols:  VTE Prophylaxis: Lovenox (prophylaxtic dose) and Mechanical GI  Prophylaxis: Proton Pump Inhibitor Continous Sedation ARDSnet protocol/ paralyzed   Consults: Treatment Team:  Md, Trauma, MD Haddix, Gillie MannersKevin P, MD Ditty, Loura HaltBenjamin Jared, MD  CCM   Events:  Subjective:    Overnight Issues: No acute changes, continues on PEEP 10 FiO2 70, continuous paralysis  Objective:  Vital signs for last 24 hours: Temp:  [97.3 F (36.3 C)-100.4 F (38 C)] 99.1 F (37.3 C) (01/05 0915) Pulse Rate:  [91-123] 91 (01/05 0915) Resp:  [28-31] 30 (01/05 0915) BP: (75-137)/(46-101) 124/90 (01/05 0915) SpO2:  [90 %-99 %] 98 % (01/05 0915) Arterial Line BP: (94-150)/(46-82) 143/75 (01/05 0915) FiO2 (%):  [70 %-80 %] 70 % (01/05 0749)  Hemodynamic parameters for last 24 hours: CVP:  [4 mmHg] 4 mmHg  Intake/Output from previous day: 01/04 0701 - 01/05 0700 In: 5961.3 [I.V.:4595.3; NG/GT:516; IV Piggyback:850] Out: 6200 [  WUJWJ:1914; Drains:25; Chest Tube:100]  Intake/Output this shift: Total I/O In: 492.6 [I.V.:412.6; NG/GT:80] Out: 750 [Urine:750]  Vent settings for last 24 hours: Vent Mode: PRVC FiO2 (%):  [70 %-80 %] 70 % Set Rate:  [30 bmp] 30 bmp Vt Set:  [350 mL] 350 mL PEEP:  [10 cmH20-14 cmH20] 10 cmH20 Plateau Pressure:  [21 cmH20-26 cmH20] 21 cmH20  Physical Exam:  General: sedated/paralyzed Neuro: as above Resp: course bilaterally CVS: RRR GI: soft, nontender, BS WNL, no r/g and on tube feedings. Extremities: edematous bilaterally  Results for orders placed or performed during the hospital encounter of 05/30/17 (from the past 24 hour(s))  I-STAT 3, arterial blood gas (G3+)     Status: Abnormal   Collection Time: 06/05/17 10:59 AM  Result Value Ref Range   pH, Arterial 7.337 (L) 7.350 - 7.450   pCO2 arterial 52.6 (H) 32.0 - 48.0 mmHg   pO2, Arterial 61.0 (L) 83.0 - 108.0 mmHg   Bicarbonate 28.4 (H) 20.0 - 28.0 mmol/L   TCO2 30 22 - 32 mmol/L   O2 Saturation 90.0 %   Acid-Base Excess 2.0 0.0 - 2.0 mmol/L   Patient temperature 36.3  C    Sample type ARTERIAL   Glucose, capillary     Status: None   Collection Time: 06/05/17 11:33 AM  Result Value Ref Range   Glucose-Capillary 82 65 - 99 mg/dL  Vancomycin, trough     Status: Abnormal   Collection Time: 06/05/17  2:28 PM  Result Value Ref Range   Vancomycin Tr 6 (L) 15 - 20 ug/mL  Procalcitonin - Baseline     Status: None   Collection Time: 06/05/17  2:28 PM  Result Value Ref Range   Procalcitonin 1.33 ng/mL  Basic metabolic panel     Status: Abnormal   Collection Time: 06/05/17  2:28 PM  Result Value Ref Range   Sodium 141 135 - 145 mmol/L   Potassium 3.6 3.5 - 5.1 mmol/L   Chloride 105 101 - 111 mmol/L   CO2 29 22 - 32 mmol/L   Glucose, Bld 103 (H) 65 - 99 mg/dL   BUN 5 (L) 6 - 20 mg/dL   Creatinine, Ser 7.82 (L) 0.44 - 1.00 mg/dL   Calcium 7.6 (L) 8.9 - 10.3 mg/dL   GFR calc non Af Amer >60 >60 mL/min   GFR calc Af Amer >60 >60 mL/min   Anion gap 7 5 - 15  Glucose, capillary     Status: None   Collection Time: 06/05/17  3:53 PM  Result Value Ref Range   Glucose-Capillary 81 65 - 99 mg/dL  Glucose, capillary     Status: None   Collection Time: 06/05/17  7:36 PM  Result Value Ref Range   Glucose-Capillary 74 65 - 99 mg/dL  Glucose, capillary     Status: None   Collection Time: 06/06/17 12:10 AM  Result Value Ref Range   Glucose-Capillary 83 65 - 99 mg/dL  Blood gas, arterial     Status: Abnormal   Collection Time: 06/06/17  3:53 AM  Result Value Ref Range   FIO2 70.00    Delivery systems VENTILATOR    Mode PRESSURE REGULATED VOLUME CONTROL    VT 350 mL   LHR 30 resp/min   Peep/cpap 12.0 cm H20   pH, Arterial 7.306 (L) 7.350 - 7.450   pCO2 arterial 59.4 (H) 32.0 - 48.0 mmHg   pO2, Arterial 75.9 (L) 83.0 - 108.0 mmHg   Bicarbonate 28.7 (H)  20.0 - 28.0 mmol/L   Acid-Base Excess 3.0 (H) 0.0 - 2.0 mmol/L   O2 Saturation 95.3 %   Patient temperature 98.6    Collection site A-LINE    Drawn by [phone]    Sample type ARTERIAL DRAW   Glucose,  capillary     Status: None   Collection Time: 06/06/17  4:22 AM  Result Value Ref Range   Glucose-Capillary 94 65 - 99 mg/dL  CBC with Differential/Platelet     Status: Abnormal   Collection Time: 06/06/17  5:57 AM  Result Value Ref Range   WBC 29.0 (H) 4.0 - 10.5 K/uL   RBC 3.94 3.87 - 5.11 MIL/uL   Hemoglobin 10.9 (L) 12.0 - 15.0 g/dL   HCT 60.4 (L) 54.0 - 98.1 %   MCV 89.3 78.0 - 100.0 fL   MCH 27.7 26.0 - 34.0 pg   MCHC 31.0 30.0 - 36.0 g/dL   RDW 19.1 (H) 47.8 - 29.5 %   Platelets 240 150 - 400 K/uL   Neutrophils Relative % 93 %   Lymphocytes Relative 5 %   Monocytes Relative 1 %   Eosinophils Relative 1 %   Basophils Relative 0 %   Neutro Abs 26.9 (H) 1.7 - 7.7 K/uL   Lymphs Abs 1.5 0.7 - 4.0 K/uL   Monocytes Absolute 0.3 0.1 - 1.0 K/uL   Eosinophils Absolute 0.3 0.0 - 0.7 K/uL   Basophils Absolute 0.0 0.0 - 0.1 K/uL   WBC Morphology INCREASED BANDS (>20% BANDS)   Basic metabolic panel     Status: Abnormal   Collection Time: 06/06/17  5:57 AM  Result Value Ref Range   Sodium 143 135 - 145 mmol/L   Potassium 3.1 (L) 3.5 - 5.1 mmol/L   Chloride 103 101 - 111 mmol/L   CO2 30 22 - 32 mmol/L   Glucose, Bld 149 (H) 65 - 99 mg/dL   BUN 5 (L) 6 - 20 mg/dL   Creatinine, Ser 6.21 (L) 0.44 - 1.00 mg/dL   Calcium 7.4 (L) 8.9 - 10.3 mg/dL   GFR calc non Af Amer >60 >60 mL/min   GFR calc Af Amer >60 >60 mL/min   Anion gap 10 5 - 15  Procalcitonin     Status: None   Collection Time: 06/06/17  5:57 AM  Result Value Ref Range   Procalcitonin 1.09 ng/mL  Glucose, capillary     Status: Abnormal   Collection Time: 06/06/17  8:01 AM  Result Value Ref Range   Glucose-Capillary 121 (H) 65 - 99 mg/dL     Assessment/Plan:   NEURO  Altered Mental Status:  agitation and sedation   Plan: sedated and paralyzed for ventilation  PULM  Atelectasis/collapse (Bibasilar) ARDS (non-pulmonary etiology Likley secondary to multiple orthopedic injuries.  Possible fat emboli) Lung Trauma  (right and with contusion of lung) and Pneumothorax (traumatic)   Plan: CXR shows expanded lung on the right with CT in good position, appreciate pccm assistance with ards  CARDIO  Sinus Tachycardia   Plan: resolved pesently  RENAL  Hypokalemia moderate (2.8 - 3.5 meq/dl) Urine output and renal functioin are good.   Plan: replace potassium  GI  No specific issues   Plan: go down to trophic feeds only while pressors at high rate  ID  No known infectious problems   Plan: WBC up at 29, likely pulmonary etiology, continue [email or web address].zosyn  HEME  Anemia acute blood loss anemia)   Plan: No tranfusion necessary for now  ENDO No specific issues   Plan: CPM  Global Issues  ARDS - appreciate PCCM assistance    LOS: 7 days   Additional comments:I reviewed the patient's new clinical lab test results. cbc/bmet/abg and I reviewed the patients new imaging test results. cxr  Critical Care Total Time*: 30 minutes   Rigby Swamy A Britini Garcilazo 06/06/2017  *Care during the described time interval was provided by me and/or other providers on the critical care team.  I have reviewed this patient's available data, including medical history, events of note, physical examination and test results as part of my evaluation.
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
## 2017-06-07 ENCOUNTER — Inpatient Hospital Stay (HOSPITAL_COMMUNITY): Payer: No Typology Code available for payment source

## 2017-06-07 LAB — TYPE AND SCREEN
ABO/RH(D): O POS
ANTIBODY SCREEN: NEGATIVE
UNIT DIVISION: 0
UNIT DIVISION: 0
Unit division: 0
Unit division: 0

## 2017-06-07 LAB — BPAM RBC
BLOOD PRODUCT EXPIRATION DATE: 201901312359
BLOOD PRODUCT EXPIRATION DATE: 201901312359
BLOOD PRODUCT EXPIRATION DATE: 201901312359
BLOOD PRODUCT EXPIRATION DATE: 201901312359
ISSUE DATE / TIME: 201901021028
ISSUE DATE / TIME: 201901021028
ISSUE DATE / TIME: 201901021028
ISSUE DATE / TIME: 201901051253
UNIT TYPE AND RH: 5100
Unit Type and Rh: 5100
Unit Type and Rh: 5100
Unit Type and Rh: 5100

## 2017-06-07 LAB — BASIC METABOLIC PANEL
ANION GAP: 8 (ref 5–15)
BUN: 5 mg/dL — ABNORMAL LOW (ref 6–20)
CALCIUM: 8.3 mg/dL — AB (ref 8.9–10.3)
CO2: 26 mmol/L (ref 22–32)
CREATININE: 0.35 mg/dL — AB (ref 0.44–1.00)
Chloride: 102 mmol/L (ref 101–111)
GLUCOSE: 164 mg/dL — AB (ref 65–99)
Potassium: 3.7 mmol/L (ref 3.5–5.1)
Sodium: 136 mmol/L (ref 135–145)

## 2017-06-07 LAB — POCT I-STAT 3, ART BLOOD GAS (G3+)
ACID-BASE EXCESS: 3 mmol/L — AB (ref 0.0–2.0)
Bicarbonate: 28 mmol/L (ref 20.0–28.0)
O2 SAT: 97 %
PO2 ART: 89 mmHg (ref 83.0–108.0)
TCO2: 29 mmol/L (ref 22–32)
pCO2 arterial: 42.9 mmHg (ref 32.0–48.0)
pH, Arterial: 7.423 (ref 7.350–7.450)

## 2017-06-07 LAB — BLOOD GAS, ARTERIAL
ACID-BASE EXCESS: 3.1 mmol/L — AB (ref 0.0–2.0)
BICARBONATE: 27.7 mmol/L (ref 20.0–28.0)
Drawn by: 5155
FIO2: 60
LHR: 30 {breaths}/min
MODE: 350
O2 Saturation: 98.7 %
PEEP/CPAP: 10 cmH2O
PH ART: 7.395 (ref 7.350–7.450)
Patient temperature: 98.6
pCO2 arterial: 46.1 mmHg (ref 32.0–48.0)
pO2, Arterial: 125 mmHg — ABNORMAL HIGH (ref 83.0–108.0)

## 2017-06-07 LAB — CBC WITH DIFFERENTIAL/PLATELET
BASOS ABS: 0 10*3/uL (ref 0.0–0.1)
Basophils Relative: 0 %
Eosinophils Absolute: 0.3 10*3/uL (ref 0.0–0.7)
Eosinophils Relative: 1 %
HCT: 35.3 % — ABNORMAL LOW (ref 36.0–46.0)
HEMOGLOBIN: 11.4 g/dL — AB (ref 12.0–15.0)
LYMPHS PCT: 6 %
Lymphs Abs: 1.6 10*3/uL (ref 0.7–4.0)
MCH: 28.5 pg (ref 26.0–34.0)
MCHC: 32.3 g/dL (ref 30.0–36.0)
MCV: 88.3 fL (ref 78.0–100.0)
MONO ABS: 0.3 10*3/uL (ref 0.1–1.0)
Monocytes Relative: 1 %
NEUTROS ABS: 24.1 10*3/uL — AB (ref 1.7–7.7)
NEUTROS PCT: 92 %
PLATELETS: 301 10*3/uL (ref 150–400)
RBC: 4 MIL/uL (ref 3.87–5.11)
RDW: 15.1 % (ref 11.5–15.5)
WBC Morphology: INCREASED
WBC: 26.3 10*3/uL — ABNORMAL HIGH (ref 4.0–10.5)

## 2017-06-07 LAB — VANCOMYCIN, TROUGH: VANCOMYCIN TR: 19 ug/mL (ref 15–20)

## 2017-06-07 LAB — GLUCOSE, CAPILLARY
GLUCOSE-CAPILLARY: 102 mg/dL — AB (ref 65–99)
GLUCOSE-CAPILLARY: 109 mg/dL — AB (ref 65–99)
GLUCOSE-CAPILLARY: 116 mg/dL — AB (ref 65–99)
GLUCOSE-CAPILLARY: 69 mg/dL (ref 65–99)
GLUCOSE-CAPILLARY: 75 mg/dL (ref 65–99)
GLUCOSE-CAPILLARY: 86 mg/dL (ref 65–99)
GLUCOSE-CAPILLARY: 95 mg/dL (ref 65–99)

## 2017-06-07 LAB — MAGNESIUM: MAGNESIUM: 1.6 mg/dL — AB (ref 1.7–2.4)

## 2017-06-07 LAB — PROCALCITONIN: PROCALCITONIN: 1.06 ng/mL

## 2017-06-07 LAB — PHOSPHORUS: Phosphorus: 1.8 mg/dL — ABNORMAL LOW (ref 2.5–4.6)

## 2017-06-07 MED ORDER — FUROSEMIDE 10 MG/ML IJ SOLN
10.0000 mg | Freq: Three times a day (TID) | INTRAMUSCULAR | Status: DC
  Administered 2017-06-07 – 2017-06-08 (×4): 10 mg via INTRAVENOUS
  Filled 2017-06-07 (×4): qty 2

## 2017-06-07 MED ORDER — PROPOFOL 10 MG/ML IV BOLUS
INTRAVENOUS | Status: AC
  Filled 2017-06-07: qty 20

## 2017-06-07 MED ORDER — VANCOMYCIN HCL 10 G IV SOLR
1250.0000 mg | Freq: Three times a day (TID) | INTRAVENOUS | Status: DC
  Administered 2017-06-07 – 2017-06-12 (×16): 1250 mg via INTRAVENOUS
  Filled 2017-06-07 (×17): qty 1250

## 2017-06-07 MED ORDER — FENTANYL CITRATE (PF) 250 MCG/5ML IJ SOLN
INTRAMUSCULAR | Status: AC
  Filled 2017-06-07: qty 5

## 2017-06-07 MED ORDER — DEXTROSE 50 % IV SOLN
12.5000 g | Freq: Once | INTRAVENOUS | Status: AC
  Administered 2017-06-07: 12.5 g via INTRAVENOUS

## 2017-06-07 NOTE — Progress Notes (Signed)
Hypoglycemic Event   CBG: 57  Treatment: 12.5g D5 IV  Symptoms: None  Follow-up CBG: Time: 0000 CBG Result: 93  Possible Reasons for Event: Tube feed rate decreased earlier today  Comments/MD notified: N/A    Van Clineshelsea D Jerime Arif
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
## 2017-06-07 NOTE — Progress Notes (Signed)
Patient ID: Briana [name], female   DOB: 10/28/1995, 22 y.o.   MRN: [id number] BP 137/87   Pulse (!) 125   Temp (!) 100.6 F (38.1 C)   Resp (!) 30   Ht 5\' 5"  (1.651 m)   Wt 63.6 kg (140 lb 3.4 oz)   LMP  (LMP Unknown) Comment: Neg Preg Test 05/30/17  SpO2 99%   BMI 23.33 kg/m  Paralyzed, sedated,intubated Perrl No new recommendations, will go back tomorrow for washout of the lower extremity
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
## 2017-06-07 NOTE — Progress Notes (Signed)
Hypoglycemic Event  CBG: 69  Treatment: 12.5g D50 IV  Symptoms: None  Follow-up CBG: Time: 0443 CBG Result: 86  Possible Reasons for Event: Decreased tube feedings  Comments/MD notified: N/A    Briana [name] D Yousof Alderman

## 2017-06-07 NOTE — Anesthesia Preprocedure Evaluation (Addendum)
Anesthesia Evaluation  Patient identified by MRN, date of birth, ID band Patient unresponsive    Reviewed: Allergy & Precautions, NPO status , Patient's Chart, lab work & pertinent test results  Airway Mallampati: Intubated       Dental  (+) Teeth Intact   Pulmonary  Pulmonary contusions, PE, ?aspiration    + decreased breath sounds      Cardiovascular negative cardio ROS   Rhythm:Regular Rate:Tachycardia     Neuro/Psych negative neurological ROS  negative psych ROS   GI/Hepatic negative GI ROS, Neg liver ROS,   Endo/Other  negative endocrine ROS  Renal/GU negative Renal ROS  negative genitourinary   Musculoskeletal   Abdominal Normal abdominal exam  (+)   Peds  Hematology  (+) anemia ,   Anesthesia Other Findings Respiratory failure  Reproductive/Obstetrics                                                             Anesthesia Evaluation  Patient identified by MRN, date of birth, ID band Patient unresponsive    Reviewed: Allergy & Precautions, NPO status , Patient's Chart, lab work & pertinent test results  Airway Mallampati: Intubated       Dental  (+) Teeth Intact   Pulmonary neg pulmonary ROS,     + decreased breath sounds      Cardiovascular negative cardio ROS   Rhythm:Regular Rate:Tachycardia     Neuro/Psych negative neurological ROS  negative psych ROS   GI/Hepatic negative GI ROS, Neg liver ROS,   Endo/Other  negative endocrine ROS  Renal/GU negative Renal ROS  negative genitourinary   Musculoskeletal   Abdominal Normal abdominal exam  (+)   Peds  Hematology negative hematology ROS (+)   Anesthesia Other Findings   Reproductive/Obstetrics                           Anesthesia Physical Anesthesia Plan  ASA: II  Anesthesia Plan: General   Post-op Pain Management:    Induction: Intravenous  PONV Risk  Score and Plan: 4 or greater and Ondansetron, Dexamethasone, Midazolam and Scopolamine patch - Pre-op  Airway Management Planned: Oral ETT  Additional Equipment: None  Intra-op Plan:   Post-operative Plan: Extubation in OR  Informed Consent: I have reviewed the patients History and Physical, chart, labs and discussed the procedure including the risks, benefits and alternatives for the proposed anesthesia with the patient or authorized representative who has indicated his/her understanding and acceptance.   Dental advisory given  Plan Discussed with: CRNA  Anesthesia Plan Comments:         Anesthesia Quick Evaluation  Lab Results  Component Value Date   WBC 26.3 (H) 06/07/2017   HGB 11.4 (L) 06/07/2017   HCT 35.3 (L) 06/07/2017   MCV 88.3 06/07/2017   PLT 301 06/07/2017   Lab Results  Component Value Date   CREATININE 0.35 (L) 06/07/2017   BUN 5 (L) 06/07/2017   NA 136 06/07/2017   K 3.7 06/07/2017   CL 102 06/07/2017   CO2 26 06/07/2017    Anesthesia Physical  Anesthesia Plan  ASA: IV  Anesthesia Plan: General   Post-op Pain Management:    Induction: Intravenous and Inhalational  PONV Risk Score and Plan: 3 and Treatment  may vary due to age or medical condition  Airway Management Planned: Oral ETT  Additional Equipment: None  Intra-op Plan:   Post-operative Plan:   Informed Consent: I have reviewed the patients History and Physical, chart, labs and discussed the procedure including the risks, benefits and alternatives for the proposed anesthesia with the patient or authorized representative who has indicated his/her understanding and acceptance.   Dental advisory given  Plan Discussed with: CRNA  Anesthesia Plan Comments:        Anesthesia Quick Evaluation
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
## 2017-06-07 NOTE — Interval H&P Note (Signed)
History and Physical Interval Note:  06/07/2017 7:09 AM  Briana [name]  has presented today for surgery, with the diagnosis of Infected open tibia  The various methods of treatment have been discussed with the patient and family. After consideration of risks, benefits and other options for treatment, the patient has consented to  Procedure(s): IRRIGATION AND DEBRIDEMENT EXTREMITY (Right) as a surgical intervention .  The patient's history has been reviewed, patient examined, no change in status, stable for surgery.  I have reviewed the patient's chart and labs.  Questions were answered to the patient's satisfaction.     Haddix, Gillie MannersKevin P

## 2017-06-08 ENCOUNTER — Inpatient Hospital Stay (HOSPITAL_COMMUNITY): Payer: No Typology Code available for payment source | Admitting: Certified Registered"

## 2017-06-08 ENCOUNTER — Encounter (HOSPITAL_COMMUNITY): Admission: EM | Disposition: A | Discharge status: Rehabilitation Facility | Payer: Self-pay | Source: Home / Self Care

## 2017-06-08 ENCOUNTER — Inpatient Hospital Stay (HOSPITAL_COMMUNITY): Payer: No Typology Code available for payment source

## 2017-06-08 DIAGNOSIS — M79661 Pain in right lower leg: Secondary | ICD-10-CM | POA: Diagnosis not present

## 2017-06-08 DIAGNOSIS — S82251C Displaced comminuted fracture of shaft of right tibia, initial encounter for open fracture type IIIA, IIIB, or IIIC: Secondary | ICD-10-CM | POA: Diagnosis not present

## 2017-06-08 HISTORY — PX: I & D EXTREMITY: SHX5045

## 2017-06-08 LAB — CBC WITH DIFFERENTIAL/PLATELET
Basophils Absolute: 0 10*3/uL (ref 0.0–0.1)
Basophils Relative: 0 %
EOS PCT: 1 %
Eosinophils Absolute: 0.2 10*3/uL (ref 0.0–0.7)
HCT: 32.8 % — ABNORMAL LOW (ref 36.0–46.0)
Hemoglobin: 10.5 g/dL — ABNORMAL LOW (ref 12.0–15.0)
LYMPHS ABS: 0.9 10*3/uL (ref 0.7–4.0)
Lymphocytes Relative: 5 %
MCH: 28.4 pg (ref 26.0–34.0)
MCHC: 32 g/dL (ref 30.0–36.0)
MCV: 88.6 fL (ref 78.0–100.0)
MONO ABS: 0.5 10*3/uL (ref 0.1–1.0)
Monocytes Relative: 3 %
Neutro Abs: 15.6 10*3/uL — ABNORMAL HIGH (ref 1.7–7.7)
Neutrophils Relative %: 91 %
PLATELETS: 302 10*3/uL (ref 150–400)
RBC: 3.7 MIL/uL — AB (ref 3.87–5.11)
RDW: 15 % (ref 11.5–15.5)
WBC: 17.2 10*3/uL — AB (ref 4.0–10.5)

## 2017-06-08 LAB — BLOOD GAS, ARTERIAL
Acid-Base Excess: 5.4 mmol/L — ABNORMAL HIGH (ref 0.0–2.0)
Bicarbonate: 30.1 mmol/L — ABNORMAL HIGH (ref 20.0–28.0)
Drawn by: 51155
FIO2: 60
MECHVT: 350 mL
O2 Saturation: 98.3 %
PEEP: 10 cmH2O
Patient temperature: 98.6
RATE: 30 {breaths}/min
pCO2 arterial: 50.5 mmHg — ABNORMAL HIGH (ref 32.0–48.0)
pH, Arterial: 7.392 (ref 7.350–7.450)
pO2, Arterial: 117 mmHg — ABNORMAL HIGH (ref 83.0–108.0)

## 2017-06-08 LAB — BASIC METABOLIC PANEL WITH GFR
Anion gap: 10 (ref 5–15)
BUN: 8 mg/dL (ref 6–20)
CO2: 30 mmol/L (ref 22–32)
Calcium: 8.2 mg/dL — ABNORMAL LOW (ref 8.9–10.3)
Chloride: 96 mmol/L — ABNORMAL LOW (ref 101–111)
Creatinine, Ser: 0.31 mg/dL — ABNORMAL LOW (ref 0.44–1.00)
GFR calc Af Amer: >60 mL/min
GFR calc non Af Amer: >60 mL/min
Glucose, Bld: 116 mg/dL — ABNORMAL HIGH (ref 65–99)
Potassium: 3.6 mmol/L (ref 3.5–5.1)
Sodium: 136 mmol/L (ref 135–145)

## 2017-06-08 LAB — GLUCOSE, CAPILLARY
GLUCOSE-CAPILLARY: 92 mg/dL (ref 65–99)
Glucose-Capillary: 105 mg/dL — ABNORMAL HIGH (ref 65–99)
Glucose-Capillary: 104 mg/dL — ABNORMAL HIGH (ref 65–99)
Glucose-Capillary: 99 mg/dL (ref 65–99)
Glucose-Capillary: 135 mg/dL — ABNORMAL HIGH (ref 65–99)

## 2017-06-08 LAB — MAGNESIUM: Magnesium: 1.8 mg/dL (ref 1.7–2.4)

## 2017-06-08 LAB — PROTIME-INR
INR: 1.06
Prothrombin Time: 13.8 s (ref 11.4–15.2)

## 2017-06-08 LAB — LACTIC ACID, PLASMA: LACTIC ACID, VENOUS: 1 mmol/L (ref 0.5–1.9)

## 2017-06-08 LAB — TRIGLYCERIDES: Triglycerides: 185 mg/dL — ABNORMAL HIGH

## 2017-06-08 SURGERY — IRRIGATION AND DEBRIDEMENT EXTREMITY
Anesthesia: General | Laterality: Right

## 2017-06-08 MED ORDER — TOBRAMYCIN SULFATE 1.2 G IJ SOLR
INTRAMUSCULAR | Status: AC
  Filled 2017-06-08: qty 1.2

## 2017-06-08 MED ORDER — FUROSEMIDE 10 MG/ML IJ SOLN
10.0000 mg | Freq: Two times a day (BID) | INTRAMUSCULAR | Status: DC
  Administered 2017-06-08 – 2017-06-09 (×2): 10 mg via INTRAVENOUS
  Filled 2017-06-08 (×2): qty 2

## 2017-06-08 MED ORDER — VANCOMYCIN HCL 1000 MG IV SOLR
INTRAVENOUS | Status: AC
  Filled 2017-06-08: qty 1000

## 2017-06-08 MED ORDER — LACTATED RINGERS IV SOLN
INTRAVENOUS | Status: DC | PRN
  Administered 2017-06-08: 12:00:00 via INTRAVENOUS

## 2017-06-08 MED ORDER — TOBRAMYCIN POWD
Status: DC | PRN
  Administered 2017-06-08 (×2): 1200 mg

## 2017-06-08 MED ORDER — ALBUMIN HUMAN 25 % IV SOLN: 25.0000 g | Freq: Once | INTRAVENOUS | Status: AC

## 2017-06-08 MED ORDER — PROPOFOL 10 MG/ML IV BOLUS
INTRAVENOUS | Status: AC
  Filled 2017-06-08: qty 20

## 2017-06-08 MED ORDER — 0.9 % SODIUM CHLORIDE (POUR BTL) OPTIME
TOPICAL | Status: DC | PRN
  Administered 2017-06-08: 1000 mL

## 2017-06-08 MED ORDER — ALBUMIN HUMAN 25 % IV SOLN
25.0000 g | Freq: Four times a day (QID) | INTRAVENOUS | Status: DC
  Administered 2017-06-08: 25 g via INTRAVENOUS
  Administered 2017-06-08: 12.5 g via INTRAVENOUS
  Administered 2017-06-08: 25 g via INTRAVENOUS
  Administered 2017-06-08 – 2017-06-09 (×5): 12.5 g via INTRAVENOUS
  Filled 2017-06-08 (×4): qty 100
  Filled 2017-06-08 (×2): qty 50

## 2017-06-08 MED ORDER — VANCOMYCIN HCL 1000 MG IV SOLR
INTRAVENOUS | Status: DC | PRN
  Administered 2017-06-08 (×2): 1000 mg

## 2017-06-08 MED ORDER — CHLORHEXIDINE GLUCONATE 0.12 % MT SOLN
OROMUCOSAL | Status: AC
  Filled 2017-06-08: qty 15

## 2017-06-08 MED ORDER — ROCURONIUM BROMIDE 10 MG/ML (PF) SYRINGE
PREFILLED_SYRINGE | INTRAVENOUS | Status: DC | PRN
  Administered 2017-06-08 (×2): 50 mg via INTRAVENOUS

## 2017-06-08 SURGICAL SUPPLY — 58 items
BANDAGE ACE 4X5 VEL STRL LF (GAUZE/BANDAGES/DRESSINGS) ×3 IMPLANT
BANDAGE ACE 6X5 VEL STRL LF (GAUZE/BANDAGES/DRESSINGS) ×3 IMPLANT
BNDG COHESIVE 4X5 TAN STRL (GAUZE/BANDAGES/DRESSINGS) ×3 IMPLANT
BNDG GAUZE ELAST 4 BULKY (GAUZE/BANDAGES/DRESSINGS) ×6 IMPLANT
BONE CEMENT PALACOSE (Cement) ×3 IMPLANT
BOWL SMART MIX CTS (DISPOSABLE) ×3 IMPLANT
BRUSH SCRUB SURG 4.25 DISP (MISCELLANEOUS) ×6 IMPLANT
CANISTER WOUNDNEG PRESSURE 500 (CANNISTER) ×3 IMPLANT
CEMENT BONE PALACOSE (Cement) ×1 IMPLANT
CHLORAPREP W/TINT 26ML (MISCELLANEOUS) ×3 IMPLANT
COVER MAYO STAND STRL (DRAPES) ×3 IMPLANT
COVER SURGICAL LIGHT HANDLE (MISCELLANEOUS) ×6 IMPLANT
DRAPE ORTHO SPLIT 77X108 STRL (DRAPES) ×4
DRAPE SURG 17X23 STRL (DRAPES) ×3 IMPLANT
DRAPE SURG ORHT 6 SPLT 77X108 (DRAPES) ×2 IMPLANT
DRAPE U-SHAPE 47X51 STRL (DRAPES) ×3 IMPLANT
DRSG ADAPTIC 3X8 NADH LF (GAUZE/BANDAGES/DRESSINGS) ×6 IMPLANT
DRSG PAD ABDOMINAL 8X10 ST (GAUZE/BANDAGES/DRESSINGS) ×3 IMPLANT
DRSG VAC ATS MED SENSATRAC (GAUZE/BANDAGES/DRESSINGS) ×3 IMPLANT
ELECT CAUTERY BLADE 6.4 (BLADE) ×3 IMPLANT
ELECT REM PT RETURN 9FT ADLT (ELECTROSURGICAL)
ELECTRODE REM PT RTRN 9FT ADLT (ELECTROSURGICAL) IMPLANT
EVACUATOR 1/8 PVC DRAIN (DRAIN) IMPLANT
GAUZE SPONGE 4X4 12PLY STRL (GAUZE/BANDAGES/DRESSINGS) ×3 IMPLANT
GLOVE BIO SURGEON STRL SZ7.5 (GLOVE) ×12 IMPLANT
GLOVE BIOGEL PI IND STRL 7.0 (GLOVE) ×2 IMPLANT
GLOVE BIOGEL PI IND STRL 7.5 (GLOVE) ×1 IMPLANT
GLOVE BIOGEL PI INDICATOR 7.0 (GLOVE) ×4
GLOVE BIOGEL PI INDICATOR 7.5 (GLOVE) ×2
GLOVE ECLIPSE 7.0 STRL STRAW (GLOVE) ×3 IMPLANT
GOWN STRL REUS W/ TWL LRG LVL3 (GOWN DISPOSABLE) ×2 IMPLANT
GOWN STRL REUS W/TWL LRG LVL3 (GOWN DISPOSABLE) ×4
HANDPIECE INTERPULSE COAX TIP (DISPOSABLE)
KIT BASIN OR (CUSTOM PROCEDURE TRAY) ×3 IMPLANT
KIT ROOM TURNOVER OR (KITS) ×3 IMPLANT
MANIFOLD NEPTUNE II (INSTRUMENTS) ×3 IMPLANT
NS IRRIG 1000ML POUR BTL (IV SOLUTION) ×3 IMPLANT
PACK ORTHO EXTREMITY (CUSTOM PROCEDURE TRAY) ×3 IMPLANT
PAD ARMBOARD 7.5X6 YLW CONV (MISCELLANEOUS) ×6 IMPLANT
PAD CAST 4YDX4 CTTN HI CHSV (CAST SUPPLIES) ×1 IMPLANT
PADDING CAST COTTON 4X4 STRL (CAST SUPPLIES) ×2
PADDING CAST COTTON 6X4 STRL (CAST SUPPLIES) ×3 IMPLANT
SET HNDPC FAN SPRY TIP SCT (DISPOSABLE) IMPLANT
SPLINT PLASTER CAST XFAST 5X30 (CAST SUPPLIES) ×1 IMPLANT
SPLINT PLASTER XFAST SET 5X30 (CAST SUPPLIES) ×2
SPONGE LAP 18X18 X RAY DECT (DISPOSABLE) ×3 IMPLANT
SUT ETHILON 2 0 FS 18 (SUTURE) ×6 IMPLANT
SUT ETHILON 3 0 PS 1 (SUTURE) ×12 IMPLANT
SUT MON AB 2-0 CT1 36 (SUTURE) ×3 IMPLANT
SUT PDS AB 0 CT 36 (SUTURE) IMPLANT
SWAB CULTURE ESWAB REG 1ML (MISCELLANEOUS) IMPLANT
TOWEL OR 17X24 6PK STRL BLUE (TOWEL DISPOSABLE) ×3 IMPLANT
TOWEL OR 17X26 10 PK STRL BLUE (TOWEL DISPOSABLE) ×6 IMPLANT
TUBE CONNECTING 12'X1/4 (SUCTIONS) ×1
TUBE CONNECTING 12X1/4 (SUCTIONS) ×2 IMPLANT
UNDERPAD 30X30 (UNDERPADS AND DIAPERS) ×3 IMPLANT
WATER STERILE IRR 1000ML POUR (IV SOLUTION) ×3 IMPLANT
YANKAUER SUCT BULB TIP NO VENT (SUCTIONS) ×3 IMPLANT
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
## 2017-06-08 NOTE — Transfer of Care (Signed)
Immediate Anesthesia Transfer of Care Note  Patient: Briana [name]  Procedure(s) Performed: IRRIGATION AND DEBRIDEMENT EXTREMITY (Right )  Patient Location: NICU  Anesthesia Type:General  Level of Consciousness: Patient remains intubated per anesthesia plan  Airway & Oxygen Therapy: Patient remains intubated per anesthesia plan and Patient placed on Ventilator (see vital sign flow sheet for setting)  Post-op Assessment: Report given to RN and Post -op Vital signs reviewed and stable  Post vital signs: Reviewed and stable  Last Vitals:  Vitals:   06/08/17 1159 06/08/17 1200  BP:  119/79  Pulse: (!) 135 (!) 133  Resp: (!) 22 (!) 30  Temp:  37.8 C  SpO2: 98% 97%    Last Pain:  Vitals:   06/07/17 1614  TempSrc: Axillary  PainSc:          Complications: No apparent anesthesia complications

## 2017-06-08 NOTE — Progress Notes (Signed)
Cervical MRI still not performed because of medical instability. Await results of MRI. Keep cervical collar.
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
## 2017-06-08 NOTE — Progress Notes (Signed)
Transported PT to O/R
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
## 2017-06-08 NOTE — Anesthesia Postprocedure Evaluation (Signed)
Anesthesia Post Note  Patient: Briana [name]  Procedure(s) Performed: IRRIGATION AND DEBRIDEMENT EXTREMITY (Right )     Patient location during evaluation: PACU Anesthesia Type: General Level of consciousness: awake and alert Pain management: pain level controlled Vital Signs Assessment: post-procedure vital signs reviewed and stable Respiratory status: spontaneous breathing, nonlabored ventilation, respiratory function stable and patient connected to nasal cannula oxygen Cardiovascular status: blood pressure returned to baseline and stable Postop Assessment: no apparent nausea or vomiting Anesthetic complications: no    Last Vitals:  Vitals:   06/08/17 1159 06/08/17 1200  BP:  119/79  Pulse: (!) 135 (!) 133  Resp: (!) 22 (!) 30  Temp:  37.8 C  SpO2: 98% 97%    Last Pain:  Vitals:   06/07/17 1614  TempSrc: Axillary  PainSc:                  Shelton SilvasKevin D Jamerson Vonbargen

## 2017-06-08 NOTE — Progress Notes (Signed)
Transported PT to unit from O/R
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
## 2017-06-08 NOTE — Anesthesia Procedure Notes (Signed)
Date/Time: 06/08/2017 12:32 PM Performed by: Izola Priceockfield, Yessika Otte Walton Jr., CRNA Comments: Pt remains intubated from ICU.

## 2017-06-09 ENCOUNTER — Inpatient Hospital Stay (HOSPITAL_COMMUNITY): Payer: No Typology Code available for payment source

## 2017-06-09 DIAGNOSIS — R0902 Hypoxemia: Secondary | ICD-10-CM

## 2017-06-09 DIAGNOSIS — J939 Pneumothorax, unspecified: Secondary | ICD-10-CM

## 2017-06-09 LAB — BLOOD GAS, ARTERIAL
ACID-BASE EXCESS: 7.2 mmol/L — AB (ref 0.0–2.0)
Bicarbonate: 31.7 mmol/L — ABNORMAL HIGH (ref 20.0–28.0)
DRAWN BY: 51702
FIO2: 50
LHR: 30 {breaths}/min
MECHVT: 350 mL
O2 Saturation: 98.9 %
PATIENT TEMPERATURE: 100.2
PCO2 ART: 51.5 mmHg — AB (ref 32.0–48.0)
PEEP/CPAP: 10 cmH2O
PO2 ART: 138 mmHg — AB (ref 83.0–108.0)
pH, Arterial: 7.41 (ref 7.350–7.450)

## 2017-06-09 LAB — BASIC METABOLIC PANEL
Anion gap: 9 (ref 5–15)
BUN: 8 mg/dL (ref 6–20)
CALCIUM: 8.6 mg/dL — AB (ref 8.9–10.3)
CO2: 31 mmol/L (ref 22–32)
Chloride: 94 mmol/L — ABNORMAL LOW (ref 101–111)
Creatinine, Ser: <0.3 mg/dL — ABNORMAL LOW (ref 0.44–1.00)
GLUCOSE: 139 mg/dL — AB (ref 65–99)
POTASSIUM: 3.3 mmol/L — AB (ref 3.5–5.1)
SODIUM: 134 mmol/L — AB (ref 135–145)

## 2017-06-09 LAB — GLUCOSE, CAPILLARY
GLUCOSE-CAPILLARY: 106 mg/dL — AB (ref 65–99)
GLUCOSE-CAPILLARY: 127 mg/dL — AB (ref 65–99)
GLUCOSE-CAPILLARY: 95 mg/dL (ref 65–99)
Glucose-Capillary: 121 mg/dL — ABNORMAL HIGH (ref 65–99)
Glucose-Capillary: 116 mg/dL — ABNORMAL HIGH (ref 65–99)
Glucose-Capillary: 105 mg/dL — ABNORMAL HIGH (ref 65–99)

## 2017-06-09 LAB — POCT I-STAT 3, ART BLOOD GAS (G3+)
ACID-BASE EXCESS: 7 mmol/L — AB (ref 0.0–2.0)
Acid-Base Excess: 10 mmol/L — ABNORMAL HIGH (ref 0.0–2.0)
BICARBONATE: 31.4 mmol/L — AB (ref 20.0–28.0)
Bicarbonate: 34 mmol/L — ABNORMAL HIGH (ref 20.0–28.0)
O2 SAT: 99 %
O2 Saturation: 99 %
PCO2 ART: 48.6 mmHg — AB (ref 32.0–48.0)
PH ART: 7.479 — AB (ref 7.350–7.450)
PO2 ART: 130 mmHg — AB (ref 83.0–108.0)
Patient temperature: 102
TCO2: 35 mmol/L — ABNORMAL HIGH (ref 22–32)
TCO2: 33 mmol/L — ABNORMAL HIGH (ref 22–32)
pCO2 arterial: 42.5 mmHg (ref 32.0–48.0)
pH, Arterial: 7.459 — ABNORMAL HIGH (ref 7.350–7.450)
pO2, Arterial: 150 mmHg — ABNORMAL HIGH (ref 83.0–108.0)

## 2017-06-09 LAB — CBC
HEMATOCRIT: 30 % — AB (ref 36.0–46.0)
HEMOGLOBIN: 9.6 g/dL — AB (ref 12.0–15.0)
MCH: 28.5 pg (ref 26.0–34.0)
MCHC: 32 g/dL (ref 30.0–36.0)
MCV: 89 fL (ref 78.0–100.0)
Platelets: 283 10*3/uL (ref 150–400)
RBC: 3.37 MIL/uL — ABNORMAL LOW (ref 3.87–5.11)
RDW: 14.8 % (ref 11.5–15.5)
WBC: 10 10*3/uL (ref 4.0–10.5)

## 2017-06-09 MED ORDER — SODIUM CHLORIDE 0.9 % IV SOLN
0.5000 mg/h | INTRAVENOUS | Status: DC
  Administered 2017-06-10: 4 mg/h via INTRAVENOUS
  Administered 2017-06-10 (×2): 6 mg/h via INTRAVENOUS
  Administered 2017-06-11: 4 mg/h via INTRAVENOUS
  Administered 2017-06-11: 3 mg/h via INTRAVENOUS
  Administered 2017-06-12: 5 mg/h via INTRAVENOUS
  Administered 2017-06-12: 7 mg/h via INTRAVENOUS
  Administered 2017-06-12: 8 mg/h via INTRAVENOUS
  Administered 2017-06-13 (×2): 7 mg/h via INTRAVENOUS
  Administered 2017-06-14: 4 mg/h via INTRAVENOUS
  Administered 2017-06-14: 8 mg/h via INTRAVENOUS
  Administered 2017-06-14: 6 mg/h via INTRAVENOUS
  Administered 2017-06-14: 5 mg/h via INTRAVENOUS
  Administered 2017-06-15: 2 mg/h via INTRAVENOUS
  Administered 2017-06-16: 5 mg/h via INTRAVENOUS
  Filled 2017-06-09 (×15): qty 10

## 2017-06-09 MED ORDER — CHLORHEXIDINE GLUCONATE 0.12 % MT SOLN
OROMUCOSAL | Status: AC
  Filled 2017-06-09: qty 15

## 2017-06-09 MED ORDER — SODIUM CHLORIDE 0.9 % IV BOLUS (SEPSIS)
1000.0000 mL | Freq: Once | INTRAVENOUS | Status: AC
  Administered 2017-06-09: 1000 mL via INTRAVENOUS

## 2017-06-09 MED ORDER — POTASSIUM CHLORIDE 20 MEQ/15ML (10%) PO SOLN
40.0000 meq | Freq: Once | ORAL | Status: AC
  Administered 2017-06-09: 40 meq via ORAL
  Filled 2017-06-09: qty 30

## 2017-06-09 MED ORDER — MAGNESIUM SULFATE 2 GM/50ML IV SOLN
2.0000 g | Freq: Once | INTRAVENOUS | Status: AC
  Administered 2017-06-09: 2 g via INTRAVENOUS
  Filled 2017-06-09: qty 50

## 2017-06-09 MED ORDER — K PHOS MONO-SOD PHOS DI & MONO 155-852-130 MG PO TABS
250.0000 mg | ORAL_TABLET | Freq: Once | ORAL | Status: AC
  Administered 2017-06-09: 250 mg via ORAL
  Filled 2017-06-09: qty 1

## 2017-06-09 MED ORDER — MIDAZOLAM HCL 2 MG/2ML IJ SOLN
2.0000 mg | INTRAMUSCULAR | Status: DC | PRN
  Administered 2017-06-14 – 2017-06-17 (×3): 2 mg via INTRAVENOUS
  Filled 2017-06-09 (×2): qty 2

## 2017-06-09 MED ORDER — PIVOT 1.5 CAL PO LIQD
1000.0000 mL | ORAL | Status: DC
  Administered 2017-06-09: 1000 mL

## 2017-06-09 NOTE — Progress Notes (Signed)
Patient ID: Briana [name], female   DOB: 04/24/1996, 22 y.o.   MRN: [id number] I spoke with the CCM team and I updated her father.  Briana GelinasBurke [name], MD, MPH, FACS Trauma: [phone] General Surgery: [phone]

## 2017-06-09 NOTE — Progress Notes (Signed)
RT note-ETT advanced 3cm, from 21 to 24 per order, BBS.

## 2017-06-09 NOTE — Progress Notes (Signed)
eLink Physician-Brief Progress Note Patient Name: Briana [name] DOB: 10/18/1995 MRN: [id number]   Date of Service  06/09/2017  HPI/Events of Note  Ventilator dys synchrony - Currently on Fentanyl, Propofol and Versed IV infusions.   eICU Interventions  Will order: 1. Titrate Propofol IV infusion as ordered. 2. Increase ceiling on Versed IV infusion to 8 mg/hour. Titrate to RASS = 0 to -1.  3. Versed 2 mg IV Q 2 hours PRN agitation or sedation.      Intervention Category Major Interventions: Respiratory failure - evaluation and management  Sommer,Steven Eugene 06/09/2017, 9:41 PM
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
## 2017-06-09 NOTE — Progress Notes (Signed)
RT note-sputum obtained for culture.

## 2017-06-10 ENCOUNTER — Encounter (HOSPITAL_COMMUNITY): Payer: Self-pay | Admitting: Student

## 2017-06-10 ENCOUNTER — Inpatient Hospital Stay (HOSPITAL_COMMUNITY): Payer: No Typology Code available for payment source

## 2017-06-10 DIAGNOSIS — J9601 Acute respiratory failure with hypoxia: Secondary | ICD-10-CM

## 2017-06-10 DIAGNOSIS — J939 Pneumothorax, unspecified: Secondary | ICD-10-CM

## 2017-06-10 DIAGNOSIS — J80 Acute respiratory distress syndrome: Secondary | ICD-10-CM

## 2017-06-10 DIAGNOSIS — R0902 Hypoxemia: Secondary | ICD-10-CM

## 2017-06-10 LAB — BLOOD GAS, ARTERIAL
Acid-Base Excess: 5.3 mmol/L — ABNORMAL HIGH (ref 0.0–2.0)
Bicarbonate: 28.8 mmol/L — ABNORMAL HIGH (ref 20.0–28.0)
Drawn by: 44135
FIO2: 40
MECHVT: 350 mL
O2 Saturation: 98.7 %
PEEP: 5 cmH2O
Patient temperature: 100.3
RATE: 24 resp/min
pCO2 arterial: 40.4 mmHg (ref 32.0–48.0)
pH, Arterial: 7.471 — ABNORMAL HIGH (ref 7.350–7.450)
pO2, Arterial: 140 mmHg — ABNORMAL HIGH (ref 83.0–108.0)

## 2017-06-10 LAB — BLOOD CULTURE ID PANEL (REFLEXED)

## 2017-06-10 LAB — GLUCOSE, CAPILLARY
GLUCOSE-CAPILLARY: 125 mg/dL — AB (ref 65–99)
GLUCOSE-CAPILLARY: 128 mg/dL — AB (ref 65–99)
Glucose-Capillary: 109 mg/dL — ABNORMAL HIGH (ref 65–99)
Glucose-Capillary: 114 mg/dL — ABNORMAL HIGH (ref 65–99)
Glucose-Capillary: 132 mg/dL — ABNORMAL HIGH (ref 65–99)
Glucose-Capillary: 165 mg/dL — ABNORMAL HIGH (ref 65–99)

## 2017-06-10 LAB — BASIC METABOLIC PANEL
ANION GAP: 8 (ref 5–15)
BUN: 9 mg/dL (ref 6–20)
CHLORIDE: 104 mmol/L (ref 101–111)
CO2: 26 mmol/L (ref 22–32)
Calcium: 8.4 mg/dL — ABNORMAL LOW (ref 8.9–10.3)
Creatinine, Ser: <0.3 mg/dL — ABNORMAL LOW (ref 0.44–1.00)
Glucose, Bld: 102 mg/dL — ABNORMAL HIGH (ref 65–99)
POTASSIUM: 3.7 mmol/L (ref 3.5–5.1)
SODIUM: 138 mmol/L (ref 135–145)

## 2017-06-10 LAB — URINALYSIS, ROUTINE W REFLEX MICROSCOPIC
BILIRUBIN URINE: NEGATIVE
Glucose, UA: 100 mg/dL — AB
Hgb urine dipstick: NEGATIVE
KETONES UR: NEGATIVE mg/dL
LEUKOCYTES UA: NEGATIVE
Nitrite: NEGATIVE
PROTEIN: 30 mg/dL — AB
Specific Gravity, Urine: 1.015 (ref 1.005–1.030)
pH: >9 — ABNORMAL HIGH (ref 5.0–8.0)

## 2017-06-10 LAB — URINALYSIS, MICROSCOPIC (REFLEX): RBC / HPF: NONE SEEN RBC/hpf (ref 0–5)

## 2017-06-10 LAB — URINE CULTURE
Culture: NO GROWTH
Special Requests: NORMAL

## 2017-06-10 LAB — TRIGLYCERIDES: Triglycerides: 457 mg/dL — ABNORMAL HIGH (ref <150)

## 2017-06-10 LAB — CBC
HEMATOCRIT: 31.8 % — AB (ref 36.0–46.0)
Hemoglobin: 10.2 g/dL — ABNORMAL LOW (ref 12.0–15.0)
MCH: 28.3 pg (ref 26.0–34.0)
MCHC: 32.1 g/dL (ref 30.0–36.0)
MCV: 88.3 fL (ref 78.0–100.0)
Platelets: 351 10*3/uL (ref 150–400)
RBC: 3.6 MIL/uL — AB (ref 3.87–5.11)
RDW: 14.6 % (ref 11.5–15.5)
WBC: 12.9 10*3/uL — AB (ref 4.0–10.5)

## 2017-06-10 LAB — MAGNESIUM: Magnesium: 2.2 mg/dL (ref 1.7–2.4)

## 2017-06-10 LAB — PHOSPHORUS: Phosphorus: 1.4 mg/dL — ABNORMAL LOW (ref 2.5–4.6)

## 2017-06-10 MED ORDER — PIVOT 1.5 CAL PO LIQD
1000.0000 mL | ORAL | Status: DC
  Administered 2017-06-10 – 2017-06-16 (×8): 1000 mL

## 2017-06-10 MED ORDER — DEXMEDETOMIDINE HCL IN NACL 200 MCG/50ML IV SOLN
0.0000 ug/kg/h | INTRAVENOUS | Status: DC
  Administered 2017-06-10: 0.4 ug/kg/h via INTRAVENOUS
  Administered 2017-06-10 (×2): 1.5 ug/kg/h via INTRAVENOUS
  Filled 2017-06-10 (×4): qty 50

## 2017-06-10 MED ORDER — GENTAMICIN SULFATE 40 MG/ML IJ SOLN
400.0000 mg | Freq: Once | INTRAMUSCULAR | Status: AC
  Administered 2017-06-10: 400 mg via INTRAVENOUS
  Filled 2017-06-10: qty 10

## 2017-06-10 MED ORDER — DEXTROSE 5 % IV SOLN
2.0000 g | INTRAVENOUS | Status: DC
  Administered 2017-06-10 – 2017-06-12 (×3): 2 g via INTRAVENOUS
  Filled 2017-06-10 (×3): qty 2

## 2017-06-10 MED ORDER — K PHOS MONO-SOD PHOS DI & MONO 155-852-130 MG PO TABS
250.0000 mg | ORAL_TABLET | Freq: Once | ORAL | Status: AC
  Administered 2017-06-10: 250 mg
  Filled 2017-06-10 (×2): qty 1

## 2017-06-10 MED ORDER — QUETIAPINE FUMARATE 25 MG PO TABS
50.0000 mg | ORAL_TABLET | Freq: Three times a day (TID) | ORAL | Status: DC
  Administered 2017-06-10 – 2017-06-12 (×6): 50 mg
  Filled 2017-06-10 (×6): qty 2

## 2017-06-10 MED ORDER — CLONAZEPAM 0.5 MG PO TABS
0.5000 mg | ORAL_TABLET | Freq: Two times a day (BID) | ORAL | Status: DC
  Administered 2017-06-10 – 2017-06-12 (×4): 0.5 mg
  Filled 2017-06-10 (×4): qty 1

## 2017-06-10 MED ORDER — DEXMEDETOMIDINE HCL IN NACL 400 MCG/100ML IV SOLN
0.4000 ug/kg/h | INTRAVENOUS | Status: DC
  Administered 2017-06-10: 1.6 ug/kg/h via INTRAVENOUS
  Administered 2017-06-10: 1.5 ug/kg/h via INTRAVENOUS
  Administered 2017-06-11: 1.8 ug/kg/h via INTRAVENOUS
  Administered 2017-06-11: 1.5 ug/kg/h via INTRAVENOUS
  Administered 2017-06-11: 1.9 ug/kg/h via INTRAVENOUS
  Administered 2017-06-11: 2 ug/kg/h via INTRAVENOUS
  Administered 2017-06-11: 1.7 ug/kg/h via INTRAVENOUS
  Administered 2017-06-11 – 2017-06-12 (×4): 2 ug/kg/h via INTRAVENOUS
  Administered 2017-06-12: 1.4 ug/kg/h via INTRAVENOUS
  Administered 2017-06-12: 2 ug/kg/h via INTRAVENOUS
  Administered 2017-06-12: 1.9 ug/kg/h via INTRAVENOUS
  Administered 2017-06-13: 1.3 ug/kg/h via INTRAVENOUS
  Administered 2017-06-13 (×2): 1.4 ug/kg/h via INTRAVENOUS
  Administered 2017-06-13: 1.6 ug/kg/h via INTRAVENOUS
  Administered 2017-06-14 – 2017-06-15 (×8): 2 ug/kg/h via INTRAVENOUS
  Administered 2017-06-15 (×2): 1 ug/kg/h via INTRAVENOUS
  Administered 2017-06-16: 2 ug/kg/h via INTRAVENOUS
  Administered 2017-06-16: 1.2 ug/kg/h via INTRAVENOUS
  Administered 2017-06-16: 1.8 ug/kg/h via INTRAVENOUS
  Administered 2017-06-16: 1.5 ug/kg/h via INTRAVENOUS
  Administered 2017-06-17 (×2): 2 ug/kg/h via INTRAVENOUS
  Filled 2017-06-10 (×39): qty 100
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
## 2017-06-10 NOTE — Progress Notes (Signed)
MRI not yet performed. Further recommendations pending results of cervical MRI.

## 2017-06-10 NOTE — Progress Notes (Signed)
Follow up - Trauma and Critical Care  Patient Details:    Briana [name] is an 22 y.o. female.  Lines/tubes : Airway 7 mm (Active)  Secured at (cm) 24 cm 06/10/2017  3:35 AM  Measured From Lips 06/10/2017  3:35 AM  Secured Location Left 06/10/2017  3:35 AM  Secured By [employer] 06/10/2017  3:35 AM  Tube Holder Repositioned Yes 06/10/2017  3:35 AM  Cuff Pressure (cm H2O) 28 cm H2O 06/10/2017  3:35 AM  Site Condition Dry 06/09/2017 11:35 PM     CVC Triple Lumen 05/31/17 Right Subclavian (Active)  Indication for Insertion or Continuance of Line Vasoactive infusions;Prolonged intravenous therapies 06/10/2017  8:00 AM  Site Assessment Clean;Dry;Intact 06/09/2017  8:00 AM  Proximal Lumen Status Infusing 06/09/2017  8:00 PM  Medial Lumen Status Infusing 06/09/2017  8:00 PM  Distal Lumen Status Infusing;In-line blood sampling system in place 06/09/2017  8:00 PM  Dressing Type Transparent;Occlusive 06/09/2017  8:00 PM  Dressing Status Clean;Dry;Intact;Antimicrobial disc in place 06/09/2017  8:00 PM  Line Care Connections checked and tightened;Zeroed and calibrated;Leveled 06/09/2017  8:00 AM  Dressing Intervention Dressing changed 06/07/2017  6:00 PM  Dressing Change Due 06/14/17 06/09/2017  8:00 PM     Arterial Line 05/31/17 Right Brachial (Active)  Site Assessment Dry;Intact 06/09/2017  8:00 PM  Line Status Pulsatile blood flow 06/09/2017  8:00 PM  Art Line Waveform Appropriate 06/09/2017  8:00 PM  Art Line Interventions Zeroed and calibrated;Connections checked and tightened 06/09/2017  8:00 PM  Color/Movement/Sensation Capillary refill less than 3 sec 06/09/2017  8:00 PM  Dressing Type Transparent;Occlusive 06/09/2017  8:00 PM  Dressing Status Clean;Dry;Intact 06/09/2017  8:00 PM  Interventions New dressing;Dressing changed 06/09/2017  1:00 PM  Dressing Change Due 06/16/17 06/09/2017  8:00 PM     Chest Tube 1 Right;Anterior;Lateral 14 Fr. (Active)  Suction -20 cm H2O 06/09/2017  8:00 PM  Chest Tube Air Leak None 06/09/2017   8:00 PM  Patency Intervention Tip/tilt 06/09/2017  8:00 AM  Drainage Description Serosanguineous 06/09/2017  8:00 PM  Dressing Status Dry;Intact;Old drainage 06/09/2017  8:00 PM  Dressing Intervention Dressing changed 06/04/2017  9:20 AM  Site Assessment Clean;Dry;Intact 06/09/2017  8:00 PM  Surrounding Skin Unable to view 06/09/2017  8:00 PM  Output (mL) 0 mL 06/10/2017  6:00 AM     Negative Pressure Wound Therapy Leg Anterior;Lower;Right (Active)  Site / Wound Assessment Dressing in place / Unable to assess 06/09/2017  8:00 PM  Peri-wound Assessment Intact 06/08/2017  8:00 AM  Cycle Continuous 06/09/2017  8:00 PM  Target Pressure (mmHg) 125 06/09/2017  8:00 PM  Canister Changed No 06/09/2017  8:00 AM  Dressing Status Intact 06/09/2017  8:00 PM  Drainage Amount None 06/09/2017  8:00 PM  Drainage Description Serosanguineous 06/08/2017  8:00 PM  Output (mL) 0 mL 06/10/2017  6:00 AM     NG/OG Tube Orogastric Center mouth (Active)  External Length of Tube (cm) - (if applicable) 70 cm 06/07/2017  8:00 AM  Site Assessment Clean;Dry;Intact 06/09/2017  8:00 PM  Ongoing Placement Verification No change in cm markings or external length of tube from initial placement;No change in respiratory status;No acute changes, not attributed to clinical condition 06/09/2017  8:00 PM  Status Infusing tube feed 06/09/2017  8:00 PM  Drainage Appearance Bile 06/05/2017  8:00 PM  Intake (mL) 30 mL 06/07/2017  8:00 PM  Output (mL) 140 mL 06/02/2017  6:00 PM     Urethral Catheter (Active)  Indication for Insertion  or Continuance of Catheter Peri-operative use for selective surgical procedure;Unstable critical patients (first 24-48 hours) 06/10/2017  8:00 AM  Site Assessment Clean;Intact;Dry 06/09/2017  8:00 PM  Catheter Maintenance Bag below level of bladder;Catheter secured;Drainage bag/tubing not touching floor;Insertion date on drainage bag;No dependent loops;Seal intact 06/10/2017  8:00 AM  Collection Container Standard drainage bag 06/09/2017  8:00 PM   Securement Method Securing device (Describe) 06/09/2017  8:00 PM  Urinary Catheter Interventions Unclamped 06/09/2017  8:00 PM  Output (mL) 400 mL 06/10/2017  8:00 AM    Microbiology/Sepsis markers: Results for orders placed or performed during the hospital encounter of 05/30/17  MRSA PCR Screening     Status: None   Collection Time: 05/30/17  1:58 PM  Result Value Ref Range Status   MRSA by PCR NEGATIVE NEGATIVE Final    Comment:        The GeneXpert MRSA Assay (FDA approved for NASAL specimens only), is one component of a comprehensive MRSA colonization surveillance program. It is not intended to diagnose MRSA infection nor to guide or monitor treatment for MRSA infections.   Culture, respiratory (NON-Expectorated)     Status: None   Collection Time: 06/05/17  8:19 AM  Result Value Ref Range Status   Specimen Description TRACHEAL ASPIRATE  Final   Special Requests NONE  Final   Gram Stain   Final    ABUNDANT WBC PRESENT, PREDOMINANTLY PMN MODERATE GRAM POSITIVE COCCI IN PAIRS FEW GRAM NEGATIVE RODS FEW GRAM POSITIVE RODS FEW GRAM NEGATIVE DIPLOCOCCI    Culture FEW Consistent with normal respiratory flora.  Final   Report Status 06/07/2017 FINAL  Final  Culture, blood (Routine X 2) w Reflex to ID Panel     Status: None (Preliminary result)   Collection Time: 06/07/17 11:00 AM  Result Value Ref Range Status   Specimen Description BLOOD RIGHT HAND  Final   Special Requests   Final    BOTTLES DRAWN AEROBIC ONLY Blood Culture results may not be optimal due to an inadequate volume of blood received in culture bottles   Culture NO GROWTH 2 DAYS  Final   Report Status PENDING  Incomplete  Culture, blood (Routine X 2) w Reflex to ID Panel     Status: None (Preliminary result)   Collection Time: 06/07/17  4:02 PM  Result Value Ref Range Status   Specimen Description BLOOD LEFT HAND  Final   Special Requests IN PEDIATRIC BOTTLE Blood Culture adequate volume  Final   Culture NO  GROWTH 2 DAYS  Final   Report Status PENDING  Incomplete  Culture, blood (routine x 2)     Status: None (Preliminary result)   Collection Time: 06/09/17  1:20 PM  Result Value Ref Range Status   Specimen Description BLOOD RIGHT HAND  Final   Special Requests IN PEDIATRIC BOTTLE Blood Culture adequate volume  Final   Culture  Setup Time   Final    GRAM NEGATIVE RODS IN PEDIATRIC BOTTLE Organism ID to follow CRITICAL RESULT CALLED TO, READ BACK BY AND VERIFIED WITH: A MASTERS PHARMD 0415 06/10/17 A BROWNING    Culture PENDING  Incomplete   Report Status PENDING  Incomplete  Blood Culture ID Panel (Reflexed)     Status: Abnormal   Collection Time: 06/09/17  1:20 PM  Result Value Ref Range Status   Enterococcus species NOT DETECTED NOT DETECTED Final   Listeria monocytogenes NOT DETECTED NOT DETECTED Final   Staphylococcus species NOT DETECTED NOT DETECTED Final   Staphylococcus  aureus NOT DETECTED NOT DETECTED Final   Streptococcus species NOT DETECTED NOT DETECTED Final   Streptococcus agalactiae NOT DETECTED NOT DETECTED Final   Streptococcus pneumoniae NOT DETECTED NOT DETECTED Final   Streptococcus pyogenes NOT DETECTED NOT DETECTED Final   Acinetobacter baumannii NOT DETECTED NOT DETECTED Final   Enterobacteriaceae species DETECTED (A) NOT DETECTED Final    Comment: Enterobacteriaceae represent a large family of gram-negative bacteria, not a single organism. CRITICAL RESULT CALLED TO, READ BACK BY AND VERIFIED WITH: A MASTERS PHARMD 0415 06/10/17 A BROWNING    Enterobacter cloacae complex NOT DETECTED NOT DETECTED Final   Escherichia coli NOT DETECTED NOT DETECTED Final   Klebsiella oxytoca NOT DETECTED NOT DETECTED Final   Klebsiella pneumoniae NOT DETECTED NOT DETECTED Final   Proteus species NOT DETECTED NOT DETECTED Final   Serratia marcescens DETECTED (A) NOT DETECTED Final    Comment: CRITICAL RESULT CALLED TO, READ BACK BY AND VERIFIED WITH: A MASTERS PHARMD 0415 06/10/17 A  BROWNING    Carbapenem resistance NOT DETECTED NOT DETECTED Final   Haemophilus influenzae NOT DETECTED NOT DETECTED Final   Neisseria meningitidis NOT DETECTED NOT DETECTED Final   Pseudomonas aeruginosa NOT DETECTED NOT DETECTED Final   Candida albicans NOT DETECTED NOT DETECTED Final   Candida glabrata NOT DETECTED NOT DETECTED Final   Candida krusei NOT DETECTED NOT DETECTED Final   Candida parapsilosis NOT DETECTED NOT DETECTED Final   Candida tropicalis NOT DETECTED NOT DETECTED Final  Culture, blood (routine x 2)     Status: None (Preliminary result)   Collection Time: 06/09/17  1:27 PM  Result Value Ref Range Status   Specimen Description BLOOD RIGHT HAND  Final   Special Requests IN PEDIATRIC BOTTLE Blood Culture adequate volume  Final   Culture  Setup Time   Final    GRAM NEGATIVE RODS IN PEDIATRIC BOTTLE CRITICAL RESULT CALLED TO, READ BACK BY AND VERIFIED WITH: A MASTERS PHARMD 0415 06/10/17 A BROWNING    Culture PENDING  Incomplete   Report Status PENDING  Incomplete  Culture, respiratory (NON-Expectorated)     Status: None (Preliminary result)   Collection Time: 06/09/17  2:36 PM  Result Value Ref Range Status   Specimen Description TRACHEAL ASPIRATE  Final   Special Requests Normal  Final   Gram Stain   Final    FEW WBC PRESENT, PREDOMINANTLY PMN RARE GRAM NEGATIVE RODS RARE GRAM POSITIVE COCCI IN CLUSTERS    Culture PENDING  Incomplete   Report Status PENDING  Incomplete    Anti-infectives:  Anti-infectives (From admission, onward)   Start     Dose/Rate Route Frequency Ordered Stop   06/10/17 0500  gentamicin (GARAMYCIN) 400 mg in dextrose 5 % 100 mL IVPB     400 mg 110 mL/hr over 60 Minutes Intravenous  Once 06/10/17 0436 06/10/17 0550   06/08/17 1243  vancomycin (VANCOCIN) powder  Status:  Discontinued       As needed 06/08/17 1243 06/08/17 1349   06/07/17 1000  vancomycin (VANCOCIN) 1,250 mg in sodium chloride 0.9 % 250 mL IVPB     1,250 mg 166.7 mL/hr  over 90 Minutes Intravenous Every 8 hours 06/07/17 0804     06/05/17 1700  vancomycin (VANCOCIN) 1,250 mg in sodium chloride 0.9 % 250 mL IVPB  Status:  Discontinued     1,250 mg 166.7 mL/hr over 90 Minutes Intravenous Every 8 hours 06/05/17 1625 06/07/17 0746   06/05/17 0830  piperacillin-tazobactam (ZOSYN) IVPB 3.375 g  3.375 g 12.5 mL/hr over 240 Minutes Intravenous Every 8 hours 06/05/17 0803     06/03/17 1600  vancomycin (VANCOCIN) IVPB 1000 mg/200 mL premix  Status:  Discontinued     1,000 mg 200 mL/hr over 60 Minutes Intravenous Every 12 hours 06/03/17 1417 06/05/17 1625   06/03/17 1226  tobramycin (NEBCIN) powder  Status:  Discontinued       As needed 06/03/17 1227 06/03/17 1326   06/03/17 1225  vancomycin (VANCOCIN) powder  Status:  Discontinued       As needed 06/03/17 1226 06/03/17 1326   05/30/17 0930  ceFAZolin (ANCEF) IVPB 1 g/50 mL premix  Status:  Discontinued     1 g 100 mL/hr over 30 Minutes Intravenous Every 8 hours 05/30/17 0917 06/03/17 1417   05/30/17 0600  ceFAZolin (ANCEF) IVPB 1 g/50 mL premix     1 g 100 mL/hr over 30 Minutes Intravenous  Once 05/30/17 0556 05/30/17 0742      Best Practice/Protocols:  VTE Prophylaxis: Lovenox (prophylaxtic dose) and Mechanical GI Prophylaxis: Proton Pump Inhibitor Continous Sedation ARDS resolved.  Triglycerides ar high, so will stop the Propofol  Consults: Treatment Team:  Md, Trauma, MD Haddix, Gillie Manners, MD Ditty, Loura Halt, MD    Events:  Subjective:    Overnight Issues: Weaning well this AM  Objective:  Vital signs for last 24 hours: Temp:  [98.4 F (36.9 C)-102.2 F (39 C)] 100.4 F (38 C) (01/09 0900) Pulse Rate:  [112-133] 112 (01/09 0700) Resp:  [16-31] 21 (01/09 0900) BP: (89-159)/(45-104) 108/73 (01/09 0900) SpO2:  [95 %-100 %] 100 % (01/09 0700) Arterial Line BP: (102-164)/(47-91) 126/61 (01/09 0900) FiO2 (%):  [40 %-50 %] 40 % (01/09 0700) Weight:  [52.9 kg (116 lb 10 oz)] 52.9 kg  (116 lb 10 oz) (01/09 0400)  Hemodynamic parameters for last 24 hours:    Intake/Output from previous day: 01/08 0701 - 01/09 0700 In: 4403.6 [I.V.:2863.6; NG/GT:780; IV Piggyback:760] Out: 5825 [Urine:5825]  Intake/Output this shift: Total I/O In: 229.2 [I.V.:149.2; NG/GT:80] Out: 400 [Urine:400]  Vent settings for last 24 hours: Vent Mode: PRVC FiO2 (%):  [40 %-50 %] 40 % Set Rate:  [24 bmp-30 bmp] 24 bmp Vt Set:  [350 mL] 350 mL PEEP:  [5 cmH20-8 cmH20] 5 cmH20 Plateau Pressure:  [12 cmH20-15 cmH20] 15 cmH20  Physical Exam:  General: no respiratory distress and Well sedated currently. Neuro: RASS -1 and RASS -2 Resp: rhonchi bilaterally and CXR is markedly imm0proved from prrevious.  No PTX (or tiny apical PTX).  No air leak CVS: Sinus tachycardia GI: soft, nontender, BS WNL, no r/g and tolerating tube feedings. well Extremities: edema 1+  Results for orders placed or performed during the hospital encounter of 05/30/17 (from the past 24 hour(s))  Glucose, capillary     Status: Abnormal   Collection Time: 06/09/17 12:09 PM  Result Value Ref Range   Glucose-Capillary 105 (H) 65 - 99 mg/dL  I-STAT 3, arterial blood gas (G3+)     Status: Abnormal   Collection Time: 06/09/17 12:45 PM  Result Value Ref Range   pH, Arterial 7.459 (H) 7.350 - 7.450   pCO2 arterial 48.6 (H) 32.0 - 48.0 mmHg   pO2, Arterial 130.0 (H) 83.0 - 108.0 mmHg   Bicarbonate 34.0 (H) 20.0 - 28.0 mmol/L   TCO2 35 (H) 22 - 32 mmol/L   O2 Saturation 99.0 %   Acid-Base Excess 10.0 (H) 0.0 - 2.0 mmol/L   Patient temperature 102.0 F  Collection site ARTERIAL LINE    Sample type ARTERIAL   Culture, blood (routine x 2)     Status: None (Preliminary result)   Collection Time: 06/09/17  1:20 PM  Result Value Ref Range   Specimen Description BLOOD RIGHT HAND    Special Requests IN PEDIATRIC BOTTLE Blood Culture adequate volume    Culture  Setup Time      GRAM NEGATIVE RODS IN PEDIATRIC BOTTLE Organism  ID to follow CRITICAL RESULT CALLED TO, READ BACK BY AND VERIFIED WITH: A MASTERS PHARMD 0415 06/10/17 A BROWNING    Culture PENDING    Report Status PENDING   Blood Culture ID Panel (Reflexed)     Status: Abnormal   Collection Time: 06/09/17  1:20 PM  Result Value Ref Range   Enterococcus species NOT DETECTED NOT DETECTED   Listeria monocytogenes NOT DETECTED NOT DETECTED   Staphylococcus species NOT DETECTED NOT DETECTED   Staphylococcus aureus NOT DETECTED NOT DETECTED   Streptococcus species NOT DETECTED NOT DETECTED   Streptococcus agalactiae NOT DETECTED NOT DETECTED   Streptococcus pneumoniae NOT DETECTED NOT DETECTED   Streptococcus pyogenes NOT DETECTED NOT DETECTED   Acinetobacter baumannii NOT DETECTED NOT DETECTED   Enterobacteriaceae species DETECTED (A) NOT DETECTED   Enterobacter cloacae complex NOT DETECTED NOT DETECTED   Escherichia coli NOT DETECTED NOT DETECTED   Klebsiella oxytoca NOT DETECTED NOT DETECTED   Klebsiella pneumoniae NOT DETECTED NOT DETECTED   Proteus species NOT DETECTED NOT DETECTED   Serratia marcescens DETECTED (A) NOT DETECTED   Carbapenem resistance NOT DETECTED NOT DETECTED   Haemophilus influenzae NOT DETECTED NOT DETECTED   Neisseria meningitidis NOT DETECTED NOT DETECTED   Pseudomonas aeruginosa NOT DETECTED NOT DETECTED   Candida albicans NOT DETECTED NOT DETECTED   Candida glabrata NOT DETECTED NOT DETECTED   Candida krusei NOT DETECTED NOT DETECTED   Candida parapsilosis NOT DETECTED NOT DETECTED   Candida tropicalis NOT DETECTED NOT DETECTED  Culture, blood (routine x 2)     Status: None (Preliminary result)   Collection Time: 06/09/17  1:27 PM  Result Value Ref Range   Specimen Description BLOOD RIGHT HAND    Special Requests IN PEDIATRIC BOTTLE Blood Culture adequate volume    Culture  Setup Time      GRAM NEGATIVE RODS IN PEDIATRIC BOTTLE CRITICAL RESULT CALLED TO, READ BACK BY AND VERIFIED WITH: A MASTERS PHARMD 0415 06/10/17 A  BROWNING    Culture PENDING    Report Status PENDING   Culture, respiratory (NON-Expectorated)     Status: None (Preliminary result)   Collection Time: 06/09/17  2:36 PM  Result Value Ref Range   Specimen Description TRACHEAL ASPIRATE    Special Requests Normal    Gram Stain      FEW WBC PRESENT, PREDOMINANTLY PMN RARE GRAM NEGATIVE RODS RARE GRAM POSITIVE COCCI IN CLUSTERS    Culture PENDING    Report Status PENDING   Glucose, capillary     Status: None   Collection Time: 06/09/17  3:52 PM  Result Value Ref Range   Glucose-Capillary 95 65 - 99 mg/dL   Comment 1 Notify RN    Comment 2 Document in Chart   I-STAT 3, arterial blood gas (G3+)     Status: Abnormal   Collection Time: 06/09/17  5:15 PM  Result Value Ref Range   pH, Arterial 7.479 (H) 7.350 - 7.450   pCO2 arterial 42.5 32.0 - 48.0 mmHg   pO2, Arterial 150.0 (H) 83.0 -  108.0 mmHg   Bicarbonate 31.4 (H) 20.0 - 28.0 mmol/L   TCO2 33 (H) 22 - 32 mmol/L   O2 Saturation 99.0 %   Acid-Base Excess 7.0 (H) 0.0 - 2.0 mmol/L   Patient temperature 99.5 F    Collection site ARTERIAL LINE    Sample type ARTERIAL   Glucose, capillary     Status: Abnormal   Collection Time: 06/09/17  8:03 PM  Result Value Ref Range   Glucose-Capillary 106 (H) 65 - 99 mg/dL  Glucose, capillary     Status: Abnormal   Collection Time: 06/09/17 11:39 PM  Result Value Ref Range   Glucose-Capillary 127 (H) 65 - 99 mg/dL  Blood gas, arterial     Status: Abnormal   Collection Time: 06/10/17  3:35 AM  Result Value Ref Range   FIO2 40.00    Delivery systems VENTILATOR    Mode PRESSURE REGULATED VOLUME CONTROL    VT 350 mL   LHR 24 resp/min   Peep/cpap 5.0 cm H20   pH, Arterial 7.471 (H) 7.350 - 7.450   pCO2 arterial 40.4 32.0 - 48.0 mmHg   pO2, Arterial 140 (H) 83.0 - 108.0 mmHg   Bicarbonate 28.8 (H) 20.0 - 28.0 mmol/L   Acid-Base Excess 5.3 (H) 0.0 - 2.0 mmol/L   O2 Saturation 98.7 %   Patient temperature 100.3    Collection site A-LINE     Drawn by [phone]    Sample type ARTERIAL    Allens test (pass/fail) NOT INDICATED (A) PASS  Glucose, capillary     Status: Abnormal   Collection Time: 06/10/17  3:56 AM  Result Value Ref Range   Glucose-Capillary 125 (H) 65 - 99 mg/dL  CBC     Status: Abnormal   Collection Time: 06/10/17  4:41 AM  Result Value Ref Range   WBC 12.9 (H) 4.0 - 10.5 K/uL   RBC 3.60 (L) 3.87 - 5.11 MIL/uL   Hemoglobin 10.2 (L) 12.0 - 15.0 g/dL   HCT 60.4 (L) 54.0 - 98.1 %   MCV 88.3 78.0 - 100.0 fL   MCH 28.3 26.0 - 34.0 pg   MCHC 32.1 30.0 - 36.0 g/dL   RDW 19.1 47.8 - 29.5 %   Platelets 351 150 - 400 K/uL  Basic metabolic panel     Status: Abnormal   Collection Time: 06/10/17  4:41 AM  Result Value Ref Range   Sodium 138 135 - 145 mmol/L   Potassium 3.7 3.5 - 5.1 mmol/L   Chloride 104 101 - 111 mmol/L   CO2 26 22 - 32 mmol/L   Glucose, Bld 102 (H) 65 - 99 mg/dL   BUN 9 6 - 20 mg/dL   Creatinine, Ser <6.21 (L) 0.44 - 1.00 mg/dL   Calcium 8.4 (L) 8.9 - 10.3 mg/dL   GFR calc non Af Amer NOT CALCULATED >60 mL/min   GFR calc Af Amer NOT CALCULATED >60 mL/min   Anion gap 8 5 - 15  Magnesium     Status: None   Collection Time: 06/10/17  4:41 AM  Result Value Ref Range   Magnesium 2.2 1.7 - 2.4 mg/dL  Phosphorus     Status: Abnormal   Collection Time: 06/10/17  4:41 AM  Result Value Ref Range   Phosphorus 1.4 (L) 2.5 - 4.6 mg/dL  Triglycerides     Status: Abnormal   Collection Time: 06/10/17  4:41 AM  Result Value Ref Range   Triglycerides 457 (H) <150 mg/dL  Glucose,  capillary     Status: Abnormal   Collection Time: 06/10/17  8:17 AM  Result Value Ref Range   Glucose-Capillary 109 (H) 65 - 99 mg/dL     Assessment/Plan:   NEURO  Altered Mental Status:  sedation   Plan: On sedation and will try to wean this off  PULM  Atelectasis/collapse (focal and mostly on the right but improved from previously)   Plan: CPM.  Will try to wean towards extubation.  ABG this AM is excellent  CARDIO   Sinus Tachycardia   Plan: No specific treatment  RENAL  Urine output and renal function are good.   Plan: CPM  GI  No specific issues   Plan: CPM.  Continue tube feedings until extubation  ID  empiric treatment for pneumonia.  Cultures are still not finalized.   Plan: CPM.  Will ask Dr. Jena Gauss about continuing Vancomycin in light of possible orthopedic wound infection  HEME  Anemia acute blood loss anemia)   Plan: Does not require transfusion.  ENDO No specific issue   Plan: CPM  Global Issues  Improved overall.  CT can go to waterseal.  Will repeat CXR tomorrow.  Stop propofol for hypertriglyceridemia, will use precedex.if needed, but will  Try to wean all and get extubated int he near future.    LOS: 11 days   Additional comments:I reviewed the patient's new clinical lab test results. cbc/bmet/abg, I reviewed the patients new imaging test results. cxr and I have discussed and reviewed with family members patient's family members.  Critical Care Total Time*: 45 Minutes  Jimmye Norman 06/10/2017  *Care during the described time interval was provided by me and/or other providers on the critical care team.  I have reviewed this patient's available data, including medical history, events of note, physical examination and test results as part of my evaluation.

## 2017-06-10 NOTE — Progress Notes (Signed)
PHARMACY - PHYSICIAN COMMUNICATION CRITICAL VALUE ALERT - BLOOD CULTURE IDENTIFICATION (BCID)  Briana [name] is an 22 y.o. female [facility] with L lower extremity wound infection. Initially started on broad abx for wound infection +/- possible pneumonia. Now with 2/2 GNR in blood culture, BCID reading as Serratia.  Name of physician (or Provider) Contacted: CCM   Current antibiotics:  Add Gentamicin x 1 dose  Changes to prescribed antibiotics recommended:  Patient is on recommended antibiotics - No changes needed  Results for orders placed or performed during the hospital encounter of 05/30/17  Blood Culture ID Panel (Reflexed) (Collected: 06/09/2017  1:20 PM)  Result Value Ref Range   Enterococcus species NOT DETECTED NOT DETECTED   Listeria monocytogenes NOT DETECTED NOT DETECTED   Staphylococcus species NOT DETECTED NOT DETECTED   Staphylococcus aureus NOT DETECTED NOT DETECTED   Streptococcus species NOT DETECTED NOT DETECTED   Streptococcus agalactiae NOT DETECTED NOT DETECTED   Streptococcus pneumoniae NOT DETECTED NOT DETECTED   Streptococcus pyogenes NOT DETECTED NOT DETECTED   Acinetobacter baumannii NOT DETECTED NOT DETECTED   Enterobacteriaceae species DETECTED (A) NOT DETECTED   Enterobacter cloacae complex NOT DETECTED NOT DETECTED   Escherichia coli NOT DETECTED NOT DETECTED   Klebsiella oxytoca NOT DETECTED NOT DETECTED   Klebsiella pneumoniae NOT DETECTED NOT DETECTED   Proteus species NOT DETECTED NOT DETECTED   Serratia marcescens DETECTED (A) NOT DETECTED   Carbapenem resistance NOT DETECTED NOT DETECTED   Haemophilus influenzae NOT DETECTED NOT DETECTED   Neisseria meningitidis NOT DETECTED NOT DETECTED   Pseudomonas aeruginosa NOT DETECTED NOT DETECTED   Candida albicans NOT DETECTED NOT DETECTED   Candida glabrata NOT DETECTED NOT DETECTED   Candida krusei NOT DETECTED NOT DETECTED   Candida parapsilosis NOT DETECTED  NOT DETECTED   Candida tropicalis NOT DETECTED NOT DETECTED    Baldemar FridayMasters, Artelia Game M 06/10/2017  4:36 AM

## 2017-06-11 ENCOUNTER — Inpatient Hospital Stay (HOSPITAL_COMMUNITY): Payer: No Typology Code available for payment source

## 2017-06-11 DIAGNOSIS — S298XXA Other specified injuries of thorax, initial encounter: Secondary | ICD-10-CM

## 2017-06-11 DIAGNOSIS — S270XXA Traumatic pneumothorax, initial encounter: Secondary | ICD-10-CM

## 2017-06-11 DIAGNOSIS — S270XXD Traumatic pneumothorax, subsequent encounter: Secondary | ICD-10-CM

## 2017-06-11 LAB — BASIC METABOLIC PANEL
Anion gap: 12 (ref 5–15)
BUN: 12 mg/dL (ref 6–20)
CHLORIDE: 106 mmol/L (ref 101–111)
CO2: 22 mmol/L (ref 22–32)
Calcium: 8.4 mg/dL — ABNORMAL LOW (ref 8.9–10.3)
Creatinine, Ser: 0.32 mg/dL — ABNORMAL LOW (ref 0.44–1.00)
GFR calc Af Amer: >60 mL/min (ref >60)
GFR calc non Af Amer: >60 mL/min (ref >60)
Glucose, Bld: 113 mg/dL — ABNORMAL HIGH (ref 65–99)
POTASSIUM: 3.7 mmol/L (ref 3.5–5.1)
SODIUM: 140 mmol/L (ref 135–145)

## 2017-06-11 LAB — GLUCOSE, CAPILLARY
GLUCOSE-CAPILLARY: 128 mg/dL — AB (ref 65–99)
GLUCOSE-CAPILLARY: 132 mg/dL — AB (ref 65–99)
GLUCOSE-CAPILLARY: 123 mg/dL — AB (ref 65–99)
Glucose-Capillary: 117 mg/dL — ABNORMAL HIGH (ref 65–99)
Glucose-Capillary: 138 mg/dL — ABNORMAL HIGH (ref 65–99)

## 2017-06-11 LAB — TRIGLYCERIDES: Triglycerides: 229 mg/dL — ABNORMAL HIGH (ref <150)

## 2017-06-11 MED ORDER — ALBUMIN HUMAN 25 % IV SOLN: 25.0000 g | Freq: Once | INTRAVENOUS | Status: DC

## 2017-06-11 MED ORDER — ALBUMIN HUMAN 5 % IV SOLN
12.5000 g | Freq: Once | INTRAVENOUS | Status: AC
  Administered 2017-06-11: 12.5 g via INTRAVENOUS
  Filled 2017-06-11: qty 500

## 2017-06-11 MED ORDER — ALBUMIN HUMAN 25 % IV SOLN
25.0000 g | Freq: Once | INTRAVENOUS | Status: AC
  Administered 2017-06-11: 25 g via INTRAVENOUS
  Filled 2017-06-11: qty 100

## 2017-06-11 MED ORDER — POTASSIUM CHLORIDE 20 MEQ/15ML (10%) PO SOLN
40.0000 meq | Freq: Once | ORAL | Status: AC
Start: 2017-06-11 — End: 2017-06-11
  Administered 2017-06-11: 40 meq
  Filled 2017-06-11: qty 30

## 2017-06-11 MED ORDER — LORAZEPAM 2 MG/ML IJ SOLN
INTRAMUSCULAR | Status: AC
  Administered 2017-06-11: 1 mg
  Filled 2017-06-11: qty 1
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
## 2017-06-11 NOTE — Progress Notes (Signed)
Arms assessed for PIV.  Very poor veins multiple brusing and open areas.  Not able to get suitable PIV to replace multiple needs of TL CVC.

## 2017-06-11 NOTE — Progress Notes (Signed)
No new recommendations Awaiting MRI when patient more stable
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
## 2017-06-11 NOTE — Plan of Care (Signed)
Patient signs of infection. Patient is able to ventilate adequately on ventilator.

## 2017-06-12 ENCOUNTER — Inpatient Hospital Stay (HOSPITAL_COMMUNITY): Payer: No Typology Code available for payment source

## 2017-06-12 LAB — CBC
HEMATOCRIT: 31.6 % — AB (ref 36.0–46.0)
Hemoglobin: 10.1 g/dL — ABNORMAL LOW (ref 12.0–15.0)
MCH: 27.5 pg (ref 26.0–34.0)
MCHC: 32 g/dL (ref 30.0–36.0)
MCV: 86.1 fL (ref 78.0–100.0)
PLATELETS: 453 10*3/uL — AB (ref 150–400)
RBC: 3.67 MIL/uL — ABNORMAL LOW (ref 3.87–5.11)
RDW: 14.8 % (ref 11.5–15.5)
WBC: 17.4 10*3/uL — ABNORMAL HIGH (ref 4.0–10.5)

## 2017-06-12 LAB — GLUCOSE, CAPILLARY
GLUCOSE-CAPILLARY: 92 mg/dL (ref 65–99)
GLUCOSE-CAPILLARY: 115 mg/dL — AB (ref 65–99)
GLUCOSE-CAPILLARY: 109 mg/dL — AB (ref 65–99)
Glucose-Capillary: 120 mg/dL — ABNORMAL HIGH (ref 65–99)
Glucose-Capillary: 89 mg/dL (ref 65–99)
Glucose-Capillary: 99 mg/dL (ref 65–99)

## 2017-06-12 LAB — BASIC METABOLIC PANEL
Anion gap: 11 (ref 5–15)
BUN: 13 mg/dL (ref 6–20)
CALCIUM: 9.2 mg/dL (ref 8.9–10.3)
CO2: 21 mmol/L — AB (ref 22–32)
CREATININE: 0.31 mg/dL — AB (ref 0.44–1.00)
Chloride: 103 mmol/L (ref 101–111)
GFR calc Af Amer: >60 mL/min (ref >60)
GFR calc non Af Amer: >60 mL/min (ref >60)
GLUCOSE: 136 mg/dL — AB (ref 65–99)
Potassium: 4 mmol/L (ref 3.5–5.1)
Sodium: 135 mmol/L (ref 135–145)

## 2017-06-12 LAB — CULTURE, BLOOD (ROUTINE X 2)
CULTURE: NO GROWTH
Culture: NO GROWTH
SPECIAL REQUESTS: ADEQUATE
SPECIAL REQUESTS: ADEQUATE
Special Requests: ADEQUATE

## 2017-06-12 LAB — POCT I-STAT 3, ART BLOOD GAS (G3+)
ACID-BASE DEFICIT: 1 mmol/L (ref 0.0–2.0)
Acid-base deficit: 1 mmol/L (ref 0.0–2.0)
BICARBONATE: 22.6 mmol/L (ref 20.0–28.0)
Bicarbonate: 21.5 mmol/L (ref 20.0–28.0)
O2 SAT: 99 %
O2 Saturation: 100 %
PCO2 ART: 32 mmHg (ref 32.0–48.0)
PH ART: 7.45 (ref 7.350–7.450)
PH ART: 7.456 — AB (ref 7.350–7.450)
PO2 ART: 145 mmHg — AB (ref 83.0–108.0)
PO2 ART: 167 mmHg — AB (ref 83.0–108.0)
Patient temperature: 98.6
TCO2: 22 mmol/L (ref 22–32)
TCO2: 24 mmol/L (ref 22–32)
pCO2 arterial: 31.2 mmHg — ABNORMAL LOW (ref 32.0–48.0)

## 2017-06-12 LAB — PHOSPHORUS: PHOSPHORUS: 4 mg/dL (ref 2.5–4.6)

## 2017-06-12 MED ORDER — ALBUMIN HUMAN 5 % IV SOLN
25.0000 g | Freq: Once | INTRAVENOUS | Status: AC
  Administered 2017-06-12: 25 g via INTRAVENOUS
  Filled 2017-06-12: qty 250

## 2017-06-12 MED ORDER — CHLORHEXIDINE GLUCONATE CLOTH 2 % EX PADS
6.0000 | MEDICATED_PAD | Freq: Every day | CUTANEOUS | Status: DC
  Administered 2017-06-12 – 2017-06-14 (×3): 6 via TOPICAL

## 2017-06-12 MED ORDER — CLONAZEPAM 0.1 MG/ML ORAL SUSPENSION: 0.5000 mg | Freq: Once | ORAL | Status: DC

## 2017-06-12 MED ORDER — QUETIAPINE FUMARATE 25 MG PO TABS
50.0000 mg | ORAL_TABLET | Freq: Once | ORAL | Status: AC
Start: 2017-06-12 — End: 2017-06-12
  Administered 2017-06-12: 50 mg
  Filled 2017-06-12: qty 2

## 2017-06-12 MED ORDER — CLONAZEPAM 1 MG PO TABS
1.0000 mg | ORAL_TABLET | Freq: Two times a day (BID) | ORAL | Status: DC
  Administered 2017-06-12 – 2017-06-14 (×4): 1 mg
  Filled 2017-06-12 (×4): qty 1

## 2017-06-12 MED ORDER — FUROSEMIDE 10 MG/ML IJ SOLN
40.0000 mg | Freq: Once | INTRAMUSCULAR | Status: AC
  Administered 2017-06-12: 40 mg via INTRAVENOUS
  Filled 2017-06-12: qty 4

## 2017-06-12 MED ORDER — QUETIAPINE FUMARATE 100 MG PO TABS
100.0000 mg | ORAL_TABLET | Freq: Three times a day (TID) | ORAL | Status: DC
  Administered 2017-06-12 – 2017-06-18 (×15): 100 mg
  Filled 2017-06-12 (×16): qty 1

## 2017-06-12 MED ORDER — POTASSIUM CHLORIDE 20 MEQ/15ML (10%) PO SOLN
20.0000 meq | Freq: Once | ORAL | Status: AC
  Administered 2017-06-12: 20 meq
  Filled 2017-06-12: qty 15

## 2017-06-12 MED ORDER — CLONAZEPAM 0.5 MG PO TABS
0.5000 mg | ORAL_TABLET | Freq: Once | ORAL | Status: AC
  Administered 2017-06-12: 0.5 mg via ORAL
  Filled 2017-06-12: qty 1

## 2017-06-12 MED ORDER — ALBUMIN HUMAN 5 % IV SOLN
INTRAVENOUS | Status: AC
  Filled 2017-06-12: qty 250

## 2017-06-12 MED ORDER — ALBUMIN HUMAN 25 % IV SOLN: 25.0000 g | Freq: Once | INTRAVENOUS | Status: DC

## 2017-06-12 MED ORDER — HYDROMORPHONE HCL PF 10 MG/ML IJ SOLN
0.2500 mg/h | INTRAMUSCULAR | Status: DC
Start: 2017-06-12 — End: 2017-06-17
  Administered 2017-06-12: 1 mg/h via INTRAVENOUS
  Administered 2017-06-16: 3 mg/h via INTRAVENOUS
  Filled 2017-06-12 (×3): qty 20

## 2017-06-12 MED ORDER — DEXTROSE 5 % IV SOLN
1.0000 g | Freq: Three times a day (TID) | INTRAVENOUS | Status: DC
  Administered 2017-06-12 – 2017-06-15 (×8): 1 g via INTRAVENOUS
  Filled 2017-06-12 (×11): qty 1

## 2017-06-12 MED ORDER — LEVOFLOXACIN IN D5W 750 MG/150ML IV SOLN
750.0000 mg | INTRAVENOUS | Status: DC
  Administered 2017-06-12 – 2017-06-18 (×8): 750 mg via INTRAVENOUS
  Filled 2017-06-12 (×8): qty 150

## 2017-06-12 MED ORDER — HYDROMORPHONE HCL 1 MG/ML IJ SOLN
1.0000 mg | Freq: Once | INTRAMUSCULAR | Status: AC
  Administered 2017-06-12: 1 mg via INTRAVENOUS
  Filled 2017-06-12: qty 1
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
## 2017-06-12 NOTE — Progress Notes (Signed)
50 ml fentanyl drip wasted in pt room sink. Witnessed by Benita GutterJoshua McDaniel, RN.

## 2017-06-12 NOTE — Progress Notes (Signed)
Follow up - Trauma and Critical Care  Patient Details:    Briana [name] is an 22 y.o. female.  Lines/tubes : Airway 7 mm (Active)  Secured at (cm) 24 cm 06/12/2017  8:09 AM  Measured From Lips 06/12/2017  8:09 AM  Secured Location Center 06/12/2017  8:09 AM  Secured By [employer] 06/12/2017  8:09 AM  Tube Holder Repositioned Yes 06/12/2017  8:09 AM  Cuff Pressure (cm H2O) 26 cm H2O 06/11/2017  7:51 PM  Site Condition Dry 06/12/2017  3:08 AM     CVC Triple Lumen 05/31/17 Right Subclavian (Active)  Indication for Insertion or Continuance of Line Vasoactive infusions;Prolonged intravenous therapies 06/11/2017  8:00 PM  Site Assessment Clean;Dry;Intact 06/11/2017  8:00 PM  Proximal Lumen Status Infusing 06/11/2017  8:00 PM  Medial Lumen Status Infusing 06/11/2017  8:00 PM  Distal Lumen Status In-line blood sampling system in place 06/11/2017  8:00 PM  Dressing Type Transparent;Occlusive 06/11/2017  8:00 PM  Dressing Status Clean;Dry;Intact;Antimicrobial disc in place 06/11/2017  8:00 PM  Line Care Connections checked and tightened 06/11/2017  8:00 PM  Dressing Intervention Dressing changed 06/07/2017  6:00 PM  Dressing Change Due 06/14/17 06/11/2017  8:00 PM     Arterial Line 05/31/17 Right Brachial (Active)  Site Assessment Clean;Intact;Dry 06/11/2017  8:00 PM  Line Status Pulsatile blood flow 06/11/2017  8:00 PM  Art Line Waveform Appropriate 06/11/2017  8:00 PM  Art Line Interventions Zeroed and calibrated 06/11/2017  8:00 PM  Color/Movement/Sensation Capillary refill less than 3 sec 06/11/2017  8:00 PM  Dressing Type Transparent;Occlusive 06/11/2017  8:00 PM  Dressing Status Clean;Dry;Intact;Antimicrobial disc in place 06/11/2017  8:00 PM  Interventions New dressing;Dressing changed 06/09/2017  1:00 PM  Dressing Change Due 06/16/17 06/11/2017  8:00 PM     Negative Pressure Wound Therapy Leg Anterior;Lower;Right (Active)  Site / Wound Assessment Dressing in place / Unable to assess 06/11/2017   8:00 PM  Peri-wound Assessment Intact 06/11/2017  8:00 PM  Cycle Continuous;On 06/11/2017  8:00 PM  Target Pressure (mmHg) 125 06/11/2017  8:00 PM  Canister Changed No 06/11/2017  8:00 PM  Dressing Status Intact 06/11/2017  8:00 PM  Drainage Amount None 06/11/2017  8:00 PM  Drainage Description Serosanguineous 06/11/2017  8:00 PM  Output (mL) 0 mL 06/11/2017  5:40 PM     NG/OG Tube Orogastric Center mouth (Active)  External Length of Tube (cm) - (if applicable) 70 cm 06/11/2017  8:00 PM  Site Assessment Clean;Dry;Intact 06/11/2017  8:00 PM  Ongoing Placement Verification No change in respiratory status;No acute changes, not attributed to clinical condition 06/11/2017  8:00 PM  Status Infusing tube feed 06/11/2017  8:00 PM  Drainage Appearance Bile 06/05/2017  8:00 PM  Intake (mL) 60 mL 06/11/2017  4:00 PM  Output (mL) 140 mL 06/02/2017  6:00 PM     Urethral Catheter (Active)  Indication for Insertion or Continuance of Catheter Unstable critical patients (first 24-48 hours) 06/11/2017  8:00 PM  Site Assessment Clean;Intact 06/11/2017  8:00 PM  Catheter Maintenance Bag below level of bladder;Drainage bag/tubing not touching floor;Catheter secured;Insertion date on drainage bag;No dependent loops;Seal intact;Bag emptied prior to transport 06/12/2017  8:00 AM  Collection Container Standard drainage bag 06/11/2017  8:00 PM  Securement Method Securing device (Describe) 06/11/2017  8:00 PM  Urinary Catheter Interventions Unclamped 06/11/2017  8:00 PM  Output (mL) 350 mL 06/12/2017  6:00 AM    Microbiology/Sepsis markers: Results for orders placed or performed during the hospital encounter of  05/30/17  MRSA PCR Screening     Status: None   Collection Time: 05/30/17  1:58 PM  Result Value Ref Range Status   MRSA by PCR NEGATIVE NEGATIVE Final    Comment:        The GeneXpert MRSA Assay (FDA approved for NASAL specimens only), is one component of a comprehensive MRSA colonization surveillance program. It is  not intended to diagnose MRSA infection nor to guide or monitor treatment for MRSA infections.   Culture, respiratory (NON-Expectorated)     Status: None   Collection Time: 06/05/17  8:19 AM  Result Value Ref Range Status   Specimen Description TRACHEAL ASPIRATE  Final   Special Requests NONE  Final   Gram Stain   Final    ABUNDANT WBC PRESENT, PREDOMINANTLY PMN MODERATE GRAM POSITIVE COCCI IN PAIRS FEW GRAM NEGATIVE RODS FEW GRAM POSITIVE RODS FEW GRAM NEGATIVE DIPLOCOCCI    Culture FEW Consistent with normal respiratory flora.  Final   Report Status 06/07/2017 FINAL  Final  Culture, blood (Routine X 2) w Reflex to ID Panel     Status: None (Preliminary result)   Collection Time: 06/07/17 11:00 AM  Result Value Ref Range Status   Specimen Description BLOOD RIGHT HAND  Final   Special Requests   Final    BOTTLES DRAWN AEROBIC ONLY Blood Culture results may not be optimal due to an inadequate volume of blood received in culture bottles   Culture NO GROWTH 4 DAYS  Final   Report Status PENDING  Incomplete  Culture, blood (Routine X 2) w Reflex to ID Panel     Status: None (Preliminary result)   Collection Time: 06/07/17  4:02 PM  Result Value Ref Range Status   Specimen Description BLOOD LEFT HAND  Final   Special Requests IN PEDIATRIC BOTTLE Blood Culture adequate volume  Final   Culture NO GROWTH 4 DAYS  Final   Report Status PENDING  Incomplete  Culture, blood (routine x 2)     Status: Abnormal   Collection Time: 06/09/17  1:20 PM  Result Value Ref Range Status   Specimen Description BLOOD RIGHT HAND  Final   Special Requests IN PEDIATRIC BOTTLE Blood Culture adequate volume  Final   Culture  Setup Time   Final    GRAM NEGATIVE RODS IN PEDIATRIC BOTTLE CRITICAL RESULT CALLED TO, READ BACK BY AND VERIFIED WITH: A MASTERS PHARMD 0415 06/10/17 A BROWNING    Culture SERRATIA MARCESCENS (A)  Final   Report Status 06/12/2017 FINAL  Final   Organism ID, Bacteria SERRATIA  MARCESCENS  Final      Susceptibility   Serratia marcescens - MIC*    CEFAZOLIN >=64 RESISTANT Resistant     CEFEPIME <=1 SENSITIVE Sensitive     CEFTAZIDIME <=1 SENSITIVE Sensitive     CEFTRIAXONE <=1 SENSITIVE Sensitive     CIPROFLOXACIN <=0.25 SENSITIVE Sensitive     GENTAMICIN <=1 SENSITIVE Sensitive     TRIMETH/SULFA <=20 SENSITIVE Sensitive     * SERRATIA MARCESCENS  Blood Culture ID Panel (Reflexed)     Status: Abnormal   Collection Time: 06/09/17  1:20 PM  Result Value Ref Range Status   Enterococcus species NOT DETECTED NOT DETECTED Final   Listeria monocytogenes NOT DETECTED NOT DETECTED Final   Staphylococcus species NOT DETECTED NOT DETECTED Final   Staphylococcus aureus NOT DETECTED NOT DETECTED Final   Streptococcus species NOT DETECTED NOT DETECTED Final   Streptococcus agalactiae NOT DETECTED NOT DETECTED Final  Streptococcus pneumoniae NOT DETECTED NOT DETECTED Final   Streptococcus pyogenes NOT DETECTED NOT DETECTED Final   Acinetobacter baumannii NOT DETECTED NOT DETECTED Final   Enterobacteriaceae species DETECTED (A) NOT DETECTED Final    Comment: Enterobacteriaceae represent a large family of gram-negative bacteria, not a single organism. CRITICAL RESULT CALLED TO, READ BACK BY AND VERIFIED WITH: A MASTERS PHARMD 0415 06/10/17 A BROWNING    Enterobacter cloacae complex NOT DETECTED NOT DETECTED Final   Escherichia coli NOT DETECTED NOT DETECTED Final   Klebsiella oxytoca NOT DETECTED NOT DETECTED Final   Klebsiella pneumoniae NOT DETECTED NOT DETECTED Final   Proteus species NOT DETECTED NOT DETECTED Final   Serratia marcescens DETECTED (A) NOT DETECTED Final    Comment: CRITICAL RESULT CALLED TO, READ BACK BY AND VERIFIED WITH: A MASTERS PHARMD 0415 06/10/17 A BROWNING    Carbapenem resistance NOT DETECTED NOT DETECTED Final   Haemophilus influenzae NOT DETECTED NOT DETECTED Final   Neisseria meningitidis NOT DETECTED NOT DETECTED Final   Pseudomonas  aeruginosa NOT DETECTED NOT DETECTED Final   Candida albicans NOT DETECTED NOT DETECTED Final   Candida glabrata NOT DETECTED NOT DETECTED Final   Candida krusei NOT DETECTED NOT DETECTED Final   Candida parapsilosis NOT DETECTED NOT DETECTED Final   Candida tropicalis NOT DETECTED NOT DETECTED Final  Culture, blood (routine x 2)     Status: Abnormal   Collection Time: 06/09/17  1:27 PM  Result Value Ref Range Status   Specimen Description BLOOD RIGHT HAND  Final   Special Requests IN PEDIATRIC BOTTLE Blood Culture adequate volume  Final   Culture  Setup Time   Final    GRAM NEGATIVE RODS IN PEDIATRIC BOTTLE CRITICAL RESULT CALLED TO, READ BACK BY AND VERIFIED WITH: A MASTERS PHARMD 0415 06/10/17 A BROWNING    Culture (A)  Final    SERRATIA MARCESCENS SUSCEPTIBILITIES PERFORMED ON PREVIOUS CULTURE WITHIN THE LAST 5 DAYS.    Report Status 06/12/2017 FINAL  Final  Culture, Urine     Status: None   Collection Time: 06/09/17  2:22 PM  Result Value Ref Range Status   Specimen Description URINE, CATHETERIZED  Final   Special Requests Normal  Final   Culture NO GROWTH  Final   Report Status 06/10/2017 FINAL  Final  Culture, respiratory (NON-Expectorated)     Status: None (Preliminary result)   Collection Time: 06/09/17  2:36 PM  Result Value Ref Range Status   Specimen Description TRACHEAL ASPIRATE  Final   Special Requests Normal  Final   Gram Stain   Final    FEW WBC PRESENT, PREDOMINANTLY PMN RARE GRAM NEGATIVE RODS RARE GRAM POSITIVE COCCI IN CLUSTERS    Culture CULTURE REINCUBATED FOR BETTER GROWTH  Final   Report Status PENDING  Incomplete    Anti-infectives:  Anti-infectives (From admission, onward)   Start     Dose/Rate Route Frequency Ordered Stop   06/10/17 1200  cefTRIAXone (ROCEPHIN) 2 g in dextrose 5 % 50 mL IVPB     2 g 100 mL/hr over 30 Minutes Intravenous Every 24 hours 06/10/17 1135     06/10/17 0500  gentamicin (GARAMYCIN) 400 mg in dextrose 5 % 100 mL IVPB      400 mg 110 mL/hr over 60 Minutes Intravenous  Once 06/10/17 0436 06/10/17 0550   06/08/17 1243  vancomycin (VANCOCIN) powder  Status:  Discontinued       As needed 06/08/17 1243 06/08/17 1349   06/07/17 1000  vancomycin (VANCOCIN)  1,250 mg in sodium chloride 0.9 % 250 mL IVPB     1,250 mg 166.7 mL/hr over 90 Minutes Intravenous Every 8 hours 06/07/17 0804     06/05/17 1700  vancomycin (VANCOCIN) 1,250 mg in sodium chloride 0.9 % 250 mL IVPB  Status:  Discontinued     1,250 mg 166.7 mL/hr over 90 Minutes Intravenous Every 8 hours 06/05/17 1625 06/07/17 0746   06/05/17 0830  piperacillin-tazobactam (ZOSYN) IVPB 3.375 g  Status:  Discontinued     3.375 g 12.5 mL/hr over 240 Minutes Intravenous Every 8 hours 06/05/17 0803 06/10/17 0933   06/03/17 1600  vancomycin (VANCOCIN) IVPB 1000 mg/200 mL premix  Status:  Discontinued     1,000 mg 200 mL/hr over 60 Minutes Intravenous Every 12 hours 06/03/17 1417 06/05/17 1625   06/03/17 1226  tobramycin (NEBCIN) powder  Status:  Discontinued       As needed 06/03/17 1227 06/03/17 1326   06/03/17 1225  vancomycin (VANCOCIN) powder  Status:  Discontinued       As needed 06/03/17 1226 06/03/17 1326   05/30/17 0930  ceFAZolin (ANCEF) IVPB 1 g/50 mL premix  Status:  Discontinued     1 g 100 mL/hr over 30 Minutes Intravenous Every 8 hours 05/30/17 0917 06/03/17 1417   05/30/17 0600  ceFAZolin (ANCEF) IVPB 1 g/50 mL premix     1 g 100 mL/hr over 30 Minutes Intravenous  Once 05/30/17 0556 05/30/17 1610      Best Practice/Protocols:  VTE Prophylaxis: Lovenox (prophylaxtic dose) and Mechanical GI Prophylaxis: Proton Pump Inhibitor Continous Sedation High doses of Precedex, Versed and fentanyl.  Patient still somewhat fighting the ventilator  Consults: Treatment Team:  Md, Trauma, MD Haddix, Gillie Manners, MD Ditty, Loura Halt, MD    Events:  Subjective:    Overnight Issues: No new issues.  WBC is increased today.  Objective:  Vital signs  for last 24 hours: Temp:  [99 F (37.2 C)-102.7 F (39.3 C)] 99.3 F (37.4 C) (01/11 0700) Pulse Rate:  [72-158] 126 (01/11 1000) Resp:  [16-34] 28 (01/11 1000) BP: (78-136)/(44-100) 100/60 (01/11 1000) SpO2:  [95 %-100 %] 99 % (01/11 1000) Arterial Line BP: (74-154)/(43-84) 111/65 (01/10 1900) FiO2 (%):  [40 %] 40 % (01/11 1000) Weight:  [55.1 kg (121 lb 7.6 oz)] 55.1 kg (121 lb 7.6 oz) (01/11 0341)  Hemodynamic parameters for last 24 hours: CVP:  [4 mmHg-11 mmHg] 11 mmHg  Intake/Output from previous day: 01/10 0701 - 01/11 0700 In: 5860.9 [I.V.:3930.9; NG/GT:1380; IV Piggyback:550] Out: 4750 [Urine:4750]  Intake/Output this shift: No intake/output data recorded.  Vent settings for last 24 hours: Vent Mode: PRVC FiO2 (%):  [40 %] 40 % Set Rate:  [24 bmp] 24 bmp Vt Set:  [350 mL] 350 mL PEEP:  [5 cmH20] 5 cmH20 Pressure Support:  [8 cmH20] 8 cmH20 Plateau Pressure:  [11 cmH20-15 cmH20] 15 cmH20  Physical Exam:  General: no respiratory distress and Fighting the ventilator a bit. Neuro: nonfocal exam and agitated Resp: rhonchi bilaterally CVS: Tachycardic GI: Tolerating tube feedings. Extremities: no edema, no erythema, pulses WNL and No changes  Results for orders placed or performed during the hospital encounter of 05/30/17 (from the past 24 hour(s))  Glucose, capillary     Status: Abnormal   Collection Time: 06/11/17 11:16 AM  Result Value Ref Range   Glucose-Capillary 128 (H) 65 - 99 mg/dL  Glucose, capillary     Status: Abnormal   Collection Time: 06/11/17  3:58  PM  Result Value Ref Range   Glucose-Capillary 132 (H) 65 - 99 mg/dL  Glucose, capillary     Status: Abnormal   Collection Time: 06/11/17  8:03 PM  Result Value Ref Range   Glucose-Capillary 123 (H) 65 - 99 mg/dL  Glucose, capillary     Status: None   Collection Time: 06/12/17 12:43 AM  Result Value Ref Range   Glucose-Capillary 99 65 - 99 mg/dL   Comment 1 Notify RN    Comment 2 Document in Chart    Glucose, capillary     Status: None   Collection Time: 06/12/17  3:16 AM  Result Value Ref Range   Glucose-Capillary 89 65 - 99 mg/dL   Comment 1 Notify RN    Comment 2 Document in Chart   CBC     Status: Abnormal   Collection Time: 06/12/17  5:00 AM  Result Value Ref Range   WBC 17.4 (H) 4.0 - 10.5 K/uL   RBC 3.67 (L) 3.87 - 5.11 MIL/uL   Hemoglobin 10.1 (L) 12.0 - 15.0 g/dL   HCT 16.1 (L) 09.6 - 04.5 %   MCV 86.1 78.0 - 100.0 fL   MCH 27.5 26.0 - 34.0 pg   MCHC 32.0 30.0 - 36.0 g/dL   RDW 40.9 81.1 - 91.4 %   Platelets 453 (H) 150 - 400 K/uL  Basic metabolic panel     Status: Abnormal   Collection Time: 06/12/17  5:00 AM  Result Value Ref Range   Sodium 135 135 - 145 mmol/L   Potassium 4.0 3.5 - 5.1 mmol/L   Chloride 103 101 - 111 mmol/L   CO2 21 (L) 22 - 32 mmol/L   Glucose, Bld 136 (H) 65 - 99 mg/dL   BUN 13 6 - 20 mg/dL   Creatinine, Ser 7.82 (L) 0.44 - 1.00 mg/dL   Calcium 9.2 8.9 - 95.6 mg/dL   GFR calc non Af Amer >60 >60 mL/min   GFR calc Af Amer >60 >60 mL/min   Anion gap 11 5 - 15  Glucose, capillary     Status: None   Collection Time: 06/12/17  7:56 AM  Result Value Ref Range   Glucose-Capillary 92 65 - 99 mg/dL     Assessment/Plan:   NEURO  Altered Mental Status:  agitation, delirium and sedation   Plan: Patient has been very difficult to sedat with super high doses of medications to keep her calm on the ventilator  PULM  Atelectasis/collapse (focal and Mostly RLL)   Plan: Will check ABG soon and consider putting her on more conventional ventilator settings   CARDIO  Sinus Tachycardia   Plan: Certainly gets worse when she is manipulated.  RENAL  Urine output and renal functiion is great, has been given some lasix.   Plan: CPM  GI  No specific issues   Plan: CPM  ID  Bacteremia (gram negative rod and Serratia, source unknown)   Plan: CPM with IV Rocephin  HEME  Mild anemia.  WBC is increased   Plan: CPM.  No blood  ENDO No specific issues    Plan: CPM  Global Issues  Not able to keep well sedated.  Will check ABG in about 30 minutes with Vt increased to 450.  Albumin for BP as patient gets more Lasix.      LOS: 13 days   Additional comments:I reviewed the patient's new clinical lab test results. cbc/bmet and I reviewed the patients new imaging test results. cxr  Critical Care Total Time*: 30 Minutes  Jimmye NormanJames Isidoro Santillana 06/12/2017  *Care during the described time interval was provided by me and/or other providers on the critical care team.  I have reviewed this patient's available data, including medical history, events of note, physical examination and test results as part of my evaluation.

## 2017-06-13 ENCOUNTER — Inpatient Hospital Stay (HOSPITAL_COMMUNITY): Payer: No Typology Code available for payment source

## 2017-06-13 DIAGNOSIS — J80 Acute respiratory distress syndrome: Secondary | ICD-10-CM

## 2017-06-13 LAB — COMPREHENSIVE METABOLIC PANEL
ALBUMIN: 3.1 g/dL — AB (ref 3.5–5.0)
ALK PHOS: 213 U/L — AB (ref 38–126)
ALT: 77 U/L — ABNORMAL HIGH (ref 14–54)
ANION GAP: 9 (ref 5–15)
AST: 99 U/L — ABNORMAL HIGH (ref 15–41)
BILIRUBIN TOTAL: 3.5 mg/dL — AB (ref 0.3–1.2)
BUN: 14 mg/dL (ref 6–20)
CALCIUM: 8.8 mg/dL — AB (ref 8.9–10.3)
CO2: 22 mmol/L (ref 22–32)
Chloride: 105 mmol/L (ref 101–111)
Creatinine, Ser: 0.33 mg/dL — ABNORMAL LOW (ref 0.44–1.00)
GFR calc Af Amer: >60 mL/min (ref >60)
GLUCOSE: 126 mg/dL — AB (ref 65–99)
Potassium: 4.3 mmol/L (ref 3.5–5.1)
Sodium: 136 mmol/L (ref 135–145)
TOTAL PROTEIN: 6.9 g/dL (ref 6.5–8.1)

## 2017-06-13 LAB — GLUCOSE, CAPILLARY
GLUCOSE-CAPILLARY: 125 mg/dL — AB (ref 65–99)
GLUCOSE-CAPILLARY: 149 mg/dL — AB (ref 65–99)
Glucose-Capillary: 131 mg/dL — ABNORMAL HIGH (ref 65–99)
Glucose-Capillary: 126 mg/dL — ABNORMAL HIGH (ref 65–99)
Glucose-Capillary: 102 mg/dL — ABNORMAL HIGH (ref 65–99)

## 2017-06-13 LAB — CULTURE, RESPIRATORY W GRAM STAIN

## 2017-06-13 LAB — CBC
HCT: 27.9 % — ABNORMAL LOW (ref 36.0–46.0)
Hemoglobin: 9.3 g/dL — ABNORMAL LOW (ref 12.0–15.0)
MCH: 28.5 pg (ref 26.0–34.0)
MCHC: 33.3 g/dL (ref 30.0–36.0)
MCV: 85.6 fL (ref 78.0–100.0)
Platelets: 222 10*3/uL (ref 150–400)
RBC: 3.26 MIL/uL — ABNORMAL LOW (ref 3.87–5.11)
RDW: 14.7 % (ref 11.5–15.5)
WBC: 16 10*3/uL — AB (ref 4.0–10.5)

## 2017-06-13 LAB — CULTURE, RESPIRATORY: SPECIAL REQUESTS: NORMAL

## 2017-06-13 MED ORDER — CHLORHEXIDINE GLUCONATE 0.12% ORAL RINSE (MEDLINE KIT)
15.0000 mL | Freq: Two times a day (BID) | OROMUCOSAL | Status: DC
  Administered 2017-06-13 – 2017-06-17 (×8): 15 mL via OROMUCOSAL

## 2017-06-13 MED ORDER — ALBUMIN HUMAN 5 % IV SOLN
25.0000 g | Freq: Once | INTRAVENOUS | Status: AC
  Administered 2017-06-13: 25 g via INTRAVENOUS
  Filled 2017-06-13: qty 500

## 2017-06-13 MED ORDER — ORAL CARE MOUTH RINSE
15.0000 mL | OROMUCOSAL | Status: DC
  Administered 2017-06-13 – 2017-06-17 (×37): 15 mL via OROMUCOSAL
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
## 2017-06-14 ENCOUNTER — Encounter (HOSPITAL_COMMUNITY): Payer: Self-pay | Admitting: Radiology

## 2017-06-14 ENCOUNTER — Inpatient Hospital Stay (HOSPITAL_COMMUNITY): Payer: No Typology Code available for payment source

## 2017-06-14 DIAGNOSIS — S82142A Displaced bicondylar fracture of left tibia, initial encounter for closed fracture: Secondary | ICD-10-CM

## 2017-06-14 DIAGNOSIS — S82871A Displaced pilon fracture of right tibia, initial encounter for closed fracture: Secondary | ICD-10-CM

## 2017-06-14 DIAGNOSIS — S32592A Other specified fracture of left pubis, initial encounter for closed fracture: Secondary | ICD-10-CM

## 2017-06-14 DIAGNOSIS — S32591A Other specified fracture of right pubis, initial encounter for closed fracture: Secondary | ICD-10-CM

## 2017-06-14 DIAGNOSIS — S36113S Laceration of liver, unspecified degree, sequela: Secondary | ICD-10-CM

## 2017-06-14 DIAGNOSIS — R0600 Dyspnea, unspecified: Secondary | ICD-10-CM

## 2017-06-14 LAB — CBC WITH DIFFERENTIAL/PLATELET
Basophils Absolute: 0.1 10*3/uL (ref 0.0–0.1)
Basophils Relative: 1 %
EOS ABS: 0.1 10*3/uL (ref 0.0–0.7)
EOS PCT: 1 %
HCT: 26.1 % — ABNORMAL LOW (ref 36.0–46.0)
Hemoglobin: 8.5 g/dL — ABNORMAL LOW (ref 12.0–15.0)
LYMPHS ABS: 2.1 10*3/uL (ref 0.7–4.0)
Lymphocytes Relative: 15 %
MCH: 28.1 pg (ref 26.0–34.0)
MCHC: 32.6 g/dL (ref 30.0–36.0)
MCV: 86.1 fL (ref 78.0–100.0)
MONO ABS: 0.9 10*3/uL (ref 0.1–1.0)
Monocytes Relative: 6 %
NEUTROS PCT: 77 %
Neutro Abs: 11.1 10*3/uL — ABNORMAL HIGH (ref 1.7–7.7)
PLATELETS: 441 10*3/uL — AB (ref 150–400)
RBC: 3.03 MIL/uL — AB (ref 3.87–5.11)
RDW: 15 % (ref 11.5–15.5)
WBC: 14.3 10*3/uL — AB (ref 4.0–10.5)

## 2017-06-14 LAB — GLUCOSE, CAPILLARY
GLUCOSE-CAPILLARY: 106 mg/dL — AB (ref 65–99)
GLUCOSE-CAPILLARY: 112 mg/dL — AB (ref 65–99)
GLUCOSE-CAPILLARY: 116 mg/dL — AB (ref 65–99)
GLUCOSE-CAPILLARY: 134 mg/dL — AB (ref 65–99)
Glucose-Capillary: 127 mg/dL — ABNORMAL HIGH (ref 65–99)
Glucose-Capillary: 134 mg/dL — ABNORMAL HIGH (ref 65–99)
Glucose-Capillary: 118 mg/dL — ABNORMAL HIGH (ref 65–99)

## 2017-06-14 LAB — BASIC METABOLIC PANEL
Anion gap: 11 (ref 5–15)
BUN: 19 mg/dL (ref 6–20)
CALCIUM: 8.9 mg/dL (ref 8.9–10.3)
CHLORIDE: 106 mmol/L (ref 101–111)
CO2: 21 mmol/L — ABNORMAL LOW (ref 22–32)
CREATININE: 0.38 mg/dL — AB (ref 0.44–1.00)
Glucose, Bld: 127 mg/dL — ABNORMAL HIGH (ref 65–99)
Potassium: 4 mmol/L (ref 3.5–5.1)
Sodium: 138 mmol/L (ref 135–145)

## 2017-06-14 LAB — ECHOCARDIOGRAM COMPLETE
HEIGHTINCHES: 65 in
WEIGHTICAEL: 1897.72 [oz_av]

## 2017-06-14 MED ORDER — CHLORHEXIDINE GLUCONATE 0.12 % MT SOLN
OROMUCOSAL | Status: AC
  Administered 2017-06-14: 15 mL via OROMUCOSAL
  Filled 2017-06-14: qty 15

## 2017-06-14 MED ORDER — CLONAZEPAM 1 MG PO TABS
2.0000 mg | ORAL_TABLET | Freq: Two times a day (BID) | ORAL | Status: DC
  Administered 2017-06-14 – 2017-06-18 (×8): 2 mg
  Filled 2017-06-14 (×8): qty 2

## 2017-06-14 MED ORDER — LACTATED RINGERS IV BOLUS (SEPSIS)
750.0000 mL | Freq: Once | INTRAVENOUS | Status: AC
  Administered 2017-06-14: 750 mL via INTRAVENOUS

## 2017-06-14 MED ORDER — CHLORHEXIDINE GLUCONATE CLOTH 2 % EX PADS: 6.0000 | MEDICATED_PAD | Freq: Every day | CUTANEOUS | Status: DC

## 2017-06-14 MED ORDER — SODIUM CHLORIDE 0.9% FLUSH: 10.0000 mL | INTRAVENOUS | Status: DC | PRN

## 2017-06-14 MED ORDER — IOPAMIDOL (ISOVUE-300) INJECTION 61%
15.0000 mL | INTRAVENOUS | Status: AC
  Administered 2017-06-14 (×2): 15 mL via ORAL

## 2017-06-14 MED ORDER — IOPAMIDOL (ISOVUE-300) INJECTION 61%
INTRAVENOUS | Status: AC
  Administered 2017-06-14: 100 mL
  Filled 2017-06-14: qty 100

## 2017-06-14 MED ORDER — CHLORHEXIDINE GLUCONATE CLOTH 2 % EX PADS
6.0000 | MEDICATED_PAD | Freq: Every day | CUTANEOUS | Status: DC
  Administered 2017-06-15 – 2017-06-17 (×3): 6 via TOPICAL

## 2017-06-14 NOTE — Progress Notes (Signed)
Pt transported on vent to CT and returned to 4N29.  Pt's vitals remained stable throughout the trip.

## 2017-06-14 NOTE — Progress Notes (Signed)
Vent changes made by NP. 

## 2017-06-14 NOTE — Progress Notes (Signed)
*   Echocardiogram 2D Echocardiogram has been performed.  Lanai Conlee T Candus Braud 06/14/2017, 9:05 AM
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
## 2017-06-14 NOTE — Progress Notes (Signed)
Pt placed back on full vent support due to increased HR.  RN notified.

## 2017-06-15 ENCOUNTER — Inpatient Hospital Stay (HOSPITAL_COMMUNITY): Payer: No Typology Code available for payment source

## 2017-06-15 ENCOUNTER — Inpatient Hospital Stay (HOSPITAL_COMMUNITY): Payer: No Typology Code available for payment source | Admitting: Certified Registered"

## 2017-06-15 ENCOUNTER — Encounter (HOSPITAL_COMMUNITY): Admission: EM | Disposition: A | Discharge status: Rehabilitation Facility | Payer: Self-pay | Source: Home / Self Care

## 2017-06-15 DIAGNOSIS — R7881 Bacteremia: Secondary | ICD-10-CM

## 2017-06-15 DIAGNOSIS — R140 Abdominal distension (gaseous): Secondary | ICD-10-CM

## 2017-06-15 DIAGNOSIS — J189 Pneumonia, unspecified organism: Secondary | ICD-10-CM

## 2017-06-15 DIAGNOSIS — R509 Fever, unspecified: Secondary | ICD-10-CM

## 2017-06-15 DIAGNOSIS — S82251C Displaced comminuted fracture of shaft of right tibia, initial encounter for open fracture type IIIA, IIIB, or IIIC: Secondary | ICD-10-CM | POA: Diagnosis not present

## 2017-06-15 DIAGNOSIS — M79661 Pain in right lower leg: Secondary | ICD-10-CM | POA: Diagnosis not present

## 2017-06-15 HISTORY — PX: ORIF ANKLE FRACTURE: SHX5408

## 2017-06-15 LAB — COMPREHENSIVE METABOLIC PANEL
ALBUMIN: 2.7 g/dL — AB (ref 3.5–5.0)
ALK PHOS: 228 U/L — AB (ref 38–126)
ALT: 66 U/L — ABNORMAL HIGH (ref 14–54)
ANION GAP: 9 (ref 5–15)
AST: 74 U/L — ABNORMAL HIGH (ref 15–41)
BILIRUBIN TOTAL: 2.1 mg/dL — AB (ref 0.3–1.2)
BUN: 28 mg/dL — ABNORMAL HIGH (ref 6–20)
CALCIUM: 8.5 mg/dL — AB (ref 8.9–10.3)
CO2: 20 mmol/L — ABNORMAL LOW (ref 22–32)
Chloride: 112 mmol/L — ABNORMAL HIGH (ref 101–111)
Creatinine, Ser: 0.52 mg/dL (ref 0.44–1.00)
Glucose, Bld: 171 mg/dL — ABNORMAL HIGH (ref 65–99)
POTASSIUM: 4.1 mmol/L (ref 3.5–5.1)
Sodium: 141 mmol/L (ref 135–145)
TOTAL PROTEIN: 6.9 g/dL (ref 6.5–8.1)

## 2017-06-15 LAB — OSMOLALITY, URINE: Osmolality, Ur: 466 mOsm/kg (ref 300–900)

## 2017-06-15 LAB — URINALYSIS, ROUTINE W REFLEX MICROSCOPIC
BILIRUBIN URINE: NEGATIVE
GLUCOSE, UA: NEGATIVE mg/dL
Ketones, ur: NEGATIVE mg/dL
LEUKOCYTES UA: NEGATIVE
Nitrite: NEGATIVE
PH: 6 (ref 5.0–8.0)
Protein, ur: 30 mg/dL — AB
SPECIFIC GRAVITY, URINE: 1.012 (ref 1.005–1.030)
SQUAMOUS EPITHELIAL / LPF: NONE SEEN

## 2017-06-15 LAB — BLOOD GAS, ARTERIAL
ACID-BASE DEFICIT: 2.3 mmol/L — AB (ref 0.0–2.0)
BICARBONATE: 22 mmol/L (ref 20.0–28.0)
Drawn by: 44135
FIO2: 40
LHR: 15 {breaths}/min
O2 Saturation: 98.8 %
PATIENT TEMPERATURE: 98.6
PCO2 ART: 37.6 mmHg (ref 32.0–48.0)
PEEP/CPAP: 5 cmH2O
PO2 ART: 150 mmHg — AB (ref 83.0–108.0)
PRESSURE CONTROL: 12 cmH2O
pH, Arterial: 7.385 (ref 7.350–7.450)

## 2017-06-15 LAB — CBC
HCT: 22.1 % — ABNORMAL LOW (ref 36.0–46.0)
HEMATOCRIT: 27.8 % — AB (ref 36.0–46.0)
HEMOGLOBIN: 9.2 g/dL — AB (ref 12.0–15.0)
Hemoglobin: 7.1 g/dL — ABNORMAL LOW (ref 12.0–15.0)
MCH: 28.8 pg (ref 26.0–34.0)
MCH: 28.1 pg (ref 26.0–34.0)
MCHC: 33.1 g/dL (ref 30.0–36.0)
MCHC: 32.1 g/dL (ref 30.0–36.0)
MCV: 86.9 fL (ref 78.0–100.0)
MCV: 87.4 fL (ref 78.0–100.0)
Platelets: 520 10*3/uL — ABNORMAL HIGH (ref 150–400)
Platelets: 484 10*3/uL — ABNORMAL HIGH (ref 150–400)
RBC: 3.2 MIL/uL — AB (ref 3.87–5.11)
RBC: 2.53 MIL/uL — AB (ref 3.87–5.11)
RDW: 15.1 % (ref 11.5–15.5)
RDW: 15.2 % (ref 11.5–15.5)
WBC: 22.7 10*3/uL — AB (ref 4.0–10.5)
WBC: 13.3 10*3/uL — AB (ref 4.0–10.5)

## 2017-06-15 LAB — PHOSPHORUS: PHOSPHORUS: 5.1 mg/dL — AB (ref 2.5–4.6)

## 2017-06-15 LAB — SODIUM, URINE, RANDOM: SODIUM UR: 163 mmol/L

## 2017-06-15 LAB — GLUCOSE, CAPILLARY
GLUCOSE-CAPILLARY: 140 mg/dL — AB (ref 65–99)
Glucose-Capillary: 101 mg/dL — ABNORMAL HIGH (ref 65–99)
Glucose-Capillary: 115 mg/dL — ABNORMAL HIGH (ref 65–99)
Glucose-Capillary: 120 mg/dL — ABNORMAL HIGH (ref 65–99)
Glucose-Capillary: 132 mg/dL — ABNORMAL HIGH (ref 65–99)
Glucose-Capillary: 140 mg/dL — ABNORMAL HIGH (ref 65–99)

## 2017-06-15 LAB — MAGNESIUM: Magnesium: 2.4 mg/dL (ref 1.7–2.4)

## 2017-06-15 LAB — OSMOLALITY: Osmolality: 309 mOsm/kg — ABNORMAL HIGH (ref 275–295)

## 2017-06-15 LAB — LACTIC ACID, PLASMA: LACTIC ACID, VENOUS: 1.7 mmol/L (ref 0.5–1.9)

## 2017-06-15 SURGERY — OPEN REDUCTION INTERNAL FIXATION (ORIF) ANKLE FRACTURE
Anesthesia: General | Site: Ankle | Laterality: Right

## 2017-06-15 MED ORDER — VANCOMYCIN HCL 1000 MG IV SOLR
INTRAVENOUS | Status: DC | PRN
  Administered 2017-06-15: 1000 mg via TOPICAL

## 2017-06-15 MED ORDER — PROMETHAZINE HCL 25 MG/ML IJ SOLN
6.2500 mg | INTRAMUSCULAR | Status: DC | PRN
  Administered 2017-06-18: 12.5 mg via INTRAVENOUS
  Filled 2017-06-15: qty 1

## 2017-06-15 MED ORDER — MIDAZOLAM HCL 2 MG/2ML IJ SOLN
INTRAMUSCULAR | Status: AC
  Filled 2017-06-15: qty 2

## 2017-06-15 MED ORDER — CHLORHEXIDINE GLUCONATE 0.12 % MT SOLN
OROMUCOSAL | Status: AC
  Filled 2017-06-15: qty 15

## 2017-06-15 MED ORDER — ALBUMIN HUMAN 5 % IV SOLN
INTRAVENOUS | Status: AC
  Filled 2017-06-15: qty 500

## 2017-06-15 MED ORDER — MEPERIDINE HCL 25 MG/ML IJ SOLN: 6.2500 mg | INTRAMUSCULAR | Status: DC | PRN

## 2017-06-15 MED ORDER — TOBRAMYCIN SULFATE 80 MG/2ML IJ SOLN
INTRAMUSCULAR | Status: AC
  Filled 2017-06-15: qty 2

## 2017-06-15 MED ORDER — ROCURONIUM BROMIDE 100 MG/10ML IV SOLN
INTRAVENOUS | Status: DC | PRN
  Administered 2017-06-15: 10 mg via INTRAVENOUS
  Administered 2017-06-15: 20 mg via INTRAVENOUS
  Administered 2017-06-15: 30 mg via INTRAVENOUS

## 2017-06-15 MED ORDER — ROCURONIUM BROMIDE 100 MG/10ML IV SOLN: INTRAVENOUS | Status: DC | PRN

## 2017-06-15 MED ORDER — LACTATED RINGERS IV BOLUS (SEPSIS)
1000.0000 mL | Freq: Once | INTRAVENOUS | Status: AC
  Administered 2017-06-15: 1000 mL via INTRAVENOUS

## 2017-06-15 MED ORDER — TOBRAMYCIN SULFATE 1.2 G IJ SOLR
INTRAMUSCULAR | Status: AC
  Filled 2017-06-15: qty 1.2

## 2017-06-15 MED ORDER — LACTATED RINGERS IV SOLN
INTRAVENOUS | Status: DC | PRN
  Administered 2017-06-15 (×2): via INTRAVENOUS

## 2017-06-15 MED ORDER — PROPOFOL 10 MG/ML IV BOLUS
INTRAVENOUS | Status: AC
  Filled 2017-06-15: qty 20

## 2017-06-15 MED ORDER — HYDROMORPHONE HCL 1 MG/ML IJ SOLN
0.2500 mg | INTRAMUSCULAR | Status: DC | PRN
  Administered 2017-06-21: 0.5 mg via INTRAVENOUS
  Filled 2017-06-15: qty 0.5

## 2017-06-15 MED ORDER — VASOPRESSIN 20 UNIT/ML IV SOLN
0.0300 [IU]/min | INTRAVENOUS | Status: DC
  Administered 2017-06-15 – 2017-06-16 (×2): 0.03 [IU]/min via INTRAVENOUS
  Filled 2017-06-15 (×2): qty 2

## 2017-06-15 MED ORDER — DEXMEDETOMIDINE HCL IN NACL 200 MCG/50ML IV SOLN
INTRAVENOUS | Status: DC | PRN
  Administered 2017-06-15: 0.7 ug/kg/h via INTRAVENOUS

## 2017-06-15 MED ORDER — VANCOMYCIN HCL 1000 MG IV SOLR
INTRAVENOUS | Status: AC
  Filled 2017-06-15: qty 1000

## 2017-06-15 MED ORDER — TOBRAMYCIN SULFATE 1.2 G IJ SOLR
INTRAMUSCULAR | Status: DC | PRN
  Administered 2017-06-15: 1.2 g via TOPICAL

## 2017-06-15 MED ORDER — LACTATED RINGERS IV SOLN: INTRAVENOUS | Status: DC

## 2017-06-15 MED ORDER — 0.9 % SODIUM CHLORIDE (POUR BTL) OPTIME
TOPICAL | Status: DC | PRN
  Administered 2017-06-15: 1000 mL

## 2017-06-15 MED ORDER — ALBUMIN HUMAN 5 % IV SOLN
INTRAVENOUS | Status: DC | PRN
  Administered 2017-06-15: 13:00:00 via INTRAVENOUS

## 2017-06-15 MED ORDER — ALBUMIN HUMAN 5 % IV SOLN
50.0000 g | Freq: Once | INTRAVENOUS | Status: AC
  Administered 2017-06-15: 50 g via INTRAVENOUS
  Filled 2017-06-15: qty 500

## 2017-06-15 SURGICAL SUPPLY — 77 items
BANDAGE ELASTIC 4 VELCRO ST LF (GAUZE/BANDAGES/DRESSINGS) ×3 IMPLANT
BANDAGE ESMARK 6X9 LF (GAUZE/BANDAGES/DRESSINGS) ×1 IMPLANT
BIT DRILL 2.5X110 QC LCP DISP (BIT) ×3 IMPLANT
BIT DRILL LCP QC 2X140 (BIT) ×6 IMPLANT
BIT DRILL QC 3.5X110 (BIT) ×3 IMPLANT
BNDG COHESIVE 4X5 TAN STRL (GAUZE/BANDAGES/DRESSINGS) ×3 IMPLANT
BNDG ELASTIC 6X10 VLCR STRL LF (GAUZE/BANDAGES/DRESSINGS) ×3 IMPLANT
BNDG ESMARK 6X9 LF (GAUZE/BANDAGES/DRESSINGS) ×3
BRUSH SCRUB SURG 4.25 DISP (MISCELLANEOUS) ×3 IMPLANT
CHLORAPREP W/TINT 26ML (MISCELLANEOUS) ×6 IMPLANT
COVER MAYO STAND STRL (DRAPES) ×3 IMPLANT
COVER SURGICAL LIGHT HANDLE (MISCELLANEOUS) ×3 IMPLANT
DRAPE C-ARM 42X72 X-RAY (DRAPES) ×3 IMPLANT
DRAPE C-ARMOR (DRAPES) ×3 IMPLANT
DRAPE HALF SHEET 40X57 (DRAPES) ×3 IMPLANT
DRAPE ORTHO SPLIT 77X108 STRL (DRAPES) ×4
DRAPE SURG ORHT 6 SPLT 77X108 (DRAPES) ×2 IMPLANT
DRAPE U-SHAPE 47X51 STRL (DRAPES) ×3 IMPLANT
DRSG ADAPTIC 3X8 NADH LF (GAUZE/BANDAGES/DRESSINGS) ×3 IMPLANT
DRSG VAC ATS MED SENSATRAC (GAUZE/BANDAGES/DRESSINGS) ×3 IMPLANT
ELECT REM PT RETURN 9FT ADLT (ELECTROSURGICAL) ×3
ELECTRODE REM PT RTRN 9FT ADLT (ELECTROSURGICAL) ×1 IMPLANT
GAUZE SPONGE 4X4 12PLY STRL (GAUZE/BANDAGES/DRESSINGS) ×3 IMPLANT
GLOVE BIO SURGEON STRL SZ7.5 (GLOVE) ×12 IMPLANT
GLOVE BIOGEL PI IND STRL 6.5 (GLOVE) ×1 IMPLANT
GLOVE BIOGEL PI IND STRL 7.0 (GLOVE) ×1 IMPLANT
GLOVE BIOGEL PI IND STRL 7.5 (GLOVE) ×1 IMPLANT
GLOVE BIOGEL PI INDICATOR 6.5 (GLOVE) ×2
GLOVE BIOGEL PI INDICATOR 7.0 (GLOVE) ×2
GLOVE BIOGEL PI INDICATOR 7.5 (GLOVE) ×2
GLOVE SURG SS PI 7.0 STRL IVOR (GLOVE) ×3 IMPLANT
GLOVE SURG SS PI 7.5 STRL IVOR (GLOVE) ×3 IMPLANT
GOWN STRL REUS W/ TWL LRG LVL3 (GOWN DISPOSABLE) ×2 IMPLANT
GOWN STRL REUS W/TWL LRG LVL3 (GOWN DISPOSABLE) ×4
KIT ROOM TURNOVER OR (KITS) ×3 IMPLANT
MANIFOLD NEPTUNE II (INSTRUMENTS) ×3 IMPLANT
NS IRRIG 1000ML POUR BTL (IV SOLUTION) ×3 IMPLANT
PACK TOTAL JOINT (CUSTOM PROCEDURE TRAY) ×3 IMPLANT
PAD ABD 8X10 STRL (GAUZE/BANDAGES/DRESSINGS) ×12 IMPLANT
PAD ARMBOARD 7.5X6 YLW CONV (MISCELLANEOUS) ×6 IMPLANT
PAD CAST 4YDX4 CTTN HI CHSV (CAST SUPPLIES) ×1 IMPLANT
PADDING CAST COTTON 4X4 STRL (CAST SUPPLIES) ×2
PADDING CAST COTTON 6X4 STRL (CAST SUPPLIES) ×3 IMPLANT
PLATE 2.7 LCP L RT 2H HEAD (Plate) ×3 IMPLANT
PLATE 2.7 LCP T 2HOLES HEAD (Plate) ×3 IMPLANT
PLATE FIBULA 5 HOLE RIGHT (Plate) ×3 IMPLANT
SCREW 2.7X36MM (Screw) ×4 IMPLANT
SCREW BONE CORTEX 3.5 46MM (Screw) ×3 IMPLANT
SCREW CORTEX 2.7X14 (Screw) ×3 IMPLANT
SCREW CORTEX 2.7X36 ST T8 (Screw) ×2 IMPLANT
SCREW CORTICAL 2.7X38MM BONE (Screw) ×3 IMPLANT
SCREW LOCK ST 2.7X12 (Screw) ×4 IMPLANT
SCREW LOCK T8 12X2.7X2.1XST (Screw) ×2 IMPLANT
SCREW LOCK VA ST 2.7X14 (Screw) ×3 IMPLANT
SCREW LOCK VA ST 2.7X18 (Screw) ×6 IMPLANT
SCREW LOCKING 2.7X16MM VA (Screw) ×6 IMPLANT
SCREW LOCKING 2.7X34MM (Screw) ×3 IMPLANT
SCREW LOCKING 2.7X36MM (Screw) ×3 IMPLANT
SCREW METAPHYSEAL 2.7X20MM (Screw) ×3 IMPLANT
SCREW SELF TAP 12M (Screw) ×3 IMPLANT
SCREW SELF TAP 14MM (Screw) ×9 IMPLANT
SPONGE LAP 18X18 X RAY DECT (DISPOSABLE) ×3 IMPLANT
STAPLER VISISTAT 35W (STAPLE) ×3 IMPLANT
SUCTION FRAZIER HANDLE 10FR (MISCELLANEOUS) ×2
SUCTION FRAZIER TIP 8 FR DISP (SUCTIONS) ×2
SUCTION TUBE FRAZIER 10FR DISP (MISCELLANEOUS) ×1 IMPLANT
SUCTION TUBE FRAZIER 8FR DISP (SUCTIONS) ×1 IMPLANT
SUT ETHILON 3 0 PS 1 (SUTURE) ×12 IMPLANT
SUT MON AB 2-0 CT1 36 (SUTURE) ×6 IMPLANT
SUT VIC AB 2-0 CT1 27 (SUTURE) ×4
SUT VIC AB 2-0 CT1 TAPERPNT 27 (SUTURE) ×2 IMPLANT
TOWEL OR 17X24 6PK STRL BLUE (TOWEL DISPOSABLE) ×3 IMPLANT
TOWEL OR 17X26 10 PK STRL BLUE (TOWEL DISPOSABLE) ×6 IMPLANT
TUBE CONNECTING 12'X1/4 (SUCTIONS) ×1
TUBE CONNECTING 12X1/4 (SUCTIONS) ×2 IMPLANT
UNDERPAD 30X30 (UNDERPADS AND DIAPERS) ×3 IMPLANT
WND VAC CANISTER 500ML (MISCELLANEOUS) ×3 IMPLANT

## 2017-06-15 NOTE — Progress Notes (Signed)
Follow up - Trauma and Critical Care  Patient Details:    Briana [name] is an 22 y.o. female.  Lines/tubes : Airway 7 mm (Active)  Secured at (cm) 23 cm 06/15/2017  7:55 AM  Measured From Lips 06/15/2017  7:55 AM  Secured Location Left 06/15/2017  7:55 AM  Secured By [employer] 06/15/2017  7:55 AM  Tube Holder Repositioned Yes 06/15/2017  7:55 AM  Cuff Pressure (cm H2O) 28 cm H2O 06/15/2017  7:55 AM  Site Condition Dry 06/15/2017  7:55 AM     CVC Triple Lumen 05/31/17 Right Subclavian (Active)  Indication for Insertion or Continuance of Line Vasoactive infusions;Prolonged intravenous therapies 06/14/2017  8:00 PM  Site Assessment Clean;Dry;Intact 06/14/2017  8:00 PM  Proximal Lumen Status Infusing 06/14/2017  8:00 PM  Medial Lumen Status Infusing 06/14/2017  8:00 PM  Distal Lumen Status In-line blood sampling system in place 06/14/2017  8:00 PM  Dressing Type Transparent;Occlusive 06/14/2017  8:00 PM  Dressing Status Clean;Dry;Intact;Antimicrobial disc in place 06/14/2017  8:00 PM  Line Care Connections checked and tightened 06/14/2017  8:00 PM  Dressing Intervention Dressing changed 06/14/2017  4:00 PM  Dressing Change Due 06/21/17 06/14/2017  4:00 PM     Negative Pressure Wound Therapy Leg Anterior;Lower;Right (Active)  Site / Wound Assessment Dressing in place / Unable to assess 06/14/2017  8:00 PM  Peri-wound Assessment Intact 06/14/2017  8:00 PM  Cycle Continuous 06/14/2017  8:00 PM  Target Pressure (mmHg) 125 06/14/2017  8:00 PM  Canister Changed No 06/14/2017  8:00 AM  Dressing Status Intact 06/14/2017  8:00 PM  Drainage Amount Minimal 06/14/2017  8:00 PM  Drainage Description Serosanguineous 06/11/2017  8:00 PM  Output (mL) 25 mL 06/14/2017  7:00 PM     NG/OG Tube Orogastric Center mouth (Active)  External Length of Tube (cm) - (if applicable) 67 cm 06/14/2017  8:00 AM  Site Assessment Clean;Dry;Intact 06/14/2017  8:00 PM  Ongoing Placement Verification No acute changes, not  attributed to clinical condition;No change in respiratory status 06/14/2017  8:00 PM  Status Infusing tube feed;Irrigated 06/14/2017  8:00 PM  Drainage Appearance Bile 06/05/2017  8:00 PM  Intake (mL) 50 mL 06/14/2017  8:00 PM  Output (mL) 140 mL 06/02/2017  6:00 PM     Urethral Catheter [employer] RN Temperature probe 14 Fr. (Active)  Indication for Insertion or Continuance of Catheter Other (comment) 06/14/2017  8:00 PM  Catheter Maintenance Bag below level of bladder;Catheter secured;Drainage bag/tubing not touching floor;Insertion date on drainage bag;No dependent loops;Seal intact 06/14/2017  8:00 PM  Collection Container Standard drainage bag 06/14/2017  8:00 PM  Securement Method Securing device (Describe) 06/14/2017  8:00 PM  Output (mL) 800 mL 06/15/2017  6:00 AM    Microbiology/Sepsis markers: Results for orders placed or performed during the hospital encounter of 05/30/17  MRSA PCR Screening     Status: None   Collection Time: 05/30/17  1:58 PM  Result Value Ref Range Status   MRSA by PCR NEGATIVE NEGATIVE Final    Comment:        The GeneXpert MRSA Assay (FDA approved for NASAL specimens only), is one component of a comprehensive MRSA colonization surveillance program. It is not intended to diagnose MRSA infection nor to guide or monitor treatment for MRSA infections.   Culture, respiratory (NON-Expectorated)     Status: None   Collection Time: 06/05/17  8:19 AM  Result Value Ref Range Status   Specimen Description TRACHEAL ASPIRATE  Final  Special Requests NONE  Final   Gram Stain   Final    ABUNDANT WBC PRESENT, PREDOMINANTLY PMN MODERATE GRAM POSITIVE COCCI IN PAIRS FEW GRAM NEGATIVE RODS FEW GRAM POSITIVE RODS FEW GRAM NEGATIVE DIPLOCOCCI    Culture FEW Consistent with normal respiratory flora.  Final   Report Status 06/07/2017 FINAL  Final  Culture, blood (Routine X 2) w Reflex to ID Panel     Status: None   Collection Time: 06/07/17 11:00 AM  Result Value Ref  Range Status   Specimen Description BLOOD RIGHT HAND  Final   Special Requests   Final    BOTTLES DRAWN AEROBIC ONLY Blood Culture results may not be optimal due to an inadequate volume of blood received in culture bottles   Culture NO GROWTH 5 DAYS  Final   Report Status 06/12/2017 FINAL  Final  Culture, blood (Routine X 2) w Reflex to ID Panel     Status: None   Collection Time: 06/07/17  4:02 PM  Result Value Ref Range Status   Specimen Description BLOOD LEFT HAND  Final   Special Requests IN PEDIATRIC BOTTLE Blood Culture adequate volume  Final   Culture NO GROWTH 5 DAYS  Final   Report Status 06/12/2017 FINAL  Final  Culture, blood (routine x 2)     Status: Abnormal   Collection Time: 06/09/17  1:20 PM  Result Value Ref Range Status   Specimen Description BLOOD RIGHT HAND  Final   Special Requests IN PEDIATRIC BOTTLE Blood Culture adequate volume  Final   Culture  Setup Time   Final    GRAM NEGATIVE RODS IN PEDIATRIC BOTTLE CRITICAL RESULT CALLED TO, READ BACK BY AND VERIFIED WITH: A MASTERS PHARMD 0415 06/10/17 A BROWNING    Culture SERRATIA MARCESCENS (A)  Final   Report Status 06/12/2017 FINAL  Final   Organism ID, Bacteria SERRATIA MARCESCENS  Final      Susceptibility   Serratia marcescens - MIC*    CEFAZOLIN >=64 RESISTANT Resistant     CEFEPIME <=1 SENSITIVE Sensitive     CEFTAZIDIME <=1 SENSITIVE Sensitive     CEFTRIAXONE <=1 SENSITIVE Sensitive     CIPROFLOXACIN <=0.25 SENSITIVE Sensitive     GENTAMICIN <=1 SENSITIVE Sensitive     TRIMETH/SULFA <=20 SENSITIVE Sensitive     * SERRATIA MARCESCENS  Blood Culture ID Panel (Reflexed)     Status: Abnormal   Collection Time: 06/09/17  1:20 PM  Result Value Ref Range Status   Enterococcus species NOT DETECTED NOT DETECTED Final   Listeria monocytogenes NOT DETECTED NOT DETECTED Final   Staphylococcus species NOT DETECTED NOT DETECTED Final   Staphylococcus aureus NOT DETECTED NOT DETECTED Final   Streptococcus species  NOT DETECTED NOT DETECTED Final   Streptococcus agalactiae NOT DETECTED NOT DETECTED Final   Streptococcus pneumoniae NOT DETECTED NOT DETECTED Final   Streptococcus pyogenes NOT DETECTED NOT DETECTED Final   Acinetobacter baumannii NOT DETECTED NOT DETECTED Final   Enterobacteriaceae species DETECTED (A) NOT DETECTED Final    Comment: Enterobacteriaceae represent a large family of gram-negative bacteria, not a single organism. CRITICAL RESULT CALLED TO, READ BACK BY AND VERIFIED WITH: A MASTERS PHARMD 0415 06/10/17 A BROWNING    Enterobacter cloacae complex NOT DETECTED NOT DETECTED Final   Escherichia coli NOT DETECTED NOT DETECTED Final   Klebsiella oxytoca NOT DETECTED NOT DETECTED Final   Klebsiella pneumoniae NOT DETECTED NOT DETECTED Final   Proteus species NOT DETECTED NOT DETECTED Final   Serratia  marcescens DETECTED (A) NOT DETECTED Final    Comment: CRITICAL RESULT CALLED TO, READ BACK BY AND VERIFIED WITH: A MASTERS PHARMD 0415 06/10/17 A BROWNING    Carbapenem resistance NOT DETECTED NOT DETECTED Final   Haemophilus influenzae NOT DETECTED NOT DETECTED Final   Neisseria meningitidis NOT DETECTED NOT DETECTED Final   Pseudomonas aeruginosa NOT DETECTED NOT DETECTED Final   Candida albicans NOT DETECTED NOT DETECTED Final   Candida glabrata NOT DETECTED NOT DETECTED Final   Candida krusei NOT DETECTED NOT DETECTED Final   Candida parapsilosis NOT DETECTED NOT DETECTED Final   Candida tropicalis NOT DETECTED NOT DETECTED Final  Culture, blood (routine x 2)     Status: Abnormal   Collection Time: 06/09/17  1:27 PM  Result Value Ref Range Status   Specimen Description BLOOD RIGHT HAND  Final   Special Requests IN PEDIATRIC BOTTLE Blood Culture adequate volume  Final   Culture  Setup Time   Final    GRAM NEGATIVE RODS IN PEDIATRIC BOTTLE CRITICAL RESULT CALLED TO, READ BACK BY AND VERIFIED WITH: A MASTERS PHARMD 0415 06/10/17 A BROWNING    Culture (A)  Final    SERRATIA  MARCESCENS SUSCEPTIBILITIES PERFORMED ON PREVIOUS CULTURE WITHIN THE LAST 5 DAYS.    Report Status 06/12/2017 FINAL  Final  Culture, Urine     Status: None   Collection Time: 06/09/17  2:22 PM  Result Value Ref Range Status   Specimen Description URINE, CATHETERIZED  Final   Special Requests Normal  Final   Culture NO GROWTH  Final   Report Status 06/10/2017 FINAL  Final  Culture, respiratory (NON-Expectorated)     Status: None   Collection Time: 06/09/17  2:36 PM  Result Value Ref Range Status   Specimen Description TRACHEAL ASPIRATE  Final   Special Requests Normal  Final   Gram Stain   Final    FEW WBC PRESENT, PREDOMINANTLY PMN RARE GRAM NEGATIVE RODS RARE GRAM POSITIVE COCCI IN CLUSTERS    Culture   Final    MODERATE PSEUDOMONAS AERUGINOSA MODERATE STENOTROPHOMONAS MALTOPHILIA    Report Status 06/13/2017 FINAL  Final   Organism ID, Bacteria PSEUDOMONAS AERUGINOSA  Final   Organism ID, Bacteria STENOTROPHOMONAS MALTOPHILIA  Final      Susceptibility   Pseudomonas aeruginosa - MIC*    CEFTAZIDIME 4 SENSITIVE Sensitive     CIPROFLOXACIN <=0.25 SENSITIVE Sensitive     GENTAMICIN <=1 SENSITIVE Sensitive     IMIPENEM <=0.25 SENSITIVE Sensitive     PIP/TAZO 8 SENSITIVE Sensitive     CEFEPIME 2 SENSITIVE Sensitive     * MODERATE PSEUDOMONAS AERUGINOSA   Stenotrophomonas maltophilia - MIC*    LEVOFLOXACIN 1 SENSITIVE Sensitive     TRIMETH/SULFA <=20 SENSITIVE Sensitive     * MODERATE STENOTROPHOMONAS MALTOPHILIA    Anti-infectives:  Anti-infectives (From admission, onward)   Start     Dose/Rate Route Frequency Ordered Stop   06/12/17 1530  ceFEPIme (MAXIPIME) 1 g in dextrose 5 % 50 mL IVPB     1 g 100 mL/hr over 30 Minutes Intravenous Every 8 hours 06/12/17 1444     06/12/17 1400  levofloxacin (LEVAQUIN) IVPB 750 mg     750 mg 100 mL/hr over 90 Minutes Intravenous Every 24 hours 06/12/17 1315     06/10/17 1200  cefTRIAXone (ROCEPHIN) 2 g in dextrose 5 % 50 mL IVPB   Status:  Discontinued     2 g 100 mL/hr over 30 Minutes Intravenous Every 24  hours 06/10/17 1135 06/12/17 1437   06/10/17 0500  gentamicin (GARAMYCIN) 400 mg in dextrose 5 % 100 mL IVPB     400 mg 110 mL/hr over 60 Minutes Intravenous  Once 06/10/17 0436 06/10/17 0550   06/08/17 1243  vancomycin (VANCOCIN) powder  Status:  Discontinued       As needed 06/08/17 1243 06/08/17 1349   06/07/17 1000  vancomycin (VANCOCIN) 1,250 mg in sodium chloride 0.9 % 250 mL IVPB  Status:  Discontinued     1,250 mg 166.7 mL/hr over 90 Minutes Intravenous Every 8 hours 06/07/17 0804 06/12/17 1307   06/05/17 1700  vancomycin (VANCOCIN) 1,250 mg in sodium chloride 0.9 % 250 mL IVPB  Status:  Discontinued     1,250 mg 166.7 mL/hr over 90 Minutes Intravenous Every 8 hours 06/05/17 1625 06/07/17 0746   06/05/17 0830  piperacillin-tazobactam (ZOSYN) IVPB 3.375 g  Status:  Discontinued     3.375 g 12.5 mL/hr over 240 Minutes Intravenous Every 8 hours 06/05/17 0803 06/10/17 0933   06/03/17 1600  vancomycin (VANCOCIN) IVPB 1000 mg/200 mL premix  Status:  Discontinued     1,000 mg 200 mL/hr over 60 Minutes Intravenous Every 12 hours 06/03/17 1417 06/05/17 1625   06/03/17 1226  tobramycin (NEBCIN) powder  Status:  Discontinued       As needed 06/03/17 1227 06/03/17 1326   06/03/17 1225  vancomycin (VANCOCIN) powder  Status:  Discontinued       As needed 06/03/17 1226 06/03/17 1326   05/30/17 0930  ceFAZolin (ANCEF) IVPB 1 g/50 mL premix  Status:  Discontinued     1 g 100 mL/hr over 30 Minutes Intravenous Every 8 hours 05/30/17 0917 06/03/17 1417   05/30/17 0600  ceFAZolin (ANCEF) IVPB 1 g/50 mL premix     1 g 100 mL/hr over 30 Minutes Intravenous  Once 05/30/17 0556 05/30/17 0742      Best Practice/Protocols:  VTE Prophylaxis: Lovenox (prophylaxtic dose) and Mechanical GI Prophylaxis: Proton Pump Inhibitor Continous Sedation Patient is also on two pressors of vasopressing and Neo  Consults: Treatment Team:   Md, Trauma, MD Haddix, Gillie MannersKevin P, MD Ditty, Loura HaltBenjamin Jared, MD    Events:  Subjective:    Overnight Issues: Nurse not at the bedside currently, no report of additional problems.  Dr. Jena GaussHaddix to take the patient ot the OR today.  Objective:  Vital signs for last 24 hours: Temp:  [97.5 F (36.4 C)-100.9 F (38.3 C)] 99.7 F (37.6 C) (01/14 0650) Pulse Rate:  [82-150] 85 (01/14 0650) Resp:  [10-22] 15 (01/14 0650) BP: (62-139)/(29-93) 104/56 (01/14 0650) SpO2:  [98 %-100 %] 100 % (01/14 0755) FiO2 (%):  [40 %] 40 % (01/14 0755) Weight:  [54 kg (119 lb 0.8 oz)] 54 kg (119 lb 0.8 oz) (01/14 0500)  Hemodynamic parameters for last 24 hours: CVP:  [6 mmHg-12 mmHg] 10 mmHg  Intake/Output from previous day: 01/13 0701 - 01/14 0700 In: 6204.6 [I.V.:4004.6; NG/GT:900; IV Piggyback:1300] Out: 8025 [Urine:8000; Drains:25]  Intake/Output this shift: No intake/output data recorded.  Vent settings for last 24 hours: Vent Mode: PCV FiO2 (%):  [40 %] 40 % Set Rate:  [15 bmp-20 bmp] 15 bmp Vt Set:  [12 mL] 12 mL PEEP:  [5 cmH20] 5 cmH20 Plateau Pressure:  [10 cmH20-18 cmH20] 12 cmH20  Physical Exam:  General: no respiratory distress and sedated but will open eyes to stimulus Neuro: nonfocal exam and RASS -1 Resp: clear to auscultation bilaterally and CXR is  clear CVS: regular rate and rhythm, S1, S2 normal, no murmur, click, rub or gallop GI: distended and soft with hypoactive bowel soounds. Skin: no rash Extremities: no edema, no erythema, pulses WNL and Going back to surgery today with Dr. Jena Gauss  Results for orders placed or performed during the hospital encounter of 05/30/17 (from the past 24 hour(s))  Glucose, capillary     Status: Abnormal   Collection Time: 06/14/17 11:47 AM  Result Value Ref Range   Glucose-Capillary 112 (H) 65 - 99 mg/dL  Glucose, capillary     Status: Abnormal   Collection Time: 06/14/17  4:03 PM  Result Value Ref Range   Glucose-Capillary 118 (H) 65  - 99 mg/dL  Glucose, capillary     Status: Abnormal   Collection Time: 06/14/17  7:32 PM  Result Value Ref Range   Glucose-Capillary 106 (H) 65 - 99 mg/dL  Glucose, capillary     Status: Abnormal   Collection Time: 06/15/17 12:27 AM  Result Value Ref Range   Glucose-Capillary 115 (H) 65 - 99 mg/dL  Comprehensive metabolic panel     Status: Abnormal   Collection Time: 06/15/17  1:12 AM  Result Value Ref Range   Sodium 141 135 - 145 mmol/L   Potassium 4.1 3.5 - 5.1 mmol/L   Chloride 112 (H) 101 - 111 mmol/L   CO2 20 (L) 22 - 32 mmol/L   Glucose, Bld 171 (H) 65 - 99 mg/dL   BUN 28 (H) 6 - 20 mg/dL   Creatinine, Ser 8.11 0.44 - 1.00 mg/dL   Calcium 8.5 (L) 8.9 - 10.3 mg/dL   Total Protein 6.9 6.5 - 8.1 g/dL   Albumin 2.7 (L) 3.5 - 5.0 g/dL   AST 74 (H) 15 - 41 U/L   ALT 66 (H) 14 - 54 U/L   Alkaline Phosphatase 228 (H) 38 - 126 U/L   Total Bilirubin 2.1 (H) 0.3 - 1.2 mg/dL   GFR calc non Af Amer >60 >60 mL/min   GFR calc Af Amer >60 >60 mL/min   Anion gap 9 5 - 15  CBC     Status: Abnormal   Collection Time: 06/15/17  1:12 AM  Result Value Ref Range   WBC 22.7 (H) 4.0 - 10.5 K/uL   RBC 3.20 (L) 3.87 - 5.11 MIL/uL   Hemoglobin 9.2 (L) 12.0 - 15.0 g/dL   HCT 91.4 (L) 78.2 - 95.6 %   MCV 86.9 78.0 - 100.0 fL   MCH 28.8 26.0 - 34.0 pg   MCHC 33.1 30.0 - 36.0 g/dL   RDW 21.3 08.6 - 57.8 %   Platelets 520 (H) 150 - 400 K/uL  Magnesium     Status: None   Collection Time: 06/15/17  1:12 AM  Result Value Ref Range   Magnesium 2.4 1.7 - 2.4 mg/dL  Phosphorus     Status: Abnormal   Collection Time: 06/15/17  1:12 AM  Result Value Ref Range   Phosphorus 5.1 (H) 2.5 - 4.6 mg/dL  Lactic acid, plasma     Status: None   Collection Time: 06/15/17  1:12 AM  Result Value Ref Range   Lactic Acid, Venous 1.7 0.5 - 1.9 mmol/L  Osmolality     Status: Abnormal   Collection Time: 06/15/17  3:00 AM  Result Value Ref Range   Osmolality 309 (H) 275 - 295 mOsm/kg  Sodium, urine, random      Status: None   Collection Time: 06/15/17  3:21 AM  Result Value Ref Range   Sodium, Ur 163 mmol/L  Osmolality, urine     Status: None   Collection Time: 06/15/17  3:21 AM  Result Value Ref Range   Osmolality, Ur 466 300 - 900 mOsm/kg  Glucose, capillary     Status: Abnormal   Collection Time: 06/15/17  3:25 AM  Result Value Ref Range   Glucose-Capillary 132 (H) 65 - 99 mg/dL  Blood gas, arterial     Status: Abnormal   Collection Time: 06/15/17  4:10 AM  Result Value Ref Range   FIO2 40.00    Delivery systems VENTILATOR    Mode PRESSURE REGULATED VOLUME CONTROL    LHR 15 resp/min   Peep/cpap 5.0 cm H20   Pressure control 12 cm H20   pH, Arterial 7.385 7.350 - 7.450   pCO2 arterial 37.6 32.0 - 48.0 mmHg   pO2, Arterial 150 (H) 83.0 - 108.0 mmHg   Bicarbonate 22.0 20.0 - 28.0 mmol/L   Acid-base deficit 2.3 (H) 0.0 - 2.0 mmol/L   O2 Saturation 98.8 %   Patient temperature 98.6    Collection site LEFT RADIAL    Drawn by [phone]    Sample type ARTERIAL DRAW    Allens test (pass/fail) PASS PASS     Assessment/Plan:   NEURO  Altered Mental Status:  agitation, sedation and only mild agitation   Plan: CPM, will try to wean sedation and from the ventilator once all surgery has been done  PULM  Pulmonary infection being treated, but functionally she is doing fine.   Plan: CPM  CARDIO  No cardiac issues currently.   Plan: CPM.  Dilaudid drip seems to have made adifference with tachycardia  RENAL  BUN is up a bit, but creatinine is fine.  Urine output is great.  Had obstruction of her bladder with Foley in place.   Plan: Watch renal function closely, especially now that the patient is on multiple pressors.  GI  No known issues.  No specific GI problems.  Abdomen is still distended, but bladder scan is without residual.  Liquid bowel movement in bed   Plan: CPM  ID  Pneumonia (hospital acquired (not ventilator-associated) Stenotrophomonas and Pseudomonas) Bacteremia (gram  negative rod and Serratia and Enterobacter.)   Plan: Patient on appropriate antibiotics for the bacteria that are growing from her sputum, but here WBC is still elevated and increasing to 22K.  She needs to get her right subclavian line out, but currently she is so dependent on pressor and sedatives it would be impossible go go without some type of central line.  Will see if she can get a fresh line in the OR  HEME  Anemia acute blood loss anemia and anemia of critical illness)   Plan: No blood for now.  ENDO No specific issue   Plan: CPM  Global Issues  Hypotension and apparent sepsis requiring two pressors, although urine output has been good.  BUN is up and creatinine a slight bit, but I cannot identify definitive source of infection that is not being treated.  Her right subclavian line has been in place since 12/30, and was in place when she had + blood cultures.  CVP is only 4, so I am giving her an albumin bolus of 1.0 liter.    LOS: 16 days   Additional comments:I reviewed the patient's new clinical lab test results. cbc/bmet/abg and I reviewed the patients new imaging test results. cxr  Critical Care Total Time*: 45 Minutes  Jimmye Norman 06/15/2017  *Care during the described time interval was provided by me and/or other providers on the critical care team.  I have reviewed this patient's available data, including medical history, events of note, physical examination and test results as part of my evaluation.

## 2017-06-15 NOTE — Transfer of Care (Signed)
Immediate Anesthesia Transfer of Care Note  Patient: Briana [name]  Procedure(s) Performed: OPEN REDUCTION INTERNAL FIXATION (ORIF) RIGHT ANKLE FRACTURE (Right Ankle)  Patient Location: ICU  Anesthesia Type:General  Level of Consciousness: sedated and Patient remains intubated per anesthesia plan  Airway & Oxygen Therapy: Patient placed on Ventilator (see vital sign flow sheet for setting)  Post-op Assessment: Report given to RN and Post -op Vital signs reviewed and stable  Post vital signs: Reviewed and stable  Last Vitals:  Vitals:   06/15/17 1200 06/15/17 1258  BP: (!) 103/55 94/71  Pulse: (!) 106 (!) 110  Resp: 18 12  Temp: 37.6 C   SpO2: 100%     Last Pain:  Vitals:   06/15/17 0800  TempSrc: Core  PainSc:          Complications: No apparent anesthesia complications

## 2017-06-15 NOTE — Progress Notes (Signed)
Cervical MRI still not performed. Will await results for final recommendations. Continue Aspen collar for now.   

## 2017-06-15 NOTE — Progress Notes (Signed)
Patient returned from the OR and placed back on full ventilator support.

## 2017-06-15 NOTE — Anesthesia Procedure Notes (Signed)
Date/Time: 06/15/2017 1:12 PM Performed by: Gwenyth AllegraAdami, Jamelle Goldston, CRNA Pre-anesthesia Checklist: Patient identified, Emergency Drugs available, Suction available, Patient being monitored and Timeout performed Patient Re-evaluated:Patient Re-evaluated prior to induction Induction Type: Combination inhalational/ intravenous induction Placement Confirmation: breath sounds checked- equal and bilateral

## 2017-06-15 NOTE — Progress Notes (Signed)
Pharmacy Antibiotic Communication  Reviewed patient's cultures and discussed with Dr. Orvan Falconerampbell. All of the organisms that the patient has grown (serratia, stenotrophomonas, and pseudomonas) are susceptible to fluoroquinolones. The patient's pseudomonas isolate is highly susceptible to quinolone antibiotics,  so no need for double coverage. Based on this information, we will narrow the patient to levofloxacin alone to treat all of the isolated organisms.    Plan - Stop Cefepime - Continue levofloxacin 750 mg every 24 hours   Sharin MonsEmily Lyndsey Demos, PharmD, BCPS PGY2 Infectious Diseases Pharmacy Resident Pager: [phone]

## 2017-06-15 NOTE — Anesthesia Preprocedure Evaluation (Signed)
Anesthesia Evaluation  Patient identified by MRN, date of birth, ID band Patient awake    Reviewed: Allergy & Precautions, NPO status , Patient's Chart, lab work & pertinent test results  Airway Mallampati: Intubated       Dental   Pulmonary    Pulmonary exam normal        Cardiovascular Normal cardiovascular exam     Neuro/Psych    GI/Hepatic   Endo/Other    Renal/GU      Musculoskeletal   Abdominal   Peds  Hematology   Anesthesia Other Findings   Reproductive/Obstetrics                             Anesthesia Physical Anesthesia Plan  ASA: III  Anesthesia Plan: General   Post-op Pain Management:    Induction:   PONV Risk Score and Plan: 3 and Treatment may vary due to age or medical condition  Airway Management Planned: Oral ETT  Additional Equipment:   Intra-op Plan:   Post-operative Plan: Post-operative intubation/ventilation  Informed Consent: I have reviewed the patients History and Physical, chart, labs and discussed the procedure including the risks, benefits and alternatives for the proposed anesthesia with the patient or authorized representative who has indicated his/her understanding and acceptance.     Plan Discussed with: CRNA and Surgeon  Anesthesia Plan Comments:         Anesthesia Quick Evaluation

## 2017-06-15 NOTE — Progress Notes (Signed)
Pt taken off vent to travel to OR. Pt manually bagged to OR by RT. Pt's vitals remained stable throughout the trip.
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
## 2017-06-15 NOTE — Anesthesia Postprocedure Evaluation (Signed)
Anesthesia Post Note  Patient: Briana CellaErica Zavala  Procedure(s) Performed: OPEN REDUCTION INTERNAL FIXATION (ORIF) RIGHT ANKLE FRACTURE (Right Ankle)     Anesthesia Post Evaluation  Last Vitals:  Vitals:   06/15/17 1200 06/15/17 1258  BP: (!) 103/55 94/71  Pulse: (!) 106 (!) 110  Resp: 18 12  Temp: 37.6 C   SpO2: 100%     Last Pain:  Vitals:   06/15/17 0800  TempSrc: Core  PainSc:                  Lakesia Dahle
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
## 2017-06-16 ENCOUNTER — Inpatient Hospital Stay (HOSPITAL_COMMUNITY): Payer: No Typology Code available for payment source

## 2017-06-16 LAB — CBC WITH DIFFERENTIAL/PLATELET
Basophils Absolute: 0 10*3/uL (ref 0.0–0.1)
Basophils Relative: 0 %
Eosinophils Absolute: 0.1 10*3/uL (ref 0.0–0.7)
Eosinophils Relative: 1 %
HEMATOCRIT: 21 % — AB (ref 36.0–46.0)
HEMOGLOBIN: 6.7 g/dL — AB (ref 12.0–15.0)
LYMPHS PCT: 14 %
Lymphs Abs: 1.9 10*3/uL (ref 0.7–4.0)
MCH: 28.1 pg (ref 26.0–34.0)
MCHC: 32.4 g/dL (ref 30.0–36.0)
MCV: 86.8 fL (ref 78.0–100.0)
Monocytes Absolute: 1.5 10*3/uL — ABNORMAL HIGH (ref 0.1–1.0)
Monocytes Relative: 11 %
NEUTROS ABS: 10 10*3/uL — AB (ref 1.7–7.7)
Neutrophils Relative %: 74 %
Platelets: 482 10*3/uL — ABNORMAL HIGH (ref 150–400)
RBC: 2.42 MIL/uL — AB (ref 3.87–5.11)
RDW: 14.9 % (ref 11.5–15.5)
WBC: 13.5 10*3/uL — AB (ref 4.0–10.5)

## 2017-06-16 LAB — BASIC METABOLIC PANEL
ANION GAP: 11 (ref 5–15)
BUN: 12 mg/dL (ref 6–20)
CHLORIDE: 100 mmol/L — AB (ref 101–111)
CO2: 22 mmol/L (ref 22–32)
Calcium: 8.6 mg/dL — ABNORMAL LOW (ref 8.9–10.3)
Creatinine, Ser: <0.3 mg/dL — ABNORMAL LOW (ref 0.44–1.00)
GLUCOSE: 124 mg/dL — AB (ref 65–99)
POTASSIUM: 3.4 mmol/L — AB (ref 3.5–5.1)
Sodium: 133 mmol/L — ABNORMAL LOW (ref 135–145)

## 2017-06-16 LAB — POCT I-STAT 3, ART BLOOD GAS (G3+)
BICARBONATE: 24.5 mmol/L (ref 20.0–28.0)
O2 Saturation: 99 %
PO2 ART: 129 mmHg — AB (ref 83.0–108.0)
TCO2: 26 mmol/L (ref 22–32)
pCO2 arterial: 38.7 mmHg (ref 32.0–48.0)
pH, Arterial: 7.411 (ref 7.350–7.450)

## 2017-06-16 LAB — URINE CULTURE: Culture: NO GROWTH

## 2017-06-16 LAB — CBC
HCT: 30.8 % — ABNORMAL LOW (ref 36.0–46.0)
HEMOGLOBIN: 10.3 g/dL — AB (ref 12.0–15.0)
MCH: 28.9 pg (ref 26.0–34.0)
MCHC: 33.4 g/dL (ref 30.0–36.0)
MCV: 86.5 fL (ref 78.0–100.0)
Platelets: 498 10*3/uL — ABNORMAL HIGH (ref 150–400)
RBC: 3.56 MIL/uL — ABNORMAL LOW (ref 3.87–5.11)
RDW: 14.2 % (ref 11.5–15.5)
WBC: 16.5 10*3/uL — ABNORMAL HIGH (ref 4.0–10.5)

## 2017-06-16 LAB — GLUCOSE, CAPILLARY
GLUCOSE-CAPILLARY: 95 mg/dL (ref 65–99)
GLUCOSE-CAPILLARY: 129 mg/dL — AB (ref 65–99)
Glucose-Capillary: 115 mg/dL — ABNORMAL HIGH (ref 65–99)
Glucose-Capillary: 116 mg/dL — ABNORMAL HIGH (ref 65–99)
Glucose-Capillary: 121 mg/dL — ABNORMAL HIGH (ref 65–99)
Glucose-Capillary: 124 mg/dL — ABNORMAL HIGH (ref 65–99)
Glucose-Capillary: 129 mg/dL — ABNORMAL HIGH (ref 65–99)

## 2017-06-16 LAB — PREPARE RBC (CROSSMATCH)

## 2017-06-16 MED ORDER — ALBUMIN HUMAN 5 % IV SOLN
25.0000 g | Freq: Once | INTRAVENOUS | Status: AC
  Administered 2017-06-16: 25 g via INTRAVENOUS
  Filled 2017-06-16: qty 500

## 2017-06-16 MED ORDER — BUDESONIDE 0.5 MG/2ML IN SUSP
0.5000 mg | Freq: Two times a day (BID) | RESPIRATORY_TRACT | Status: DC
  Administered 2017-06-16 – 2017-06-24 (×16): 0.5 mg via RESPIRATORY_TRACT
  Filled 2017-06-16 (×16): qty 2

## 2017-06-16 MED ORDER — LACTATED RINGERS IV BOLUS (SEPSIS)
750.0000 mL | Freq: Once | INTRAVENOUS | Status: AC
  Administered 2017-06-16: 750 mL via INTRAVENOUS

## 2017-06-16 MED ORDER — MIDAZOLAM HCL 5 MG/5ML IJ SOLN
INTRAMUSCULAR | Status: DC | PRN
  Administered 2017-06-15: 2 mg via INTRAVENOUS

## 2017-06-16 MED ORDER — FUROSEMIDE 10 MG/ML IJ SOLN
20.0000 mg | Freq: Once | INTRAMUSCULAR | Status: AC
  Administered 2017-06-16: 20 mg via INTRAVENOUS
  Filled 2017-06-16: qty 2

## 2017-06-16 MED ORDER — POTASSIUM CHLORIDE 20 MEQ/15ML (10%) PO SOLN
40.0000 meq | Freq: Once | ORAL | Status: AC
  Administered 2017-06-16: 40 meq
  Filled 2017-06-16: qty 30

## 2017-06-16 MED ORDER — SODIUM CHLORIDE 0.9 % IV SOLN: Freq: Once | INTRAVENOUS | Status: DC

## 2017-06-16 MED ORDER — SODIUM CHLORIDE 0.9 % IV SOLN
Freq: Once | INTRAVENOUS | Status: AC
  Administered 2017-06-16: 06:00:00 via INTRAVENOUS

## 2017-06-16 NOTE — Progress Notes (Signed)
CRITICAL VALUE ALERT  Critical Value:  hgb 6.7  Date & Time Notied:  06/16/16 0322  Provider Notified: Dr. Luisa Hartornett  Orders Received/Actions taken: Type and Screen, Transfuse 2 units

## 2017-06-16 NOTE — Progress Notes (Signed)
Orthopedic Tech Progress Note Patient Details:  Briana [name] 10/16/1995 [id number]  Ortho Devices Type of Ortho Device: Prafo boot/shoe Ortho Device/Splint Location: lle Ortho Device/Splint Interventions: Application   Post Interventions Patient Tolerated: Well Instructions Provided: Care of device   Nikki DomCrawford, Tailyn Hantz 06/16/2017, 9:45 AM
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
## 2017-06-16 NOTE — Addendum Note (Signed)
Addendum  created 06/16/17 1239 by Gwenyth AllegraAdami, Lucille Witts, CRNA   Intraprocedure Meds edited

## 2017-06-16 NOTE — Progress Notes (Signed)
Patient ID: Briana [name], female   DOB: 09/05/1995, 22 y.o.   MRN: [id number]          [facility]    Date of Admission:  05/30/2017   Total days of antibiotics 18        Day 5 levofloxacin  [name] continues to have fever.  She is on therapy for healthcare associated pneumonia. Dr. Jena GaussHaddix' note indicates concern about infection around her right tibial fracture site.  I am also still concerned about her right subclavian central line that was placed shortly after admission.  I would suggest changing it to another site.  I will continue levofloxacin pending further observation and culture results.         Cliffton AstersJohn Solan Vosler, MD [facility] [phone] pager   [phone] 06/16/2017, 1:10 PM

## 2017-06-16 NOTE — Progress Notes (Signed)
Follow up - Trauma and Critical Care  Patient Details:    Briana [name] is an 22 y.o. female.  Lines/tubes : Airway 7 mm (Active)  Secured at (cm) 23 cm 06/16/2017  8:23 AM  Measured From Lips 06/16/2017  8:23 AM  Secured Location Right 06/16/2017  8:23 AM  Secured By [employer] Tube Holder 06/16/2017  8:23 AM  Tube Holder Repositioned Yes 06/16/2017  8:23 AM  Cuff Pressure (cm H2O) 22 cm H2O 06/16/2017  8:23 AM  Site Condition Dry 06/16/2017  3:35 AM     CVC Triple Lumen 05/31/17 Right Subclavian (Active)  Indication for Insertion or Continuance of Line Vasoactive infusions;Prolonged intravenous therapies 06/15/2017  8:00 PM  Site Assessment Clean;Dry;Intact 06/15/2017  8:00 PM  Proximal Lumen Status Infusing 06/15/2017  8:00 PM  Medial Lumen Status Infusing 06/15/2017  8:00 PM  Distal Lumen Status In-line blood sampling system in place 06/15/2017  8:00 PM  Dressing Type Transparent;Occlusive 06/15/2017  8:00 PM  Dressing Status Clean;Dry;Intact;Antimicrobial disc in place 06/15/2017  8:00 PM  Line Care Connections checked and tightened;Proximal tubing changed;Zeroed and calibrated;Leveled 06/15/2017  8:00 PM  Dressing Intervention Dressing changed 06/14/2017  4:00 PM  Dressing Change Due 06/21/17 06/15/2017  8:00 PM     Negative Pressure Wound Therapy Ankle Right (Active)  Output (mL) 25 mL 06/15/2017  6:11 PM     NG/OG Tube Orogastric Center mouth (Active)  External Length of Tube (cm) - (if applicable) 67 cm 06/15/2017  8:00 PM  Site Assessment Clean;Dry;Intact 06/15/2017  8:00 PM  Ongoing Placement Verification No acute changes, not attributed to clinical condition;No change in respiratory status 06/15/2017  8:00 PM  Status Infusing tube feed;Irrigated 06/15/2017  8:00 PM  Drainage Appearance Bile 06/05/2017  8:00 PM  Intake (mL) 50 mL 06/14/2017  8:00 PM  Output (mL) 140 mL 06/02/2017  6:00 PM     Urethral Catheter [employer]mond SchoolAshley E., RN Temperature probe 14 Fr. (Active)  Indication for Insertion or  Continuance of Catheter Peri-operative use for selective surgical procedure 06/15/2017  8:00 PM  Site Assessment Clean;Intact 06/15/2017  8:00 PM  Catheter Maintenance Bag below level of bladder;Catheter secured;Drainage bag/tubing not touching floor;Insertion date on drainage bag;No dependent loops;Seal intact 06/15/2017  8:00 PM  Collection Container Standard drainage bag 06/15/2017  8:00 PM  Securement Method Securing device (Describe) 06/15/2017  8:00 PM  Output (mL) 245 mL 06/16/2017  8:00 AM    Microbiology/Sepsis markers: Results for orders placed or performed during the hospital encounter of 05/30/17  MRSA PCR Screening     Status: None   Collection Time: 05/30/17  1:58 PM  Result Value Ref Range Status   MRSA by PCR NEGATIVE NEGATIVE Final    Comment:        The GeneXpert MRSA Assay (FDA approved for NASAL specimens only), is one component of a comprehensive MRSA colonization surveillance program. It is not intended to diagnose MRSA infection nor to guide or monitor treatment for MRSA infections.   Culture, respiratory (NON-Expectorated)     Status: None   Collection Time: 06/05/17  8:19 AM  Result Value Ref Range Status   Specimen Description TRACHEAL ASPIRATE  Final   Special Requests NONE  Final   Gram Stain   Final    ABUNDANT WBC PRESENT, PREDOMINANTLY PMN MODERATE GRAM POSITIVE COCCI IN PAIRS FEW GRAM NEGATIVE RODS FEW GRAM POSITIVE RODS FEW GRAM NEGATIVE DIPLOCOCCI    Culture FEW Consistent with normal respiratory flora.  Final   Report Status 06/07/2017  FINAL  Final  Culture, blood (Routine X 2) w Reflex to ID Panel     Status: None   Collection Time: 06/07/17 11:00 AM  Result Value Ref Range Status   Specimen Description BLOOD RIGHT HAND  Final   Special Requests   Final    BOTTLES DRAWN AEROBIC ONLY Blood Culture results may not be optimal due to an inadequate volume of blood received in culture bottles   Culture NO GROWTH 5 DAYS  Final   Report Status  06/12/2017 FINAL  Final  Culture, blood (Routine X 2) w Reflex to ID Panel     Status: None   Collection Time: 06/07/17  4:02 PM  Result Value Ref Range Status   Specimen Description BLOOD LEFT HAND  Final   Special Requests IN PEDIATRIC BOTTLE Blood Culture adequate volume  Final   Culture NO GROWTH 5 DAYS  Final   Report Status 06/12/2017 FINAL  Final  Culture, blood (routine x 2)     Status: Abnormal   Collection Time: 06/09/17  1:20 PM  Result Value Ref Range Status   Specimen Description BLOOD RIGHT HAND  Final   Special Requests IN PEDIATRIC BOTTLE Blood Culture adequate volume  Final   Culture  Setup Time   Final    GRAM NEGATIVE RODS IN PEDIATRIC BOTTLE CRITICAL RESULT CALLED TO, READ BACK BY AND VERIFIED WITH: A MASTERS PHARMD 0415 06/10/17 A BROWNING    Culture SERRATIA MARCESCENS (A)  Final   Report Status 06/12/2017 FINAL  Final   Organism ID, Bacteria SERRATIA MARCESCENS  Final      Susceptibility   Serratia marcescens - MIC*    CEFAZOLIN >=64 RESISTANT Resistant     CEFEPIME <=1 SENSITIVE Sensitive     CEFTAZIDIME <=1 SENSITIVE Sensitive     CEFTRIAXONE <=1 SENSITIVE Sensitive     CIPROFLOXACIN <=0.25 SENSITIVE Sensitive     GENTAMICIN <=1 SENSITIVE Sensitive     TRIMETH/SULFA <=20 SENSITIVE Sensitive     * SERRATIA MARCESCENS  Blood Culture ID Panel (Reflexed)     Status: Abnormal   Collection Time: 06/09/17  1:20 PM  Result Value Ref Range Status   Enterococcus species NOT DETECTED NOT DETECTED Final   Listeria monocytogenes NOT DETECTED NOT DETECTED Final   Staphylococcus species NOT DETECTED NOT DETECTED Final   Staphylococcus aureus NOT DETECTED NOT DETECTED Final   Streptococcus species NOT DETECTED NOT DETECTED Final   Streptococcus agalactiae NOT DETECTED NOT DETECTED Final   Streptococcus pneumoniae NOT DETECTED NOT DETECTED Final   Streptococcus pyogenes NOT DETECTED NOT DETECTED Final   Acinetobacter baumannii NOT DETECTED NOT DETECTED Final    Enterobacteriaceae species DETECTED (A) NOT DETECTED Final    Comment: Enterobacteriaceae represent a large family of gram-negative bacteria, not a single organism. CRITICAL RESULT CALLED TO, READ BACK BY AND VERIFIED WITH: A MASTERS PHARMD 0415 06/10/17 A BROWNING    Enterobacter cloacae complex NOT DETECTED NOT DETECTED Final   Escherichia coli NOT DETECTED NOT DETECTED Final   Klebsiella oxytoca NOT DETECTED NOT DETECTED Final   Klebsiella pneumoniae NOT DETECTED NOT DETECTED Final   Proteus species NOT DETECTED NOT DETECTED Final   Serratia marcescens DETECTED (A) NOT DETECTED Final    Comment: CRITICAL RESULT CALLED TO, READ BACK BY AND VERIFIED WITH: A MASTERS PHARMD 0415 06/10/17 A BROWNING    Carbapenem resistance NOT DETECTED NOT DETECTED Final   Haemophilus influenzae NOT DETECTED NOT DETECTED Final   Neisseria meningitidis NOT DETECTED NOT DETECTED Final  Pseudomonas aeruginosa NOT DETECTED NOT DETECTED Final   Candida albicans NOT DETECTED NOT DETECTED Final   Candida glabrata NOT DETECTED NOT DETECTED Final   Candida krusei NOT DETECTED NOT DETECTED Final   Candida parapsilosis NOT DETECTED NOT DETECTED Final   Candida tropicalis NOT DETECTED NOT DETECTED Final  Culture, blood (routine x 2)     Status: Abnormal   Collection Time: 06/09/17  1:27 PM  Result Value Ref Range Status   Specimen Description BLOOD RIGHT HAND  Final   Special Requests IN PEDIATRIC BOTTLE Blood Culture adequate volume  Final   Culture  Setup Time   Final    GRAM NEGATIVE RODS IN PEDIATRIC BOTTLE CRITICAL RESULT CALLED TO, READ BACK BY AND VERIFIED WITH: A MASTERS PHARMD 0415 06/10/17 A BROWNING    Culture (A)  Final    SERRATIA MARCESCENS SUSCEPTIBILITIES PERFORMED ON PREVIOUS CULTURE WITHIN THE LAST 5 DAYS.    Report Status 06/12/2017 FINAL  Final  Culture, Urine     Status: None   Collection Time: 06/09/17  2:22 PM  Result Value Ref Range Status   Specimen Description URINE, CATHETERIZED   Final   Special Requests Normal  Final   Culture NO GROWTH  Final   Report Status 06/10/2017 FINAL  Final  Culture, respiratory (NON-Expectorated)     Status: None   Collection Time: 06/09/17  2:36 PM  Result Value Ref Range Status   Specimen Description TRACHEAL ASPIRATE  Final   Special Requests Normal  Final   Gram Stain   Final    FEW WBC PRESENT, PREDOMINANTLY PMN RARE GRAM NEGATIVE RODS RARE GRAM POSITIVE COCCI IN CLUSTERS    Culture   Final    MODERATE PSEUDOMONAS AERUGINOSA MODERATE STENOTROPHOMONAS MALTOPHILIA    Report Status 06/13/2017 FINAL  Final   Organism ID, Bacteria PSEUDOMONAS AERUGINOSA  Final   Organism ID, Bacteria STENOTROPHOMONAS MALTOPHILIA  Final      Susceptibility   Pseudomonas aeruginosa - MIC*    CEFTAZIDIME 4 SENSITIVE Sensitive     CIPROFLOXACIN <=0.25 SENSITIVE Sensitive     GENTAMICIN <=1 SENSITIVE Sensitive     IMIPENEM <=0.25 SENSITIVE Sensitive     PIP/TAZO 8 SENSITIVE Sensitive     CEFEPIME 2 SENSITIVE Sensitive     * MODERATE PSEUDOMONAS AERUGINOSA   Stenotrophomonas maltophilia - MIC*    LEVOFLOXACIN 1 SENSITIVE Sensitive     TRIMETH/SULFA <=20 SENSITIVE Sensitive     * MODERATE STENOTROPHOMONAS MALTOPHILIA  Culture, blood (Routine X 2) w Reflex to ID Panel     Status: None (Preliminary result)   Collection Time: 06/14/17  2:07 PM  Result Value Ref Range Status   Specimen Description BLOOD LEFT HAND  Final   Special Requests IN PEDIATRIC BOTTLE Blood Culture adequate volume  Final   Culture NO GROWTH 1 DAY  Final   Report Status PENDING  Incomplete  Culture, blood (Routine X 2) w Reflex to ID Panel     Status: None (Preliminary result)   Collection Time: 06/14/17  2:22 PM  Result Value Ref Range Status   Specimen Description BLOOD LEFT ANTECUBITAL  Final   Special Requests   Final    BOTTLES DRAWN AEROBIC ONLY Blood Culture adequate volume   Culture NO GROWTH 1 DAY  Final   Report Status PENDING  Incomplete    Anti-infectives:   Anti-infectives (From admission, onward)   Start     Dose/Rate Route Frequency Ordered Stop   06/15/17 1534  tobramycin (NEBCIN) powder  Status:  Discontinued       As needed 06/15/17 1535 06/15/17 1622   06/15/17 1533  vancomycin (VANCOCIN) powder  Status:  Discontinued       As needed 06/15/17 1533 06/15/17 1622   06/12/17 1530  ceFEPIme (MAXIPIME) 1 g in dextrose 5 % 50 mL IVPB  Status:  Discontinued     1 g 100 mL/hr over 30 Minutes Intravenous Every 8 hours 06/12/17 1444 06/15/17 1410   06/12/17 1400  levofloxacin (LEVAQUIN) IVPB 750 mg     750 mg 100 mL/hr over 90 Minutes Intravenous Every 24 hours 06/12/17 1315     06/10/17 1200  cefTRIAXone (ROCEPHIN) 2 g in dextrose 5 % 50 mL IVPB  Status:  Discontinued     2 g 100 mL/hr over 30 Minutes Intravenous Every 24 hours 06/10/17 1135 06/12/17 1437   06/10/17 0500  gentamicin (GARAMYCIN) 400 mg in dextrose 5 % 100 mL IVPB     400 mg 110 mL/hr over 60 Minutes Intravenous  Once 06/10/17 0436 06/10/17 0550   06/08/17 1243  vancomycin (VANCOCIN) powder  Status:  Discontinued       As needed 06/08/17 1243 06/08/17 1349   06/07/17 1000  vancomycin (VANCOCIN) 1,250 mg in sodium chloride 0.9 % 250 mL IVPB  Status:  Discontinued     1,250 mg 166.7 mL/hr over 90 Minutes Intravenous Every 8 hours 06/07/17 0804 06/12/17 1307   06/05/17 1700  vancomycin (VANCOCIN) 1,250 mg in sodium chloride 0.9 % 250 mL IVPB  Status:  Discontinued     1,250 mg 166.7 mL/hr over 90 Minutes Intravenous Every 8 hours 06/05/17 1625 06/07/17 0746   06/05/17 0830  piperacillin-tazobactam (ZOSYN) IVPB 3.375 g  Status:  Discontinued     3.375 g 12.5 mL/hr over 240 Minutes Intravenous Every 8 hours 06/05/17 0803 06/10/17 0933   06/03/17 1600  vancomycin (VANCOCIN) IVPB 1000 mg/200 mL premix  Status:  Discontinued     1,000 mg 200 mL/hr over 60 Minutes Intravenous Every 12 hours 06/03/17 1417 06/05/17 1625   06/03/17 1226  tobramycin (NEBCIN) powder  Status:   Discontinued       As needed 06/03/17 1227 06/03/17 1326   06/03/17 1225  vancomycin (VANCOCIN) powder  Status:  Discontinued       As needed 06/03/17 1226 06/03/17 1326   05/30/17 0930  ceFAZolin (ANCEF) IVPB 1 g/50 mL premix  Status:  Discontinued     1 g 100 mL/hr over 30 Minutes Intravenous Every 8 hours 05/30/17 0917 06/03/17 1417   05/30/17 0600  ceFAZolin (ANCEF) IVPB 1 g/50 mL premix     1 g 100 mL/hr over 30 Minutes Intravenous  Once 05/30/17 0556 05/30/17 8841      Best Practice/Protocols:  VTE Prophylaxis: Lovenox (prophylaxtic dose) and Mechanical GI Prophylaxis: Proton Pump Inhibitor Continous Sedation  Consults: Treatment Team:  Md, Trauma, MD Haddix, Gillie Manners, MD Ditty, Loura Halt, MD    Events:  Subjective:    Overnight Issues: Still on IV Dilaudid and Precedex, but Versed has been discontinues by the nurse.  Patient is doing fine.  Objective:  Vital signs for last 24 hours: Temp:  [95.7 F (35.4 C)-101.7 F (38.7 C)] 99.3 F (37.4 C) (01/15 0800) Pulse Rate:  [74-145] 83 (01/15 0800) Resp:  [12-25] 15 (01/15 0800) BP: (85-133)/(44-91) 133/88 (01/15 0800) SpO2:  [99 %-100 %] 100 % (01/15 0823) FiO2 (%):  [30 %-100 %] 30 % (01/15 0823)  Hemodynamic parameters for last  24 hours: CVP:  [3 mmHg-15 mmHg] 15 mmHg  Intake/Output from previous day: 01/14 0701 - 01/15 0700 In: 6929.5 [P.O.:3; I.V.:5844.8; Blood:30; NG/GT:651.7; IV Piggyback:400] Out: 5525 [Urine:5450; Drains:25; Blood:50]  Intake/Output this shift: Total I/O In: 204.2 [P.O.:3; I.V.:151.2; NG/GT:50] Out: 245 [Urine:245]  Vent settings for last 24 hours: Vent Mode: PSV;CPAP FiO2 (%):  [30 %-100 %] 30 % Set Rate:  [15 bmp] 15 bmp PEEP:  [5 cmH20] 5 cmH20 Pressure Support:  [5 cmH20] 5 cmH20 Plateau Pressure:  [14 cmH20-19 cmH20] 15 cmH20  Physical Exam:  General: no respiratory distress and started weaning this AM and is doing fine for now. Neuro: nonfocal exam and RASS  -1 Resp: clear to auscultation bilaterally and no CXR today CVS: regular rate and rhythm, S1, S2 normal, no murmur, click, rub or gallop GI: soft, tolerating tube feedings, has very good bowel sounds. Extremities: edema 2+ and all surgery on her extremities has been done.  Results for orders placed or performed during the hospital encounter of 05/30/17 (from the past 24 hour(s))  Glucose, capillary     Status: Abnormal   Collection Time: 06/15/17 11:19 AM  Result Value Ref Range   Glucose-Capillary 140 (H) 65 - 99 mg/dL  Glucose, capillary     Status: Abnormal   Collection Time: 06/15/17 11:19 AM  Result Value Ref Range   Glucose-Capillary 140 (H) 65 - 99 mg/dL  Urinalysis, Routine w reflex microscopic     Status: Abnormal   Collection Time: 06/15/17 12:50 PM  Result Value Ref Range   Color, Urine YELLOW YELLOW   APPearance CLEAR CLEAR   Specific Gravity, Urine 1.012 1.005 - 1.030   pH 6.0 5.0 - 8.0   Glucose, UA NEGATIVE NEGATIVE mg/dL   Hgb urine dipstick SMALL (A) NEGATIVE   Bilirubin Urine NEGATIVE NEGATIVE   Ketones, ur NEGATIVE NEGATIVE mg/dL   Protein, ur 30 (A) NEGATIVE mg/dL   Nitrite NEGATIVE NEGATIVE   Leukocytes, UA NEGATIVE NEGATIVE   RBC / HPF 6-30 0 - 5 RBC/hpf   WBC, UA 6-30 0 - 5 WBC/hpf   Bacteria, UA RARE (A) NONE SEEN   Squamous Epithelial / LPF NONE SEEN NONE SEEN   Mucus PRESENT    Hyaline Casts, UA PRESENT   CBC     Status: Abnormal   Collection Time: 06/15/17  5:30 PM  Result Value Ref Range   WBC 13.3 (H) 4.0 - 10.5 K/uL   RBC 2.53 (L) 3.87 - 5.11 MIL/uL   Hemoglobin 7.1 (L) 12.0 - 15.0 g/dL   HCT 16.1 (L) 09.6 - 04.5 %   MCV 87.4 78.0 - 100.0 fL   MCH 28.1 26.0 - 34.0 pg   MCHC 32.1 30.0 - 36.0 g/dL   RDW 40.9 81.1 - 91.4 %   Platelets 484 (H) 150 - 400 K/uL  Glucose, capillary     Status: Abnormal   Collection Time: 06/15/17  8:54 PM  Result Value Ref Range   Glucose-Capillary 101 (H) 65 - 99 mg/dL  CBC with Differential/Platelet      Status: Abnormal   Collection Time: 06/16/17  2:53 AM  Result Value Ref Range   WBC 13.5 (H) 4.0 - 10.5 K/uL   RBC 2.42 (L) 3.87 - 5.11 MIL/uL   Hemoglobin 6.7 (LL) 12.0 - 15.0 g/dL   HCT 78.2 (L) 95.6 - 21.3 %   MCV 86.8 78.0 - 100.0 fL   MCH 28.1 26.0 - 34.0 pg   MCHC 32.4 30.0 - 36.0  g/dL   RDW 16.1 09.6 - 04.5 %   Platelets 482 (H) 150 - 400 K/uL   Neutrophils Relative % 74 %   Neutro Abs 10.0 (H) 1.7 - 7.7 K/uL   Lymphocytes Relative 14 %   Lymphs Abs 1.9 0.7 - 4.0 K/uL   Monocytes Relative 11 %   Monocytes Absolute 1.5 (H) 0.1 - 1.0 K/uL   Eosinophils Relative 1 %   Eosinophils Absolute 0.1 0.0 - 0.7 K/uL   Basophils Relative 0 %   Basophils Absolute 0.0 0.0 - 0.1 K/uL  Basic metabolic panel     Status: Abnormal   Collection Time: 06/16/17  2:53 AM  Result Value Ref Range   Sodium 133 (L) 135 - 145 mmol/L   Potassium 3.4 (L) 3.5 - 5.1 mmol/L   Chloride 100 (L) 101 - 111 mmol/L   CO2 22 22 - 32 mmol/L   Glucose, Bld 124 (H) 65 - 99 mg/dL   BUN 12 6 - 20 mg/dL   Creatinine, Ser <4.09 (L) 0.44 - 1.00 mg/dL   Calcium 8.6 (L) 8.9 - 10.3 mg/dL   GFR calc non Af Amer NOT CALCULATED >60 mL/min   GFR calc Af Amer NOT CALCULATED >60 mL/min   Anion gap 11 5 - 15  Type and screen [facility]     Status: None (Preliminary result)   Collection Time: 06/16/17  3:45 AM  Result Value Ref Range   ABO/RH(D) O POS    Antibody Screen NEG    Sample Expiration 06/19/2017    Unit Number [id number]    Blood Component Type RED CELLS,LR    Unit division 00    Status of Unit ISSUED    Transfusion Status OK TO TRANSFUSE    Crossmatch Result Compatible    Unit Number [id number]    Blood Component Type RED CELLS,LR    Unit division 00    Status of Unit ALLOCATED    Transfusion Status OK TO TRANSFUSE    Crossmatch Result Compatible   Prepare RBC     Status: None   Collection Time: 06/16/17  3:45 AM  Result Value Ref Range   Order Confirmation ORDER PROCESSED BY  BLOOD BANK   Glucose, capillary     Status: Abnormal   Collection Time: 06/16/17  3:49 AM  Result Value Ref Range   Glucose-Capillary 115 (H) 65 - 99 mg/dL     Assessment/Plan:   NEURO  Altered Mental Status:  sedation   Plan: Wean sedation for possible extubation in the next 24-48 hours.  PULM  Atelectasis/collapse (focal)   Plan: VDRF, weaning well this morning as we back off on the sedation.  CARDIO  No specific issues   Plan: CPM  RENAL  Urine output and renal function are good.   Plan: May give some Lasix after blood is administered this AM  GI  NO specific issues   Plan: CPM.  Continue tube feedings.  ID  Pneumonia (hospital acquired (not ventilator-associated) being treated with the appropriate antibiotics.) Bacteremia (gram negative rod)   Plan: CPM.  Remove her central line when we can get her off as many drips as possible  HEME  Anemia acute blood loss anemia)   Plan: Getting two units of blood  ENDO No specific issues   Plan: CPM  Global Issues  Patient starting to wean and doing okay for now.  BP not as unstable with the additional volume as her CVP is up to 14.  Getting blood and may get some Lasix afterwards.  As we awaken her we may be able to come off of some of the pressors and other sedatives and drips.  Ultimately would like to give patient the chance for extubation if possible.    LOS: 17 days   Additional comments:I reviewed the patient's new clinical lab test results. cbc/bmet and I reviewed the patients new imaging test results. cxr  Critical Care Total Time*: 30 Minutes  Jimmye Norman 06/16/2017  *Care during the described time interval was provided by me and/or other providers on the critical care team.  I have reviewed this patient's available data, including medical history, events of note, physical examination and test results as part of my evaluation.

## 2017-06-16 NOTE — Progress Notes (Signed)
Patient's heart rate increased from 80s-90s to 120s then quickly to 170s.  Monitor show it as SVT.  Dr Lindie SpruceWyatt was called, Metoprolol was given, 2mg  bolus versed, and a 12 lead EKG run.  EKG reading was ST with rate of 132. Will continue to monitor and keep Dr Lindie SpruceWyatt updated.

## 2017-06-16 NOTE — Progress Notes (Signed)
Cervical MRI still not performed. Will await results for final recommendations. Continue Aspen collar for now.
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
## 2017-06-16 NOTE — Progress Notes (Deleted)
eLink Physician-Brief Progress Note Patient Name: Briana [name] DOB: 11/07/1995 MRN: [id number]   Date of Service  06/16/2017  HPI/Events of Note  Anemia - Hgb = 6.7.  eICU Interventions  Will transfuse 1 unit PRBC now.      Intervention Category Major Interventions: Other:  Lenell AntuSommer,Malikye Reppond Eugene 06/16/2017, 4:41 AM

## 2017-06-17 ENCOUNTER — Encounter (HOSPITAL_COMMUNITY): Payer: Self-pay | Admitting: Student

## 2017-06-17 ENCOUNTER — Inpatient Hospital Stay (HOSPITAL_COMMUNITY): Payer: No Typology Code available for payment source

## 2017-06-17 DIAGNOSIS — J158 Pneumonia due to other specified bacteria: Secondary | ICD-10-CM

## 2017-06-17 DIAGNOSIS — Z9911 Dependence on respirator [ventilator] status: Secondary | ICD-10-CM

## 2017-06-17 DIAGNOSIS — R000 Tachycardia, unspecified: Secondary | ICD-10-CM

## 2017-06-17 DIAGNOSIS — J9690 Respiratory failure, unspecified, unspecified whether with hypoxia or hypercapnia: Secondary | ICD-10-CM

## 2017-06-17 DIAGNOSIS — J151 Pneumonia due to Pseudomonas: Secondary | ICD-10-CM

## 2017-06-17 DIAGNOSIS — J156 Pneumonia due to other aerobic Gram-negative bacteria: Secondary | ICD-10-CM

## 2017-06-17 LAB — CBC WITH DIFFERENTIAL/PLATELET
BASOS PCT: 1 %
Basophils Absolute: 0.1 10*3/uL (ref 0.0–0.1)
Eosinophils Absolute: 0.1 10*3/uL (ref 0.0–0.7)
Eosinophils Relative: 1 %
HEMATOCRIT: 29.7 % — AB (ref 36.0–46.0)
HEMOGLOBIN: 9.8 g/dL — AB (ref 12.0–15.0)
LYMPHS ABS: 2.4 10*3/uL (ref 0.7–4.0)
LYMPHS PCT: 22 %
MCH: 29.1 pg (ref 26.0–34.0)
MCHC: 33 g/dL (ref 30.0–36.0)
MCV: 88.1 fL (ref 78.0–100.0)
MONOS PCT: 11 %
Monocytes Absolute: 1.2 10*3/uL — ABNORMAL HIGH (ref 0.1–1.0)
NEUTROS ABS: 7.2 10*3/uL (ref 1.7–7.7)
NEUTROS PCT: 65 %
Platelets: 526 10*3/uL — ABNORMAL HIGH (ref 150–400)
RBC: 3.37 MIL/uL — ABNORMAL LOW (ref 3.87–5.11)
RDW: 15 % (ref 11.5–15.5)
WBC: 10.9 10*3/uL — ABNORMAL HIGH (ref 4.0–10.5)

## 2017-06-17 LAB — GLUCOSE, CAPILLARY
GLUCOSE-CAPILLARY: 120 mg/dL — AB (ref 65–99)
GLUCOSE-CAPILLARY: 87 mg/dL (ref 65–99)
GLUCOSE-CAPILLARY: 91 mg/dL (ref 65–99)
Glucose-Capillary: 122 mg/dL — ABNORMAL HIGH (ref 65–99)
Glucose-Capillary: 121 mg/dL — ABNORMAL HIGH (ref 65–99)
Glucose-Capillary: 86 mg/dL (ref 65–99)

## 2017-06-17 LAB — BASIC METABOLIC PANEL
Anion gap: 10 (ref 5–15)
BUN: 12 mg/dL (ref 6–20)
CHLORIDE: 109 mmol/L (ref 101–111)
CO2: 23 mmol/L (ref 22–32)
Calcium: 9.8 mg/dL (ref 8.9–10.3)
Glucose, Bld: 156 mg/dL — ABNORMAL HIGH (ref 65–99)
Potassium: 3.8 mmol/L (ref 3.5–5.1)
Sodium: 142 mmol/L (ref 135–145)

## 2017-06-17 LAB — POCT I-STAT 4, (NA,K, GLUC, HGB,HCT)
Glucose, Bld: 127 mg/dL — ABNORMAL HIGH (ref 65–99)
HCT: 22 % — ABNORMAL LOW (ref 36.0–46.0)
HEMOGLOBIN: 7.5 g/dL — AB (ref 12.0–15.0)
POTASSIUM: 3.8 mmol/L (ref 3.5–5.1)
SODIUM: 142 mmol/L (ref 135–145)

## 2017-06-17 LAB — BPAM RBC
BLOOD PRODUCT EXPIRATION DATE: 201902122359
BLOOD PRODUCT EXPIRATION DATE: 201902122359
ISSUE DATE / TIME: 201901150548
ISSUE DATE / TIME: 201901150926
UNIT TYPE AND RH: 5100
Unit Type and Rh: 5100

## 2017-06-17 LAB — PREALBUMIN: Prealbumin: 11.5 mg/dL — ABNORMAL LOW (ref 18–38)

## 2017-06-17 LAB — TYPE AND SCREEN
ABO/RH(D): O POS
ANTIBODY SCREEN: NEGATIVE
UNIT DIVISION: 0
Unit division: 0

## 2017-06-17 MED ORDER — DEXMEDETOMIDINE HCL IN NACL 200 MCG/50ML IV SOLN
0.4000 ug/kg/h | INTRAVENOUS | Status: DC
  Administered 2017-06-17: 0.8 ug/kg/h via INTRAVENOUS
  Filled 2017-06-17: qty 50

## 2017-06-17 MED ORDER — ACETAMINOPHEN 650 MG RE SUPP
650.0000 mg | Freq: Four times a day (QID) | RECTAL | Status: DC | PRN
  Administered 2017-06-17 – 2017-06-18 (×2): 650 mg via RECTAL
  Filled 2017-06-17 (×2): qty 1

## 2017-06-17 MED ORDER — SODIUM CHLORIDE 0.9 % IV SOLN
1.0000 mg/h | INTRAVENOUS | Status: DC
  Administered 2017-06-17: 4 mg/h via INTRAVENOUS
  Administered 2017-06-18 – 2017-06-19 (×2): 2 mg/h via INTRAVENOUS
  Filled 2017-06-17 (×3): qty 5

## 2017-06-17 MED ORDER — DEXMEDETOMIDINE HCL IN NACL 400 MCG/100ML IV SOLN
0.4000 ug/kg/h | INTRAVENOUS | Status: DC
  Administered 2017-06-18: 1.1 ug/kg/h via INTRAVENOUS
  Filled 2017-06-17: qty 100

## 2017-06-17 MED ORDER — MIDAZOLAM HCL 2 MG/2ML IJ SOLN
0.5000 mg | INTRAMUSCULAR | Status: DC | PRN
  Administered 2017-06-17 – 2017-06-19 (×2): 1 mg via INTRAVENOUS
  Filled 2017-06-17 (×2): qty 2

## 2017-06-17 MED ORDER — ORAL CARE MOUTH RINSE
15.0000 mL | Freq: Two times a day (BID) | OROMUCOSAL | Status: DC
  Administered 2017-06-18 – 2017-06-24 (×12): 15 mL via OROMUCOSAL

## 2017-06-17 MED ORDER — HYDROMORPHONE HCL 1 MG/ML IJ SOLN
0.5000 mg | INTRAMUSCULAR | Status: DC | PRN
  Administered 2017-06-17 – 2017-06-24 (×4): 0.5 mg via INTRAVENOUS
  Filled 2017-06-17 (×4): qty 0.5

## 2017-06-17 MED ORDER — CHLORHEXIDINE GLUCONATE 0.12 % MT SOLN
15.0000 mL | Freq: Two times a day (BID) | OROMUCOSAL | Status: DC
  Administered 2017-06-17 – 2017-06-24 (×13): 15 mL via OROMUCOSAL
  Filled 2017-06-17 (×9): qty 15
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
## 2017-06-17 NOTE — Progress Notes (Signed)
When doing shift assessment, noticed the patient's 1400 dose of levofloxacin antibiotic had not infused, although it was hooked to a secondary line. The secondary line was unclamped. Upon further investigation, the bag was not completely spiked all the way through. Pharmacy notified, they stated they will change the time of her antibiotic to 2030. Antibiotic was then infused after time change. Will continue to monitor.

## 2017-06-17 NOTE — Procedures (Signed)
Extubation Procedure Note  Patient Details:   Name: Briana [name] DOB: 12/05/1995 MRN: [id number]   Airway Documentation:     Evaluation  O2 sats: stable throughout Complications: No apparent complications Patient did tolerate procedure well. Bilateral Breath Sounds: Diminished   Unaware if patient able to vocalize. Pt just continues to cough. Pt was able to breathe around deflated cuff. No stridor noted. Pt placed on 3L [place].  Briana [name], Briana [name] 06/17/2017, 11:30 AM
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
## 2017-06-17 NOTE — Progress Notes (Signed)
30 mL of versed from drip wasted in sick with Gwynn BurlyJonathan Smith, RN

## 2017-06-17 NOTE — Progress Notes (Signed)
[facility]   Reason for visit: Follow up on bacteremia/pneumonia  Interval History: 1/13 blood cultures x 2 remains ngtd; Tmax 101.5, WBC down to 10.9, tachycardic to 160; plans for extubation today Day 6 levaquin Day 19 total antibiotics   Physical Exam: Constitutional:  Vitals:   06/17/17 0830 06/17/17 0845  BP: 96/62 (!) 97/57  Pulse: 90 84  Resp: 13 13  Temp: 98.1 F (36.7 C) 98.1 F (36.7 C)  SpO2: 96% 95%   sedated, eyes open Eyes: anicteric HENT:+ET Respiratory: Normal respiratory effort; CTA B Cardiovascular: tachy RR GI: soft, nt, nd  Review of Systems: Unable to be assessed due to patient factors  Lab Results  Component Value Date   WBC 10.9 (H) 06/17/2017   HGB 9.8 (L) 06/17/2017   HCT 29.7 (L) 06/17/2017   MCV 88.1 06/17/2017   PLT 526 (H) 06/17/2017    Lab Results  Component Value Date   CREATININE <0.30 (L) 06/17/2017   BUN 12 06/17/2017   NA 142 06/17/2017   K 3.8 06/17/2017   CL 109 06/17/2017   CO2 23 06/17/2017    Lab Results  Component Value Date   ALT 66 (H) 06/15/2017   AST 74 (H) 06/15/2017   ALKPHOS 228 (H) 06/15/2017     Microbiology: Recent Results (from the past 240 hour(s))  Culture, blood (Routine X 2) w Reflex to ID Panel     Status: None   Collection Time: 06/07/17 11:00 AM  Result Value Ref Range Status   Specimen Description BLOOD RIGHT HAND  Final   Special Requests   Final    BOTTLES DRAWN AEROBIC ONLY Blood Culture results may not be optimal due to an inadequate volume of blood received in culture bottles   Culture NO GROWTH 5 DAYS  Final   Report Status 06/12/2017 FINAL  Final  Culture, blood (Routine X 2) w Reflex to ID Panel     Status: None   Collection Time: 06/07/17  4:02 PM  Result Value Ref Range Status   Specimen Description BLOOD LEFT HAND  Final   Special Requests IN PEDIATRIC BOTTLE Blood Culture adequate volume  Final   Culture NO GROWTH 5 DAYS  Final   Report Status  06/12/2017 FINAL  Final  Culture, blood (routine x 2)     Status: Abnormal   Collection Time: 06/09/17  1:20 PM  Result Value Ref Range Status   Specimen Description BLOOD RIGHT HAND  Final   Special Requests IN PEDIATRIC BOTTLE Blood Culture adequate volume  Final   Culture  Setup Time   Final    GRAM NEGATIVE RODS IN PEDIATRIC BOTTLE CRITICAL RESULT CALLED TO, READ BACK BY AND VERIFIED WITH: A MASTERS PHARMD 0415 06/10/17 A BROWNING    Culture SERRATIA MARCESCENS (A)  Final   Report Status 06/12/2017 FINAL  Final   Organism ID, Bacteria SERRATIA MARCESCENS  Final      Susceptibility   Serratia marcescens - MIC*    CEFAZOLIN >=64 RESISTANT Resistant     CEFEPIME <=1 SENSITIVE Sensitive     CEFTAZIDIME <=1 SENSITIVE Sensitive     CEFTRIAXONE <=1 SENSITIVE Sensitive     CIPROFLOXACIN <=0.25 SENSITIVE Sensitive     GENTAMICIN <=1 SENSITIVE Sensitive     TRIMETH/SULFA <=20 SENSITIVE Sensitive     * SERRATIA MARCESCENS  Blood Culture ID Panel (Reflexed)     Status: Abnormal   Collection Time: 06/09/17  1:20 PM  Result Value Ref  Range Status   Enterococcus species NOT DETECTED NOT DETECTED Final   Listeria monocytogenes NOT DETECTED NOT DETECTED Final   Staphylococcus species NOT DETECTED NOT DETECTED Final   Staphylococcus aureus NOT DETECTED NOT DETECTED Final   Streptococcus species NOT DETECTED NOT DETECTED Final   Streptococcus agalactiae NOT DETECTED NOT DETECTED Final   Streptococcus pneumoniae NOT DETECTED NOT DETECTED Final   Streptococcus pyogenes NOT DETECTED NOT DETECTED Final   Acinetobacter baumannii NOT DETECTED NOT DETECTED Final   Enterobacteriaceae species DETECTED (A) NOT DETECTED Final    Comment: Enterobacteriaceae represent a large family of gram-negative bacteria, not a single organism. CRITICAL RESULT CALLED TO, READ BACK BY AND VERIFIED WITH: A MASTERS PHARMD 0415 06/10/17 A BROWNING    Enterobacter cloacae complex NOT DETECTED NOT DETECTED Final    Escherichia coli NOT DETECTED NOT DETECTED Final   Klebsiella oxytoca NOT DETECTED NOT DETECTED Final   Klebsiella pneumoniae NOT DETECTED NOT DETECTED Final   Proteus species NOT DETECTED NOT DETECTED Final   Serratia marcescens DETECTED (A) NOT DETECTED Final    Comment: CRITICAL RESULT CALLED TO, READ BACK BY AND VERIFIED WITH: A MASTERS PHARMD 0415 06/10/17 A BROWNING    Carbapenem resistance NOT DETECTED NOT DETECTED Final   Haemophilus influenzae NOT DETECTED NOT DETECTED Final   Neisseria meningitidis NOT DETECTED NOT DETECTED Final   Pseudomonas aeruginosa NOT DETECTED NOT DETECTED Final   Candida albicans NOT DETECTED NOT DETECTED Final   Candida glabrata NOT DETECTED NOT DETECTED Final   Candida krusei NOT DETECTED NOT DETECTED Final   Candida parapsilosis NOT DETECTED NOT DETECTED Final   Candida tropicalis NOT DETECTED NOT DETECTED Final  Culture, blood (routine x 2)     Status: Abnormal   Collection Time: 06/09/17  1:27 PM  Result Value Ref Range Status   Specimen Description BLOOD RIGHT HAND  Final   Special Requests IN PEDIATRIC BOTTLE Blood Culture adequate volume  Final   Culture  Setup Time   Final    GRAM NEGATIVE RODS IN PEDIATRIC BOTTLE CRITICAL RESULT CALLED TO, READ BACK BY AND VERIFIED WITH: A MASTERS PHARMD 0415 06/10/17 A BROWNING    Culture (A)  Final    SERRATIA MARCESCENS SUSCEPTIBILITIES PERFORMED ON PREVIOUS CULTURE WITHIN THE LAST 5 DAYS.    Report Status 06/12/2017 FINAL  Final  Culture, Urine     Status: None   Collection Time: 06/09/17  2:22 PM  Result Value Ref Range Status   Specimen Description URINE, CATHETERIZED  Final   Special Requests Normal  Final   Culture NO GROWTH  Final   Report Status 06/10/2017 FINAL  Final  Culture, respiratory (NON-Expectorated)     Status: None   Collection Time: 06/09/17  2:36 PM  Result Value Ref Range Status   Specimen Description TRACHEAL ASPIRATE  Final   Special Requests Normal  Final   Gram Stain    Final    FEW WBC PRESENT, PREDOMINANTLY PMN RARE GRAM NEGATIVE RODS RARE GRAM POSITIVE COCCI IN CLUSTERS    Culture   Final    MODERATE PSEUDOMONAS AERUGINOSA MODERATE STENOTROPHOMONAS MALTOPHILIA    Report Status 06/13/2017 FINAL  Final   Organism ID, Bacteria PSEUDOMONAS AERUGINOSA  Final   Organism ID, Bacteria STENOTROPHOMONAS MALTOPHILIA  Final      Susceptibility   Pseudomonas aeruginosa - MIC*    CEFTAZIDIME 4 SENSITIVE Sensitive     CIPROFLOXACIN <=0.25 SENSITIVE Sensitive     GENTAMICIN <=1 SENSITIVE Sensitive     IMIPENEM <=  0.25 SENSITIVE Sensitive     PIP/TAZO 8 SENSITIVE Sensitive     CEFEPIME 2 SENSITIVE Sensitive     * MODERATE PSEUDOMONAS AERUGINOSA   Stenotrophomonas maltophilia - MIC*    LEVOFLOXACIN 1 SENSITIVE Sensitive     TRIMETH/SULFA <=20 SENSITIVE Sensitive     * MODERATE STENOTROPHOMONAS MALTOPHILIA  Culture, blood (Routine X 2) w Reflex to ID Panel     Status: None (Preliminary result)   Collection Time: 06/14/17  2:07 PM  Result Value Ref Range Status   Specimen Description BLOOD LEFT HAND  Final   Special Requests IN PEDIATRIC BOTTLE Blood Culture adequate volume  Final   Culture NO GROWTH 2 DAYS  Final   Report Status PENDING  Incomplete  Culture, blood (Routine X 2) w Reflex to ID Panel     Status: None (Preliminary result)   Collection Time: 06/14/17  2:22 PM  Result Value Ref Range Status   Specimen Description BLOOD LEFT ANTECUBITAL  Final   Special Requests   Final    BOTTLES DRAWN AEROBIC ONLY Blood Culture adequate volume   Culture NO GROWTH 2 DAYS  Final   Report Status PENDING  Incomplete  Culture, Urine     Status: None   Collection Time: 06/15/17 12:50 PM  Result Value Ref Range Status   Specimen Description URINE, CATHETERIZED  Final   Special Requests NONE  Final   Culture NO GROWTH  Final   Report Status 06/16/2017 FINAL  Final    Impression/Plan:  1. Pneumonia - Stenotrophomonas, Pseudomonas, FG sensitive.  Continue  levaquin  2.  Bacteremia - Serratia and FQ sensitive. Continue as above  3.  Respiratory failure - plans for extubation soon.
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
## 2017-06-17 NOTE — Progress Notes (Signed)
Follow up - Trauma and Critical Care  Patient Details:    Briana [name] is an 22 y.o. female.  Lines/tubes : Airway 7 mm (Active)  Secured at (cm) 23 cm 06/16/2017  8:23 AM  Measured From Lips 06/16/2017  8:23 AM  Secured Location Right 06/16/2017  8:23 AM  Secured By [employer] Tube Holder 06/16/2017  8:23 AM  Tube Holder Repositioned Yes 06/16/2017  8:23 AM  Cuff Pressure (cm H2O) 22 cm H2O 06/16/2017  8:23 AM  Site Condition Dry 06/16/2017  3:35 AM     CVC Triple Lumen 05/31/17 Right Subclavian (Active)  Indication for Insertion or Continuance of Line Vasoactive infusions;Prolonged intravenous therapies 06/15/2017  8:00 PM  Site Assessment Clean;Dry;Intact 06/15/2017  8:00 PM  Proximal Lumen Status Infusing 06/15/2017  8:00 PM  Medial Lumen Status Infusing 06/15/2017  8:00 PM  Distal Lumen Status In-line blood sampling system in place 06/15/2017  8:00 PM  Dressing Type Transparent;Occlusive 06/15/2017  8:00 PM  Dressing Status Clean;Dry;Intact;Antimicrobial disc in place 06/15/2017  8:00 PM  Line Care Connections checked and tightened;Proximal tubing changed;Zeroed and calibrated;Leveled 06/15/2017  8:00 PM  Dressing Intervention Dressing changed 06/14/2017  4:00 PM  Dressing Change Due 06/21/17 06/15/2017  8:00 PM     Negative Pressure Wound Therapy Ankle Right (Active)  Output (mL) 25 mL 06/15/2017  6:11 PM     NG/OG Tube Orogastric Center mouth (Active)  External Length of Tube (cm) - (if applicable) 67 cm 06/15/2017  8:00 PM  Site Assessment Clean;Dry;Intact 06/15/2017  8:00 PM  Ongoing Placement Verification No acute changes, not attributed to clinical condition;No change in respiratory status 06/15/2017  8:00 PM  Status Infusing tube feed;Irrigated 06/15/2017  8:00 PM  Drainage Appearance Bile 06/05/2017  8:00 PM  Intake (mL) 50 mL 06/14/2017  8:00 PM  Output (mL) 140 mL 06/02/2017  6:00 PM     Urethral Catheter Redmond SchoolAshley E., RN Temperature probe 14 Fr. (Active)  Indication for Insertion or  Continuance of Catheter Peri-operative use for selective surgical procedure 06/15/2017  8:00 PM  Site Assessment Clean;Intact 06/15/2017  8:00 PM  Catheter Maintenance Bag below level of bladder;Catheter secured;Drainage bag/tubing not touching floor;Insertion date on drainage bag;No dependent loops;Seal intact 06/15/2017  8:00 PM  Collection Container Standard drainage bag 06/15/2017  8:00 PM  Securement Method Securing device (Describe) 06/15/2017  8:00 PM  Output (mL) 245 mL 06/16/2017  8:00 AM    Microbiology/Sepsis markers: Results for orders placed or performed during the hospital encounter of 05/30/17  MRSA PCR Screening     Status: None   Collection Time: 05/30/17  1:58 PM  Result Value Ref Range Status   MRSA by PCR NEGATIVE NEGATIVE Final    Comment:        The GeneXpert MRSA Assay (FDA approved for NASAL specimens only), is one component of a comprehensive MRSA colonization surveillance program. It is not intended to diagnose MRSA infection nor to guide or monitor treatment for MRSA infections.   Culture, respiratory (NON-Expectorated)     Status: None   Collection Time: 06/05/17  8:19 AM  Result Value Ref Range Status   Specimen Description TRACHEAL ASPIRATE  Final   Special Requests NONE  Final   Gram Stain   Final    ABUNDANT WBC PRESENT, PREDOMINANTLY PMN MODERATE GRAM POSITIVE COCCI IN PAIRS FEW GRAM NEGATIVE RODS FEW GRAM POSITIVE RODS FEW GRAM NEGATIVE DIPLOCOCCI    Culture FEW Consistent with normal respiratory flora.  Final   Report Status 06/07/2017  FINAL  Final  Culture, blood (Routine X 2) w Reflex to ID Panel     Status: None   Collection Time: 06/07/17 11:00 AM  Result Value Ref Range Status   Specimen Description BLOOD RIGHT HAND  Final   Special Requests   Final    BOTTLES DRAWN AEROBIC ONLY Blood Culture results may not be optimal due to an inadequate volume of blood received in culture bottles   Culture NO GROWTH 5 DAYS  Final   Report Status  06/12/2017 FINAL  Final  Culture, blood (Routine X 2) w Reflex to ID Panel     Status: None   Collection Time: 06/07/17  4:02 PM  Result Value Ref Range Status   Specimen Description BLOOD LEFT HAND  Final   Special Requests IN PEDIATRIC BOTTLE Blood Culture adequate volume  Final   Culture NO GROWTH 5 DAYS  Final   Report Status 06/12/2017 FINAL  Final  Culture, blood (routine x 2)     Status: Abnormal   Collection Time: 06/09/17  1:20 PM  Result Value Ref Range Status   Specimen Description BLOOD RIGHT HAND  Final   Special Requests IN PEDIATRIC BOTTLE Blood Culture adequate volume  Final   Culture  Setup Time   Final    GRAM NEGATIVE RODS IN PEDIATRIC BOTTLE CRITICAL RESULT CALLED TO, READ BACK BY AND VERIFIED WITH: A MASTERS PHARMD 0415 06/10/17 A BROWNING    Culture SERRATIA MARCESCENS (A)  Final   Report Status 06/12/2017 FINAL  Final   Organism ID, Bacteria SERRATIA MARCESCENS  Final      Susceptibility   Serratia marcescens - MIC*    CEFAZOLIN >=64 RESISTANT Resistant     CEFEPIME <=1 SENSITIVE Sensitive     CEFTAZIDIME <=1 SENSITIVE Sensitive     CEFTRIAXONE <=1 SENSITIVE Sensitive     CIPROFLOXACIN <=0.25 SENSITIVE Sensitive     GENTAMICIN <=1 SENSITIVE Sensitive     TRIMETH/SULFA <=20 SENSITIVE Sensitive     * SERRATIA MARCESCENS  Blood Culture ID Panel (Reflexed)     Status: Abnormal   Collection Time: 06/09/17  1:20 PM  Result Value Ref Range Status   Enterococcus species NOT DETECTED NOT DETECTED Final   Listeria monocytogenes NOT DETECTED NOT DETECTED Final   Staphylococcus species NOT DETECTED NOT DETECTED Final   Staphylococcus aureus NOT DETECTED NOT DETECTED Final   Streptococcus species NOT DETECTED NOT DETECTED Final   Streptococcus agalactiae NOT DETECTED NOT DETECTED Final   Streptococcus pneumoniae NOT DETECTED NOT DETECTED Final   Streptococcus pyogenes NOT DETECTED NOT DETECTED Final   Acinetobacter baumannii NOT DETECTED NOT DETECTED Final    Enterobacteriaceae species DETECTED (A) NOT DETECTED Final    Comment: Enterobacteriaceae represent a large family of gram-negative bacteria, not a single organism. CRITICAL RESULT CALLED TO, READ BACK BY AND VERIFIED WITH: A MASTERS PHARMD 0415 06/10/17 A BROWNING    Enterobacter cloacae complex NOT DETECTED NOT DETECTED Final   Escherichia coli NOT DETECTED NOT DETECTED Final   Klebsiella oxytoca NOT DETECTED NOT DETECTED Final   Klebsiella pneumoniae NOT DETECTED NOT DETECTED Final   Proteus species NOT DETECTED NOT DETECTED Final   Serratia marcescens DETECTED (A) NOT DETECTED Final    Comment: CRITICAL RESULT CALLED TO, READ BACK BY AND VERIFIED WITH: A MASTERS PHARMD 0415 06/10/17 A BROWNING    Carbapenem resistance NOT DETECTED NOT DETECTED Final   Haemophilus influenzae NOT DETECTED NOT DETECTED Final   Neisseria meningitidis NOT DETECTED NOT DETECTED Final  Pseudomonas aeruginosa NOT DETECTED NOT DETECTED Final   Candida albicans NOT DETECTED NOT DETECTED Final   Candida glabrata NOT DETECTED NOT DETECTED Final   Candida krusei NOT DETECTED NOT DETECTED Final   Candida parapsilosis NOT DETECTED NOT DETECTED Final   Candida tropicalis NOT DETECTED NOT DETECTED Final  Culture, blood (routine x 2)     Status: Abnormal   Collection Time: 06/09/17  1:27 PM  Result Value Ref Range Status   Specimen Description BLOOD RIGHT HAND  Final   Special Requests IN PEDIATRIC BOTTLE Blood Culture adequate volume  Final   Culture  Setup Time   Final    GRAM NEGATIVE RODS IN PEDIATRIC BOTTLE CRITICAL RESULT CALLED TO, READ BACK BY AND VERIFIED WITH: A MASTERS PHARMD 0415 06/10/17 A BROWNING    Culture (A)  Final    SERRATIA MARCESCENS SUSCEPTIBILITIES PERFORMED ON PREVIOUS CULTURE WITHIN THE LAST 5 DAYS.    Report Status 06/12/2017 FINAL  Final  Culture, Urine     Status: None   Collection Time: 06/09/17  2:22 PM  Result Value Ref Range Status   Specimen Description URINE, CATHETERIZED   Final   Special Requests Normal  Final   Culture NO GROWTH  Final   Report Status 06/10/2017 FINAL  Final  Culture, respiratory (NON-Expectorated)     Status: None   Collection Time: 06/09/17  2:36 PM  Result Value Ref Range Status   Specimen Description TRACHEAL ASPIRATE  Final   Special Requests Normal  Final   Gram Stain   Final    FEW WBC PRESENT, PREDOMINANTLY PMN RARE GRAM NEGATIVE RODS RARE GRAM POSITIVE COCCI IN CLUSTERS    Culture   Final    MODERATE PSEUDOMONAS AERUGINOSA MODERATE STENOTROPHOMONAS MALTOPHILIA    Report Status 06/13/2017 FINAL  Final   Organism ID, Bacteria PSEUDOMONAS AERUGINOSA  Final   Organism ID, Bacteria STENOTROPHOMONAS MALTOPHILIA  Final      Susceptibility   Pseudomonas aeruginosa - MIC*    CEFTAZIDIME 4 SENSITIVE Sensitive     CIPROFLOXACIN <=0.25 SENSITIVE Sensitive     GENTAMICIN <=1 SENSITIVE Sensitive     IMIPENEM <=0.25 SENSITIVE Sensitive     PIP/TAZO 8 SENSITIVE Sensitive     CEFEPIME 2 SENSITIVE Sensitive     * MODERATE PSEUDOMONAS AERUGINOSA   Stenotrophomonas maltophilia - MIC*    LEVOFLOXACIN 1 SENSITIVE Sensitive     TRIMETH/SULFA <=20 SENSITIVE Sensitive     * MODERATE STENOTROPHOMONAS MALTOPHILIA  Culture, blood (Routine X 2) w Reflex to ID Panel     Status: None (Preliminary result)   Collection Time: 06/14/17  2:07 PM  Result Value Ref Range Status   Specimen Description BLOOD LEFT HAND  Final   Special Requests IN PEDIATRIC BOTTLE Blood Culture adequate volume  Final   Culture NO GROWTH 2 DAYS  Final   Report Status PENDING  Incomplete  Culture, blood (Routine X 2) w Reflex to ID Panel     Status: None (Preliminary result)   Collection Time: 06/14/17  2:22 PM  Result Value Ref Range Status   Specimen Description BLOOD LEFT ANTECUBITAL  Final   Special Requests   Final    BOTTLES DRAWN AEROBIC ONLY Blood Culture adequate volume   Culture NO GROWTH 2 DAYS  Final   Report Status PENDING  Incomplete  Culture, Urine      Status: None   Collection Time: 06/15/17 12:50 PM  Result Value Ref Range Status   Specimen Description URINE, CATHETERIZED  Final   Special Requests NONE  Final   Culture NO GROWTH  Final   Report Status 06/16/2017 FINAL  Final    Anti-infectives:  Anti-infectives (From admission, onward)   Start     Dose/Rate Route Frequency Ordered Stop   06/15/17 1534  tobramycin (NEBCIN) powder  Status:  Discontinued       As needed 06/15/17 1535 06/15/17 1622   06/15/17 1533  vancomycin (VANCOCIN) powder  Status:  Discontinued       As needed 06/15/17 1533 06/15/17 1622   06/12/17 1530  ceFEPIme (MAXIPIME) 1 g in dextrose 5 % 50 mL IVPB  Status:  Discontinued     1 g 100 mL/hr over 30 Minutes Intravenous Every 8 hours 06/12/17 1444 06/15/17 1410   06/12/17 1400  levofloxacin (LEVAQUIN) IVPB 750 mg     750 mg 100 mL/hr over 90 Minutes Intravenous Every 24 hours 06/12/17 1315     06/10/17 1200  cefTRIAXone (ROCEPHIN) 2 g in dextrose 5 % 50 mL IVPB  Status:  Discontinued     2 g 100 mL/hr over 30 Minutes Intravenous Every 24 hours 06/10/17 1135 06/12/17 1437   06/10/17 0500  gentamicin (GARAMYCIN) 400 mg in dextrose 5 % 100 mL IVPB     400 mg 110 mL/hr over 60 Minutes Intravenous  Once 06/10/17 0436 06/10/17 0550   06/08/17 1243  vancomycin (VANCOCIN) powder  Status:  Discontinued       As needed 06/08/17 1243 06/08/17 1349   06/07/17 1000  vancomycin (VANCOCIN) 1,250 mg in sodium chloride 0.9 % 250 mL IVPB  Status:  Discontinued     1,250 mg 166.7 mL/hr over 90 Minutes Intravenous Every 8 hours 06/07/17 0804 06/12/17 1307   06/05/17 1700  vancomycin (VANCOCIN) 1,250 mg in sodium chloride 0.9 % 250 mL IVPB  Status:  Discontinued     1,250 mg 166.7 mL/hr over 90 Minutes Intravenous Every 8 hours 06/05/17 1625 06/07/17 0746   06/05/17 0830  piperacillin-tazobactam (ZOSYN) IVPB 3.375 g  Status:  Discontinued     3.375 g 12.5 mL/hr over 240 Minutes Intravenous Every 8 hours 06/05/17 0803 06/10/17  0933   06/03/17 1600  vancomycin (VANCOCIN) IVPB 1000 mg/200 mL premix  Status:  Discontinued     1,000 mg 200 mL/hr over 60 Minutes Intravenous Every 12 hours 06/03/17 1417 06/05/17 1625   06/03/17 1226  tobramycin (NEBCIN) powder  Status:  Discontinued       As needed 06/03/17 1227 06/03/17 1326   06/03/17 1225  vancomycin (VANCOCIN) powder  Status:  Discontinued       As needed 06/03/17 1226 06/03/17 1326   05/30/17 0930  ceFAZolin (ANCEF) IVPB 1 g/50 mL premix  Status:  Discontinued     1 g 100 mL/hr over 30 Minutes Intravenous Every 8 hours 05/30/17 0917 06/03/17 1417   05/30/17 0600  ceFAZolin (ANCEF) IVPB 1 g/50 mL premix     1 g 100 mL/hr over 30 Minutes Intravenous  Once 05/30/17 0556 05/30/17 1610      Best Practice/Protocols:  VTE Prophylaxis: Lovenox (prophylaxtic dose) and Mechanical GI Prophylaxis: Proton Pump Inhibitor Continous Sedation  Consults: Treatment Team:  Md, Trauma, MD Haddix, Gillie Manners, MD Ditty, Loura Halt, MD   Events:  Subjective:    Overnight Issues:  Still on IV Dilaudid and Precedex. Working towards extubation - critical care assisting in mgmt.   Objective:  Vital signs for last 24 hours: Temp:  [86.7 F (30.4 C)-101.5 F (  38.6 C)] 98.1 F (36.7 C) (01/16 0845) Pulse Rate:  [77-178] 84 (01/16 0845) Resp:  [11-24] 13 (01/16 0845) BP: (62-131)/(32-88) 97/57 (01/16 0845) SpO2:  [95 %-100 %] 95 % (01/16 0845) FiO2 (%):  [30 %] 30 % (01/16 0817) Weight:  [51.7 kg (113 lb 15.7 oz)] 51.7 kg (113 lb 15.7 oz) (01/15 1645)  Hemodynamic parameters for last 24 hours: CVP:  [5 mmHg-19 mmHg] 6 mmHg  Intake/Output from previous day: 01/15 0701 - 01/16 0700 In: 7990.2 [P.O.:1533; I.V.:3377.2; Blood:630; NG/GT:1200; IV Piggyback:1250] Out: 8400 [Urine:8400]  Intake/Output this shift: Total I/O In: 266.8 [I.V.:166.8; NG/GT:100] Out: 305 [Urine:305]  Vent settings for last 24 hours: Vent Mode: PSV;CPAP FiO2 (%):  [30 %] 30 % Set Rate:   [12 bmp-15 bmp] 12 bmp Vt Set:  [12 mL] 12 mL PEEP:  [5 cmH20] 5 cmH20 Pressure Support:  [5 cmH20] 5 cmH20 Plateau Pressure:  [13 cmH20-16 cmH20] 15 cmH20  Physical Exam:  General: no respiratory distress and weaning as tolerated for potential extubation Neuro: nonfocal exam and RASS -1 Resp: clear to auscultation bilaterally and . CVS: regular rate and rhythm, S1, S2 normal, no murmur, click, rub or gallop GI: soft, nontender, BS WNL, no r/g and soft, tolerating tube feedings, has very good bowel sounds. Extremities: edema 2+ and all surgery on her extremities has been done.  Results for orders placed or performed during the hospital encounter of 05/30/17 (from the past 24 hour(s))  I-STAT 3, arterial blood gas (G3+)     Status: Abnormal   Collection Time: 06/16/17  9:14 AM  Result Value Ref Range   pH, Arterial 7.411 7.350 - 7.450   pCO2 arterial 38.7 32.0 - 48.0 mmHg   pO2, Arterial 129.0 (H) 83.0 - 108.0 mmHg   Bicarbonate 24.5 20.0 - 28.0 mmol/L   TCO2 26 22 - 32 mmol/L   O2 Saturation 99.0 %   Patient temperature 99.1 F    Collection site RADIAL, ALLEN'S TEST ACCEPTABLE    Drawn by RT    Sample type ARTERIAL   Glucose, capillary     Status: None   Collection Time: 06/16/17 12:15 PM  Result Value Ref Range   Glucose-Capillary 95 65 - 99 mg/dL  CBC     Status: Abnormal   Collection Time: 06/16/17  1:33 PM  Result Value Ref Range   WBC 16.5 (H) 4.0 - 10.5 K/uL   RBC 3.56 (L) 3.87 - 5.11 MIL/uL   Hemoglobin 10.3 (L) 12.0 - 15.0 g/dL   HCT 69.630.8 (L) 29.536.0 - 28.446.0 %   MCV 86.5 78.0 - 100.0 fL   MCH 28.9 26.0 - 34.0 pg   MCHC 33.4 30.0 - 36.0 g/dL   RDW 13.214.2 44.011.5 - 10.215.5 %   Platelets 498 (H) 150 - 400 K/uL  Glucose, capillary     Status: Abnormal   Collection Time: 06/16/17  3:41 PM  Result Value Ref Range   Glucose-Capillary 129 (H) 65 - 99 mg/dL   Comment 1 Notify RN    Comment 2 Document in Chart   Glucose, capillary     Status: Abnormal   Collection Time: 06/16/17   8:40 PM  Result Value Ref Range   Glucose-Capillary 129 (H) 65 - 99 mg/dL  Glucose, capillary     Status: Abnormal   Collection Time: 06/16/17 11:59 PM  Result Value Ref Range   Glucose-Capillary 116 (H) 65 - 99 mg/dL  Glucose, capillary     Status: Abnormal  Collection Time: 06/17/17  4:04 AM  Result Value Ref Range   Glucose-Capillary 122 (H) 65 - 99 mg/dL  CBC with Differential/Platelet     Status: Abnormal   Collection Time: 06/17/17  5:20 AM  Result Value Ref Range   WBC 10.9 (H) 4.0 - 10.5 K/uL   RBC 3.37 (L) 3.87 - 5.11 MIL/uL   Hemoglobin 9.8 (L) 12.0 - 15.0 g/dL   HCT 16.1 (L) 09.6 - 04.5 %   MCV 88.1 78.0 - 100.0 fL   MCH 29.1 26.0 - 34.0 pg   MCHC 33.0 30.0 - 36.0 g/dL   RDW 40.9 81.1 - 91.4 %   Platelets 526 (H) 150 - 400 K/uL   Neutrophils Relative % 65 %   Neutro Abs 7.2 1.7 - 7.7 K/uL   Lymphocytes Relative 22 %   Lymphs Abs 2.4 0.7 - 4.0 K/uL   Monocytes Relative 11 %   Monocytes Absolute 1.2 (H) 0.1 - 1.0 K/uL   Eosinophils Relative 1 %   Eosinophils Absolute 0.1 0.0 - 0.7 K/uL   Basophils Relative 1 %   Basophils Absolute 0.1 0.0 - 0.1 K/uL  Basic metabolic panel     Status: Abnormal   Collection Time: 06/17/17  5:20 AM  Result Value Ref Range   Sodium 142 135 - 145 mmol/L   Potassium 3.8 3.5 - 5.1 mmol/L   Chloride 109 101 - 111 mmol/L   CO2 23 22 - 32 mmol/L   Glucose, Bld 156 (H) 65 - 99 mg/dL   BUN 12 6 - 20 mg/dL   Creatinine, Ser <7.82 (L) 0.44 - 1.00 mg/dL   Calcium 9.8 8.9 - 95.6 mg/dL   GFR calc non Af Amer NOT CALCULATED >60 mL/min   GFR calc Af Amer NOT CALCULATED >60 mL/min   Anion gap 10 5 - 15  Glucose, capillary     Status: Abnormal   Collection Time: 06/17/17  8:28 AM  Result Value Ref Range   Glucose-Capillary 120 (H) 65 - 99 mg/dL   Comment 1 Notify RN    Comment 2 Document in Chart      Assessment/Plan:   NEURO  Altered Mental Status:  sedation   Plan: Wean sedation for possible extubation in the next 24-48 hours.   PULM  Atelectasis/collapse (focal)   Plan: VDRF, weaning well this morning as we back off on the sedation.  CARDIO  No specific issues   Plan: CPM - on low dose phenylephrine - 57mcg/min from 160 yesterday - working towards extubation and hopefully this will be able to be weaned off  RENAL  Urine output and renal function are good.   Plan: UOP excellent; has been given lasix for hypervolemia as well  GI  NO specific issues   Plan: CPM.  Continue tube feedings.  ID  Pneumonia (hospital acquired (not ventilator-associated) being treated with the appropriate antibiotics.) Bacteremia (gram negative rod)   Plan: CPM. ID following, appreciate assistance.  Planning to remove central line today - right subclavian per ID; assuming unable to extubate, planning new line on contralateral side for access and continued vasopressor support which will be necessary if unable to extubate today.  HEME  Anemia acute blood loss anemia)   Plan: Getting two units of blood  ENDO No specific issues   Plan: CPM  Global Issues  Patient starting to wean and doing okay for now.  BP not as unstable with the additional volume as her CVP is up to 14.  As we awaken her we may be able to come off of some of the pressors and other sedatives and drips. Possible extubation later today - critical care following and assisting in care    LOS: 18 days   Additional comments:I reviewed the patient's new clinical lab test results. cbc/bmet and I reviewed the patients new imaging test results. cxr  Critical Care Total Time*: 30 Minutes  Andria Meuse 06/17/2017  *Care during the described time interval was provided by me and/or other providers on the critical care team.  I have reviewed this patient's available data, including medical history, events of note, physical examination and test results as part of my evaluation.
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
## 2017-06-17 NOTE — Consult Note (Signed)
WOC Nurse wound consult note Reason for Consult: left arm laceration, staff concerned about healing Wound type: full thickness laceration, repaired at the time of injury, sutures pulled and wound had skin separation Pressure Injury POA: NA Measurement: 4cm x 3cm x 0.1cm, small little dip in wound bed, does not probe or tunnel  Wound bed:95% hyper granulated, pink, / 5% yellow slough at distal wound edge Drainage (amount, consistency, odor) minimal, serosanguinous  Periwound: intact  Dressing procedure/placement/frequency: Continue xeroform, serves as non adherent, antibacterial. Change daily.   Discussed POC with patient and bedside nurse.  Re consult if needed, will not follow at this time. Thanks  Claudett Bayly [employer] MSN, RN,CWOCN, CNS, CWON-AP [phone])

## 2017-06-18 ENCOUNTER — Inpatient Hospital Stay (HOSPITAL_COMMUNITY): Payer: No Typology Code available for payment source

## 2017-06-18 DIAGNOSIS — S92101A Unspecified fracture of right talus, initial encounter for closed fracture: Secondary | ICD-10-CM

## 2017-06-18 DIAGNOSIS — S069X9A Unspecified intracranial injury with loss of consciousness of unspecified duration, initial encounter: Secondary | ICD-10-CM

## 2017-06-18 DIAGNOSIS — S32120A Nondisplaced Zone II fracture of sacrum, initial encounter for closed fracture: Secondary | ICD-10-CM

## 2017-06-18 DIAGNOSIS — S82251C Displaced comminuted fracture of shaft of right tibia, initial encounter for open fracture type IIIA, IIIB, or IIIC: Principal | ICD-10-CM

## 2017-06-18 LAB — BASIC METABOLIC PANEL
ANION GAP: 12 (ref 5–15)
BUN: 15 mg/dL (ref 6–20)
CHLORIDE: 102 mmol/L (ref 101–111)
CO2: 23 mmol/L (ref 22–32)
Calcium: 9.4 mg/dL (ref 8.9–10.3)
Creatinine, Ser: 0.4 mg/dL — ABNORMAL LOW (ref 0.44–1.00)
Glucose, Bld: 99 mg/dL (ref 65–99)
POTASSIUM: 3.9 mmol/L (ref 3.5–5.1)
Sodium: 137 mmol/L (ref 135–145)

## 2017-06-18 LAB — CBC
HEMATOCRIT: 31.1 % — AB (ref 36.0–46.0)
Hemoglobin: 10.2 g/dL — ABNORMAL LOW (ref 12.0–15.0)
MCH: 29.5 pg (ref 26.0–34.0)
MCHC: 32.8 g/dL (ref 30.0–36.0)
MCV: 89.9 fL (ref 78.0–100.0)
Platelets: 551 10*3/uL — ABNORMAL HIGH (ref 150–400)
RBC: 3.46 MIL/uL — AB (ref 3.87–5.11)
RDW: 14.9 % (ref 11.5–15.5)
WBC: 13.8 10*3/uL — AB (ref 4.0–10.5)

## 2017-06-18 LAB — GLUCOSE, CAPILLARY
GLUCOSE-CAPILLARY: 80 mg/dL (ref 65–99)
GLUCOSE-CAPILLARY: 82 mg/dL (ref 65–99)
Glucose-Capillary: 82 mg/dL (ref 65–99)
Glucose-Capillary: 89 mg/dL (ref 65–99)
Glucose-Capillary: 74 mg/dL (ref 65–99)

## 2017-06-18 MED ORDER — CHLORHEXIDINE GLUCONATE CLOTH 2 % EX PADS
6.0000 | MEDICATED_PAD | Freq: Every day | CUTANEOUS | Status: DC
  Administered 2017-06-18 – 2017-06-19 (×2): 6 via TOPICAL
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
## 2017-06-18 NOTE — Progress Notes (Signed)
I will follow up with pt and family tomorrow to begin discussions of an inpt acute rehab admission. [phone]
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
## 2017-06-18 NOTE — Progress Notes (Signed)
[facility]   Reason for visit: Follow up on pneumonia  Interval History: extubated yesterday and remains extubated; Tmax 100.9; blood cultures 1/13 remain ngtd;  CXR with no new concerns; WBC stable at 13.8 Day 7 levaquin Day 20 total antibiotics  Physical Exam: Constitutional:  Vitals:   06/18/17 0930 06/18/17 1000  BP: 116/82 116/63  Pulse: (!) 131 (!) 147  Resp: 15 13  Temp: 99.7 F (37.6 C)   SpO2: 99% 98%   patient appears in NAD, in bed Eyes: anicteric Respiratory: Normal respiratory effort; CTA B Cardiovascular: Tachy RR GI: soft, nt, nd  Review of Systems: Constitutional: negative for fevers and chills Gastrointestinal: negative for diarrhea  Lab Results  Component Value Date   WBC 13.8 (H) 06/18/2017   HGB 10.2 (L) 06/18/2017   HCT 31.1 (L) 06/18/2017   MCV 89.9 06/18/2017   PLT 551 (H) 06/18/2017    Lab Results  Component Value Date   CREATININE 0.40 (L) 06/18/2017   BUN 15 06/18/2017   NA 137 06/18/2017   K 3.9 06/18/2017   CL 102 06/18/2017   CO2 23 06/18/2017    Lab Results  Component Value Date   ALT 66 (H) 06/15/2017   AST 74 (H) 06/15/2017   ALKPHOS 228 (H) 06/15/2017     Microbiology: Recent Results (from the past 240 hour(s))  Culture, blood (routine x 2)     Status: Abnormal   Collection Time: 06/09/17  1:20 PM  Result Value Ref Range Status   Specimen Description BLOOD RIGHT HAND  Final   Special Requests IN PEDIATRIC BOTTLE Blood Culture adequate volume  Final   Culture  Setup Time   Final    GRAM NEGATIVE RODS IN PEDIATRIC BOTTLE CRITICAL RESULT CALLED TO, READ BACK BY AND VERIFIED WITH: A MASTERS PHARMD 0415 06/10/17 A BROWNING    Culture SERRATIA MARCESCENS (A)  Final   Report Status 06/12/2017 FINAL  Final   Organism ID, Bacteria SERRATIA MARCESCENS  Final      Susceptibility   Serratia marcescens - MIC*    CEFAZOLIN >=64 RESISTANT Resistant     CEFEPIME <=1 SENSITIVE Sensitive    CEFTAZIDIME <=1 SENSITIVE Sensitive     CEFTRIAXONE <=1 SENSITIVE Sensitive     CIPROFLOXACIN <=0.25 SENSITIVE Sensitive     GENTAMICIN <=1 SENSITIVE Sensitive     TRIMETH/SULFA <=20 SENSITIVE Sensitive     * SERRATIA MARCESCENS  Blood Culture ID Panel (Reflexed)     Status: Abnormal   Collection Time: 06/09/17  1:20 PM  Result Value Ref Range Status   Enterococcus species NOT DETECTED NOT DETECTED Final   Listeria monocytogenes NOT DETECTED NOT DETECTED Final   Staphylococcus species NOT DETECTED NOT DETECTED Final   Staphylococcus aureus NOT DETECTED NOT DETECTED Final   Streptococcus species NOT DETECTED NOT DETECTED Final   Streptococcus agalactiae NOT DETECTED NOT DETECTED Final   Streptococcus pneumoniae NOT DETECTED NOT DETECTED Final   Streptococcus pyogenes NOT DETECTED NOT DETECTED Final   Acinetobacter baumannii NOT DETECTED NOT DETECTED Final   Enterobacteriaceae species DETECTED (A) NOT DETECTED Final    Comment: Enterobacteriaceae represent a large family of gram-negative bacteria, not a single organism. CRITICAL RESULT CALLED TO, READ BACK BY AND VERIFIED WITH: A MASTERS PHARMD 0415 06/10/17 A BROWNING    Enterobacter cloacae complex NOT DETECTED NOT DETECTED Final   Escherichia coli NOT DETECTED NOT DETECTED Final   Klebsiella oxytoca NOT DETECTED NOT DETECTED Final   Klebsiella  pneumoniae NOT DETECTED NOT DETECTED Final   Proteus species NOT DETECTED NOT DETECTED Final   Serratia marcescens DETECTED (A) NOT DETECTED Final    Comment: CRITICAL RESULT CALLED TO, READ BACK BY AND VERIFIED WITH: A MASTERS PHARMD 0415 06/10/17 A BROWNING    Carbapenem resistance NOT DETECTED NOT DETECTED Final   Haemophilus influenzae NOT DETECTED NOT DETECTED Final   Neisseria meningitidis NOT DETECTED NOT DETECTED Final   Pseudomonas aeruginosa NOT DETECTED NOT DETECTED Final   Candida albicans NOT DETECTED NOT DETECTED Final   Candida glabrata NOT DETECTED NOT DETECTED Final   Candida  krusei NOT DETECTED NOT DETECTED Final   Candida parapsilosis NOT DETECTED NOT DETECTED Final   Candida tropicalis NOT DETECTED NOT DETECTED Final  Culture, blood (routine x 2)     Status: Abnormal   Collection Time: 06/09/17  1:27 PM  Result Value Ref Range Status   Specimen Description BLOOD RIGHT HAND  Final   Special Requests IN PEDIATRIC BOTTLE Blood Culture adequate volume  Final   Culture  Setup Time   Final    GRAM NEGATIVE RODS IN PEDIATRIC BOTTLE CRITICAL RESULT CALLED TO, READ BACK BY AND VERIFIED WITH: A MASTERS PHARMD 0415 06/10/17 A BROWNING    Culture (A)  Final    SERRATIA MARCESCENS SUSCEPTIBILITIES PERFORMED ON PREVIOUS CULTURE WITHIN THE LAST 5 DAYS.    Report Status 06/12/2017 FINAL  Final  Culture, Urine     Status: None   Collection Time: 06/09/17  2:22 PM  Result Value Ref Range Status   Specimen Description URINE, CATHETERIZED  Final   Special Requests Normal  Final   Culture NO GROWTH  Final   Report Status 06/10/2017 FINAL  Final  Culture, respiratory (NON-Expectorated)     Status: None   Collection Time: 06/09/17  2:36 PM  Result Value Ref Range Status   Specimen Description TRACHEAL ASPIRATE  Final   Special Requests Normal  Final   Gram Stain   Final    FEW WBC PRESENT, PREDOMINANTLY PMN RARE GRAM NEGATIVE RODS RARE GRAM POSITIVE COCCI IN CLUSTERS    Culture   Final    MODERATE PSEUDOMONAS AERUGINOSA MODERATE STENOTROPHOMONAS MALTOPHILIA    Report Status 06/13/2017 FINAL  Final   Organism ID, Bacteria PSEUDOMONAS AERUGINOSA  Final   Organism ID, Bacteria STENOTROPHOMONAS MALTOPHILIA  Final      Susceptibility   Pseudomonas aeruginosa - MIC*    CEFTAZIDIME 4 SENSITIVE Sensitive     CIPROFLOXACIN <=0.25 SENSITIVE Sensitive     GENTAMICIN <=1 SENSITIVE Sensitive     IMIPENEM <=0.25 SENSITIVE Sensitive     PIP/TAZO 8 SENSITIVE Sensitive     CEFEPIME 2 SENSITIVE Sensitive     * MODERATE PSEUDOMONAS AERUGINOSA   Stenotrophomonas maltophilia -  MIC*    LEVOFLOXACIN 1 SENSITIVE Sensitive     TRIMETH/SULFA <=20 SENSITIVE Sensitive     * MODERATE STENOTROPHOMONAS MALTOPHILIA  Culture, blood (Routine X 2) w Reflex to ID Panel     Status: None (Preliminary result)   Collection Time: 06/14/17  2:07 PM  Result Value Ref Range Status   Specimen Description BLOOD LEFT HAND  Final   Special Requests IN PEDIATRIC BOTTLE Blood Culture adequate volume  Final   Culture NO GROWTH 4 DAYS  Final   Report Status PENDING  Incomplete  Culture, blood (Routine X 2) w Reflex to ID Panel     Status: None (Preliminary result)   Collection Time: 06/14/17  2:22 PM  Result Value  Ref Range Status   Specimen Description BLOOD LEFT ANTECUBITAL  Final   Special Requests   Final    BOTTLES DRAWN AEROBIC ONLY Blood Culture adequate volume   Culture NO GROWTH 4 DAYS  Final   Report Status PENDING  Incomplete  Culture, Urine     Status: None   Collection Time: 06/15/17 12:50 PM  Result Value Ref Range Status   Specimen Description URINE, CATHETERIZED  Final   Special Requests NONE  Final   Culture NO GROWTH  Final   Report Status 06/16/2017 FINAL  Final    Impression/Plan:  1. Pneumonia - Stenotrophomonas and Pseudomonas in culture.  On levaquin and will continue.  If stable still tomorrow and afebrile, will consider stopping then.    2. Fever - likely from above.  Could be from lines, now out and only with peripheral IV.  Will monitor.   3.  Respiratory failure - doing well post extubation.
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
## 2017-06-18 NOTE — Progress Notes (Signed)
Trauma Service Note  Subjective: Patient is remaining extubated, but has not passed a swallowing evaluation, and the plan Is for FEES tomorrow.  Says she has no neck pain.Marland Kitchen.  Neurologically intact  Objective: Vital signs in last 24 hours: Temp:  [86.4 F (30.2 C)-101.7 F (38.7 C)] 99.7 F (37.6 C) (01/17 0930) Pulse Rate:  [111-174] 147 (01/17 1000) Resp:  [11-25] 13 (01/17 1000) BP: (83-131)/(45-86) 116/63 (01/17 1000) SpO2:  [93 %-100 %] 97 % (01/17 1119) FiO2 (%):  [35 %] 35 % (01/16 1149) Weight:  [48.4 kg (106 lb 11.2 oz)] 48.4 kg (106 lb 11.2 oz) (01/17 0500) Last BM Date: 06/17/17  Intake/Output from previous day: 01/16 0701 - 01/17 0700 In: 2554.8 [I.V.:2154.8; NG/GT:100; IV Piggyback:300] Out: 3600 [Urine:3500; Drains:100] Intake/Output this shift: Total I/O In: 206.7 [I.V.:206.7] Out: -   General: No changes  Lungs: Clear  Abd: Soft, good bowel sounds.  Extremities: No changes  Neuro: Seems to be intact  Cardiac:  Remain tachycardic.  May need some enteral pain and sedating  Medications.  Lab Results: CBC  Recent Labs    06/17/17 0520 06/18/17 0749  WBC 10.9* 13.8*  HGB 9.8* 10.2*  HCT 29.7* 31.1*  PLT 526* 551*   BMET Recent Labs    06/17/17 0520 06/18/17 0749  NA 142 137  K 3.8 3.9  CL 109 102  CO2 23 23  GLUCOSE 156* 99  BUN 12 15  CREATININE <0.30* 0.40*  CALCIUM 9.8 9.4   PT/INR No results for input(s): LABPROT, INR in the last 72 hours. ABG Recent Labs    06/16/17 0914  PHART 7.411  HCO3 24.5    Studies/Results: Dg Chest Port 1 View  Result Date: 06/18/2017 CLINICAL DATA:  Respiratory distress. EXAM: PORTABLE CHEST 1 VIEW COMPARISON:  Radiograph of June 17, 2017. FINDINGS: The heart size and mediastinal contours are within normal limits. Endotracheal and nasogastric tubes have been removed. No pneumothorax or pleural effusion is noted. Stable mild bibasilar subsegmental atelectasis is noted. The visualized skeletal  structures are unremarkable. IMPRESSION: Endotracheal and nasogastric tubes have been removed. Stable bibasilar subsegmental atelectasis. Electronically Signed   By: Lupita RaiderJames  Green Jr, M.D.   On: 06/18/2017 10:18   Dg Chest Port 1 View  Result Date: 06/17/2017 CLINICAL DATA:  Chest trauma. EXAM: PORTABLE CHEST 1 VIEW COMPARISON:  One-view chest x-ray 06/16/2016 FINDINGS: The heart size is normal. Endotracheal tube, right IJ line, and NG tube are stable. Mild bibasilar airspace disease is unchanged. Left rib scapular fractures are again noted. There is no pneumothorax. IMPRESSION: 1. No significant interval change and low lung volumes and bibasilar airspace disease, likely atelectasis. 2. The support apparatus is stable. Electronically Signed   By: Marin Robertshristopher  Mattern M.D.   On: 06/17/2017 08:37   Dg Abd Portable 1v  Result Date: 06/16/2017 CLINICAL DATA:  Abdominal pain EXAM: PORTABLE ABDOMEN - 1 VIEW COMPARISON:  Abdominal radiograph of June 15, 2017 FINDINGS: There remains a large amount of contrast laden stool throughout the colon and rectum. There has been only slight distal transit of the stool since yesterday's study. The nasogastric tube tip in proximal port lie in the region of the gastric body. The patient undergone screw fixation across the SI joints and the superior pubic ramus. IMPRESSION: Slight further distal migration of contrast laden stool from the large bowel into the rectum. No definite obstructive pattern. Electronically Signed   By: David  SwazilandJordan M.D.   On: 06/16/2017 14:09    Anti-infectives: Anti-infectives (  From admission, onward)   Start     Dose/Rate Route Frequency Ordered Stop   06/15/17 1534  tobramycin (NEBCIN) powder  Status:  Discontinued       As needed 06/15/17 1535 06/15/17 1622   06/15/17 1533  vancomycin (VANCOCIN) powder  Status:  Discontinued       As needed 06/15/17 1533 06/15/17 1622   06/12/17 1530  ceFEPIme (MAXIPIME) 1 g in dextrose 5 % 50 mL IVPB   Status:  Discontinued     1 g 100 mL/hr over 30 Minutes Intravenous Every 8 hours 06/12/17 1444 06/15/17 1410   06/12/17 1400  levofloxacin (LEVAQUIN) IVPB 750 mg     750 mg 100 mL/hr over 90 Minutes Intravenous Every 24 hours 06/12/17 1315     06/10/17 1200  cefTRIAXone (ROCEPHIN) 2 g in dextrose 5 % 50 mL IVPB  Status:  Discontinued     2 g 100 mL/hr over 30 Minutes Intravenous Every 24 hours 06/10/17 1135 06/12/17 1437   06/10/17 0500  gentamicin (GARAMYCIN) 400 mg in dextrose 5 % 100 mL IVPB     400 mg 110 mL/hr over 60 Minutes Intravenous  Once 06/10/17 0436 06/10/17 0550   06/08/17 1243  vancomycin (VANCOCIN) powder  Status:  Discontinued       As needed 06/08/17 1243 06/08/17 1349   06/07/17 1000  vancomycin (VANCOCIN) 1,250 mg in sodium chloride 0.9 % 250 mL IVPB  Status:  Discontinued     1,250 mg 166.7 mL/hr over 90 Minutes Intravenous Every 8 hours 06/07/17 0804 06/12/17 1307   06/05/17 1700  vancomycin (VANCOCIN) 1,250 mg in sodium chloride 0.9 % 250 mL IVPB  Status:  Discontinued     1,250 mg 166.7 mL/hr over 90 Minutes Intravenous Every 8 hours 06/05/17 1625 06/07/17 0746   06/05/17 0830  piperacillin-tazobactam (ZOSYN) IVPB 3.375 g  Status:  Discontinued     3.375 g 12.5 mL/hr over 240 Minutes Intravenous Every 8 hours 06/05/17 0803 06/10/17 0933   06/03/17 1600  vancomycin (VANCOCIN) IVPB 1000 mg/200 mL premix  Status:  Discontinued     1,000 mg 200 mL/hr over 60 Minutes Intravenous Every 12 hours 06/03/17 1417 06/05/17 1625   06/03/17 1226  tobramycin (NEBCIN) powder  Status:  Discontinued       As needed 06/03/17 1227 06/03/17 1326   06/03/17 1225  vancomycin (VANCOCIN) powder  Status:  Discontinued       As needed 06/03/17 1226 06/03/17 1326   05/30/17 0930  ceFAZolin (ANCEF) IVPB 1 g/50 mL premix  Status:  Discontinued     1 g 100 mL/hr over 30 Minutes Intravenous Every 8 hours 05/30/17 0917 06/03/17 1417   05/30/17 0600  ceFAZolin (ANCEF) IVPB 1 g/50 mL premix      1 g 100 mL/hr over 30 Minutes Intravenous  Once 05/30/17 0556 05/30/17 0742      Assessment/Plan: s/p Procedure(s): OPEN REDUCTION INTERNAL FIXATION (ORIF) RIGHT ANKLE FRACTURE Cortrak and MRI C-spine  LOS: 19 days   Marta Lamas. Gae Bon, MD, FACS [phone] Trauma Surgeon 06/18/2017

## 2017-06-18 NOTE — Progress Notes (Signed)
Inpatient Rehabilitation  Per PT request, patient was screened by Mearl Harewood for appropriateness for an Inpatient Acute Rehab consult.  At this time we are recommending an Inpatient Rehab consult.  Text paged medical team to notify.  Please order if you are agreeable.    Monnica Saltsman, M.A., CCC/SLP Admission Coordinator  [facility]  Cell [phone]  

## 2017-06-18 NOTE — Progress Notes (Signed)
Cervical MRI reviewed No ligamentous or bony injury No need for cervical collar

## 2017-06-19 ENCOUNTER — Inpatient Hospital Stay (HOSPITAL_COMMUNITY): Payer: No Typology Code available for payment source

## 2017-06-19 LAB — CULTURE, BLOOD (ROUTINE X 2)
CULTURE: NO GROWTH
CULTURE: NO GROWTH
SPECIAL REQUESTS: ADEQUATE
SPECIAL REQUESTS: ADEQUATE

## 2017-06-19 LAB — GLUCOSE, CAPILLARY
GLUCOSE-CAPILLARY: 81 mg/dL (ref 65–99)
GLUCOSE-CAPILLARY: 88 mg/dL (ref 65–99)
Glucose-Capillary: 114 mg/dL — ABNORMAL HIGH (ref 65–99)
Glucose-Capillary: 75 mg/dL (ref 65–99)
Glucose-Capillary: 87 mg/dL (ref 65–99)
Glucose-Capillary: 88 mg/dL (ref 65–99)
Glucose-Capillary: 90 mg/dL (ref 65–99)

## 2017-06-19 MED ORDER — CLONAZEPAM 1 MG PO TABS
1.0000 mg | ORAL_TABLET | Freq: Two times a day (BID) | ORAL | Status: DC
  Filled 2017-06-19: qty 1

## 2017-06-19 MED ORDER — QUETIAPINE FUMARATE 50 MG PO TABS
50.0000 mg | ORAL_TABLET | Freq: Three times a day (TID) | ORAL | Status: DC
  Administered 2017-06-19 – 2017-06-21 (×7): 50 mg via ORAL
  Filled 2017-06-19 (×6): qty 2

## 2017-06-19 MED ORDER — METHADONE HCL 10 MG PO TABS
20.0000 mg | ORAL_TABLET | Freq: Three times a day (TID) | ORAL | Status: DC
  Administered 2017-06-19 – 2017-06-21 (×7): 20 mg via ORAL
  Filled 2017-06-19 (×7): qty 2

## 2017-06-19 MED ORDER — HYDROCODONE-ACETAMINOPHEN 7.5-325 MG/15ML PO SOLN: 10.0000 mL | ORAL | Status: DC | PRN

## 2017-06-19 MED ORDER — RESOURCE THICKENUP CLEAR PO POWD
ORAL | Status: DC | PRN
  Filled 2017-06-19 (×2): qty 125

## 2017-06-19 MED ORDER — QUETIAPINE FUMARATE 25 MG PO TABS
50.0000 mg | ORAL_TABLET | Freq: Three times a day (TID) | ORAL | Status: DC
  Filled 2017-06-19: qty 2

## 2017-06-19 MED ORDER — METHADONE HCL 10 MG/ML PO CONC: 20.0000 mg | Freq: Three times a day (TID) | ORAL | Status: DC

## 2017-06-19 MED ORDER — METOPROLOL TARTRATE 25 MG/10 ML ORAL SUSPENSION
12.5000 mg | Freq: Two times a day (BID) | ORAL | Status: DC
  Administered 2017-06-19 (×2): 12.5 mg via ORAL
  Filled 2017-06-19: qty 10

## 2017-06-19 MED ORDER — METOPROLOL TARTRATE 25 MG/10 ML ORAL SUSPENSION
12.5000 mg | Freq: Two times a day (BID) | ORAL | Status: DC
  Filled 2017-06-19: qty 10

## 2017-06-19 MED ORDER — HYDROCODONE-ACETAMINOPHEN 7.5-325 MG/15ML PO SOLN
10.0000 mL | ORAL | Status: DC | PRN
  Administered 2017-06-19: 10 mL via ORAL
  Filled 2017-06-19 (×2): qty 15

## 2017-06-19 MED ORDER — CLONAZEPAM 1 MG PO TABS
1.0000 mg | ORAL_TABLET | Freq: Two times a day (BID) | ORAL | Status: DC
  Administered 2017-06-19 – 2017-06-21 (×5): 1 mg via ORAL
  Filled 2017-06-19 (×4): qty 1

## 2017-06-19 MED ORDER — METHADONE HCL 10 MG PO TABS: 20.0000 mg | ORAL_TABLET | Freq: Three times a day (TID) | ORAL | Status: DC
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
## 2017-06-19 NOTE — Progress Notes (Signed)
I met with pt's sister, Verdene Lennert, at bedside to discuss goals and expectations of an inpt rehab admit. She is in agreement to admit when pt medically ready. Pt to d/c to her home for she will be primary caregiver 24/7. I will follow up on Monday. [phone]
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
## 2017-06-19 NOTE — Progress Notes (Signed)
[facility]   Reason for visit: Follow up on pneumonia  Interval History: Remains extubated; Tmax 100.4; blood cultures 1/13 remain ngtd;  Day 8 levaquin Day 21 total antibiotics  Physical Exam: Constitutional:  Vitals:   06/19/17 0836 06/19/17 0959  BP:  (!) 116/57  Pulse:  (!) 147  Resp:    Temp:    SpO2: 100%    patient appears in NAD, in chair Eyes: anicteric Respiratory: Normal respiratory effort; CTA B, not on O2 Cardiovascular: Tachy RR GI: soft, nt, nd MS: wrapped leg, closed incision  Review of Systems: Constitutional: negative for fevers and chills Gastrointestinal: negative for diarrhea  Lab Results  Component Value Date   WBC 13.8 (H) 06/18/2017   HGB 10.2 (L) 06/18/2017   HCT 31.1 (L) 06/18/2017   MCV 89.9 06/18/2017   PLT 551 (H) 06/18/2017    Lab Results  Component Value Date   CREATININE 0.40 (L) 06/18/2017   BUN 15 06/18/2017   NA 137 06/18/2017   K 3.9 06/18/2017   CL 102 06/18/2017   CO2 23 06/18/2017    Lab Results  Component Value Date   ALT 66 (H) 06/15/2017   AST 74 (H) 06/15/2017   ALKPHOS 228 (H) 06/15/2017     Microbiology: Recent Results (from the past 240 hour(s))  Culture, blood (routine x 2)     Status: Abnormal   Collection Time: 06/09/17  1:20 PM  Result Value Ref Range Status   Specimen Description BLOOD RIGHT HAND  Final   Special Requests IN PEDIATRIC BOTTLE Blood Culture adequate volume  Final   Culture  Setup Time   Final    GRAM NEGATIVE RODS IN PEDIATRIC BOTTLE CRITICAL RESULT CALLED TO, READ BACK BY AND VERIFIED WITH: A MASTERS PHARMD 0415 06/10/17 A BROWNING    Culture SERRATIA MARCESCENS (A)  Final   Report Status 06/12/2017 FINAL  Final   Organism ID, Bacteria SERRATIA MARCESCENS  Final      Susceptibility   Serratia marcescens - MIC*    CEFAZOLIN >=64 RESISTANT Resistant     CEFEPIME <=1 SENSITIVE Sensitive     CEFTAZIDIME <=1 SENSITIVE Sensitive     CEFTRIAXONE <=1  SENSITIVE Sensitive     CIPROFLOXACIN <=0.25 SENSITIVE Sensitive     GENTAMICIN <=1 SENSITIVE Sensitive     TRIMETH/SULFA <=20 SENSITIVE Sensitive     * SERRATIA MARCESCENS  Blood Culture ID Panel (Reflexed)     Status: Abnormal   Collection Time: 06/09/17  1:20 PM  Result Value Ref Range Status   Enterococcus species NOT DETECTED NOT DETECTED Final   Listeria monocytogenes NOT DETECTED NOT DETECTED Final   Staphylococcus species NOT DETECTED NOT DETECTED Final   Staphylococcus aureus NOT DETECTED NOT DETECTED Final   Streptococcus species NOT DETECTED NOT DETECTED Final   Streptococcus agalactiae NOT DETECTED NOT DETECTED Final   Streptococcus pneumoniae NOT DETECTED NOT DETECTED Final   Streptococcus pyogenes NOT DETECTED NOT DETECTED Final   Acinetobacter baumannii NOT DETECTED NOT DETECTED Final   Enterobacteriaceae species DETECTED (A) NOT DETECTED Final    Comment: Enterobacteriaceae represent a large family of gram-negative bacteria, not a single organism. CRITICAL RESULT CALLED TO, READ BACK BY AND VERIFIED WITH: A MASTERS PHARMD 0415 06/10/17 A BROWNING    Enterobacter cloacae complex NOT DETECTED NOT DETECTED Final   Escherichia coli NOT DETECTED NOT DETECTED Final   Klebsiella oxytoca NOT DETECTED NOT DETECTED Final   Klebsiella pneumoniae NOT DETECTED NOT DETECTED  Final   Proteus species NOT DETECTED NOT DETECTED Final   Serratia marcescens DETECTED (A) NOT DETECTED Final    Comment: CRITICAL RESULT CALLED TO, READ BACK BY AND VERIFIED WITH: A MASTERS PHARMD 0415 06/10/17 A BROWNING    Carbapenem resistance NOT DETECTED NOT DETECTED Final   Haemophilus influenzae NOT DETECTED NOT DETECTED Final   Neisseria meningitidis NOT DETECTED NOT DETECTED Final   Pseudomonas aeruginosa NOT DETECTED NOT DETECTED Final   Candida albicans NOT DETECTED NOT DETECTED Final   Candida glabrata NOT DETECTED NOT DETECTED Final   Candida krusei NOT DETECTED NOT DETECTED Final   Candida  parapsilosis NOT DETECTED NOT DETECTED Final   Candida tropicalis NOT DETECTED NOT DETECTED Final  Culture, blood (routine x 2)     Status: Abnormal   Collection Time: 06/09/17  1:27 PM  Result Value Ref Range Status   Specimen Description BLOOD RIGHT HAND  Final   Special Requests IN PEDIATRIC BOTTLE Blood Culture adequate volume  Final   Culture  Setup Time   Final    GRAM NEGATIVE RODS IN PEDIATRIC BOTTLE CRITICAL RESULT CALLED TO, READ BACK BY AND VERIFIED WITH: A MASTERS PHARMD 0415 06/10/17 A BROWNING    Culture (A)  Final    SERRATIA MARCESCENS SUSCEPTIBILITIES PERFORMED ON PREVIOUS CULTURE WITHIN THE LAST 5 DAYS.    Report Status 06/12/2017 FINAL  Final  Culture, Urine     Status: None   Collection Time: 06/09/17  2:22 PM  Result Value Ref Range Status   Specimen Description URINE, CATHETERIZED  Final   Special Requests Normal  Final   Culture NO GROWTH  Final   Report Status 06/10/2017 FINAL  Final  Culture, respiratory (NON-Expectorated)     Status: None   Collection Time: 06/09/17  2:36 PM  Result Value Ref Range Status   Specimen Description TRACHEAL ASPIRATE  Final   Special Requests Normal  Final   Gram Stain   Final    FEW WBC PRESENT, PREDOMINANTLY PMN RARE GRAM NEGATIVE RODS RARE GRAM POSITIVE COCCI IN CLUSTERS    Culture   Final    MODERATE PSEUDOMONAS AERUGINOSA MODERATE STENOTROPHOMONAS MALTOPHILIA    Report Status 06/13/2017 FINAL  Final   Organism ID, Bacteria PSEUDOMONAS AERUGINOSA  Final   Organism ID, Bacteria STENOTROPHOMONAS MALTOPHILIA  Final      Susceptibility   Pseudomonas aeruginosa - MIC*    CEFTAZIDIME 4 SENSITIVE Sensitive     CIPROFLOXACIN <=0.25 SENSITIVE Sensitive     GENTAMICIN <=1 SENSITIVE Sensitive     IMIPENEM <=0.25 SENSITIVE Sensitive     PIP/TAZO 8 SENSITIVE Sensitive     CEFEPIME 2 SENSITIVE Sensitive     * MODERATE PSEUDOMONAS AERUGINOSA   Stenotrophomonas maltophilia - MIC*    LEVOFLOXACIN 1 SENSITIVE Sensitive      TRIMETH/SULFA <=20 SENSITIVE Sensitive     * MODERATE STENOTROPHOMONAS MALTOPHILIA  Culture, blood (Routine X 2) w Reflex to ID Panel     Status: None   Collection Time: 06/14/17  2:07 PM  Result Value Ref Range Status   Specimen Description BLOOD LEFT HAND  Final   Special Requests IN PEDIATRIC BOTTLE Blood Culture adequate volume  Final   Culture NO GROWTH 5 DAYS  Final   Report Status 06/19/2017 FINAL  Final  Culture, blood (Routine X 2) w Reflex to ID Panel     Status: None   Collection Time: 06/14/17  2:22 PM  Result Value Ref Range Status   Specimen Description BLOOD  LEFT ANTECUBITAL  Final   Special Requests   Final    BOTTLES DRAWN AEROBIC ONLY Blood Culture adequate volume   Culture NO GROWTH 5 DAYS  Final   Report Status 06/19/2017 FINAL  Final  Culture, Urine     Status: None   Collection Time: 06/15/17 12:50 PM  Result Value Ref Range Status   Specimen Description URINE, CATHETERIZED  Final   Special Requests NONE  Final   Culture NO GROWTH  Final   Report Status 06/16/2017 FINAL  Final    Impression/Plan:  1. Pneumonia - Stenotrophomonas and Pseudomonas in culture.  On levaquin and has completed 8 days and patient is breathing well, no hypoxia, CXR improved. I will stop levaquin  2. Fever - Tmax 100.4.  No signs of infection at this time.    At this time, I will sign off.  thanks
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
## 2017-06-19 NOTE — Progress Notes (Signed)
FEES planned for 9 am today. Harlon DittyBonnie Marcelles Clinard, MA CCC-SLP [phone]

## 2017-06-20 LAB — BASIC METABOLIC PANEL
ANION GAP: 12 (ref 5–15)
BUN: 10 mg/dL (ref 6–20)
CALCIUM: 9.6 mg/dL (ref 8.9–10.3)
CHLORIDE: 101 mmol/L (ref 101–111)
CO2: 22 mmol/L (ref 22–32)
CREATININE: 0.39 mg/dL — AB (ref 0.44–1.00)
GFR calc non Af Amer: >60 mL/min (ref >60)
Glucose, Bld: 91 mg/dL (ref 65–99)
Potassium: 4.2 mmol/L (ref 3.5–5.1)
SODIUM: 135 mmol/L (ref 135–145)

## 2017-06-20 LAB — CBC WITH DIFFERENTIAL/PLATELET
BASOS PCT: 1 %
Basophils Absolute: 0.1 10*3/uL (ref 0.0–0.1)
EOS ABS: 0.2 10*3/uL (ref 0.0–0.7)
Eosinophils Relative: 2 %
HCT: 36.7 % (ref 36.0–46.0)
HEMOGLOBIN: 11.9 g/dL — AB (ref 12.0–15.0)
Lymphocytes Relative: 25 %
Lymphs Abs: 2.1 10*3/uL (ref 0.7–4.0)
MCH: 29.4 pg (ref 26.0–34.0)
MCHC: 32.4 g/dL (ref 30.0–36.0)
MCV: 90.6 fL (ref 78.0–100.0)
MONOS PCT: 11 %
Monocytes Absolute: 0.9 10*3/uL (ref 0.1–1.0)
NEUTROS PCT: 61 %
Neutro Abs: 5.3 10*3/uL (ref 1.7–7.7)
Platelets: 530 10*3/uL — ABNORMAL HIGH (ref 150–400)
RBC: 4.05 MIL/uL (ref 3.87–5.11)
RDW: 14.9 % (ref 11.5–15.5)
WBC: 8.5 10*3/uL (ref 4.0–10.5)

## 2017-06-20 LAB — GLUCOSE, CAPILLARY
GLUCOSE-CAPILLARY: 88 mg/dL (ref 65–99)
GLUCOSE-CAPILLARY: 110 mg/dL — AB (ref 65–99)
Glucose-Capillary: 78 mg/dL (ref 65–99)
Glucose-Capillary: 85 mg/dL (ref 65–99)
Glucose-Capillary: 87 mg/dL (ref 65–99)
Glucose-Capillary: 85 mg/dL (ref 65–99)

## 2017-06-20 MED ORDER — DOCUSATE SODIUM 100 MG PO CAPS
100.0000 mg | ORAL_CAPSULE | Freq: Two times a day (BID) | ORAL | Status: DC
  Administered 2017-06-20 – 2017-06-24 (×8): 100 mg via ORAL
  Filled 2017-06-20 (×9): qty 1

## 2017-06-20 MED ORDER — METOPROLOL TARTRATE 25 MG PO TABS
12.5000 mg | ORAL_TABLET | Freq: Two times a day (BID) | ORAL | Status: DC
  Administered 2017-06-20: 12.5 mg via ORAL
  Filled 2017-06-20: qty 1

## 2017-06-20 MED ORDER — OXYCODONE HCL 5 MG PO TABS
10.0000 mg | ORAL_TABLET | ORAL | Status: DC | PRN
  Administered 2017-06-20 – 2017-06-23 (×9): 10 mg via ORAL
  Filled 2017-06-20 (×9): qty 2

## 2017-06-20 MED ORDER — METOPROLOL TARTRATE 25 MG PO TABS
25.0000 mg | ORAL_TABLET | Freq: Two times a day (BID) | ORAL | Status: DC
  Administered 2017-06-20 – 2017-06-24 (×8): 25 mg via ORAL
  Filled 2017-06-20 (×8): qty 1
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
## 2017-06-21 LAB — GLUCOSE, CAPILLARY
GLUCOSE-CAPILLARY: 85 mg/dL (ref 65–99)
GLUCOSE-CAPILLARY: 93 mg/dL (ref 65–99)
GLUCOSE-CAPILLARY: 98 mg/dL (ref 65–99)

## 2017-06-21 MED ORDER — QUETIAPINE FUMARATE 100 MG PO TABS: 100.0000 mg | ORAL_TABLET | Freq: Every day | ORAL | Status: DC

## 2017-06-21 MED ORDER — CLONAZEPAM 0.5 MG PO TABS
0.5000 mg | ORAL_TABLET | Freq: Two times a day (BID) | ORAL | Status: DC
  Administered 2017-06-21: 0.5 mg via ORAL
  Filled 2017-06-21: qty 1

## 2017-06-21 MED ORDER — METHADONE HCL 5 MG PO TABS
15.0000 mg | ORAL_TABLET | Freq: Three times a day (TID) | ORAL | Status: DC
  Administered 2017-06-21 – 2017-06-22 (×2): 15 mg via ORAL
  Filled 2017-06-21 (×2): qty 1

## 2017-06-21 NOTE — Progress Notes (Signed)
Trauma Service Note  Subjective: Patient looks great.  Awake and alert.  Able to have a norma conversation.  Still tachycardic  Objective: Vital signs in last 24 hours: Temp:  [97.8 F (36.6 C)-99 F (37.2 C)] 98.3 F (36.8 C) (01/20 0800) Pulse Rate:  [71-140] 117 (01/20 0800) Resp:  [9-27] 15 (01/20 0800) BP: (93-122)/(42-107) 100/69 (01/20 0800) SpO2:  [92 %-100 %] 100 % (01/20 0800) Weight:  [44.3 kg (97 lb 10.6 oz)] 44.3 kg (97 lb 10.6 oz) (01/20 0600) Last BM Date: 06/20/17  Intake/Output from previous day: 01/19 0701 - 01/20 0700 In: 1294.8 [I.V.:1294.8] Out: 1300 [Urine:1300] Intake/Output this shift: Total I/O In: 50 [I.V.:50] Out: -   General: No distress.  Itching in her right leg is bothersome  Lungs: Clear to auscultation.  Oxygen saturations 99% on room air  Abd: Soft, benign.  Eating better.  Extremities: No changes  Neuro: Intact  Lab Results: CBC  Recent Labs    06/20/17 0427  WBC 8.5  HGB 11.9*  HCT 36.7  PLT 530*   BMET Recent Labs    06/20/17 0427  NA 135  K 4.2  CL 101  CO2 22  GLUCOSE 91  BUN 10  CREATININE 0.39*  CALCIUM 9.6   PT/INR No results for input(s): LABPROT, INR in the last 72 hours. ABG No results for input(s): PHART, HCO3 in the last 72 hours.  Invalid input(s): PCO2, PO2  Studies/Results: No results found.  Anti-infectives: Anti-infectives (From admission, onward)   Start     Dose/Rate Route Frequency Ordered Stop   06/15/17 1534  tobramycin (NEBCIN) powder  Status:  Discontinued       As needed 06/15/17 1535 06/15/17 1622   06/15/17 1533  vancomycin (VANCOCIN) powder  Status:  Discontinued       As needed 06/15/17 1533 06/15/17 1622   06/12/17 1530  ceFEPIme (MAXIPIME) 1 g in dextrose 5 % 50 mL IVPB  Status:  Discontinued     1 g 100 mL/hr over 30 Minutes Intravenous Every 8 hours 06/12/17 1444 06/15/17 1410   06/12/17 1400  levofloxacin (LEVAQUIN) IVPB 750 mg  Status:  Discontinued     750 mg 100  mL/hr over 90 Minutes Intravenous Every 24 hours 06/12/17 1315 06/19/17 1057   06/10/17 1200  cefTRIAXone (ROCEPHIN) 2 g in dextrose 5 % 50 mL IVPB  Status:  Discontinued     2 g 100 mL/hr over 30 Minutes Intravenous Every 24 hours 06/10/17 1135 06/12/17 1437   06/10/17 0500  gentamicin (GARAMYCIN) 400 mg in dextrose 5 % 100 mL IVPB     400 mg 110 mL/hr over 60 Minutes Intravenous  Once 06/10/17 0436 06/10/17 0550   06/08/17 1243  vancomycin (VANCOCIN) powder  Status:  Discontinued       As needed 06/08/17 1243 06/08/17 1349   06/07/17 1000  vancomycin (VANCOCIN) 1,250 mg in sodium chloride 0.9 % 250 mL IVPB  Status:  Discontinued     1,250 mg 166.7 mL/hr over 90 Minutes Intravenous Every 8 hours 06/07/17 0804 06/12/17 1307   06/05/17 1700  vancomycin (VANCOCIN) 1,250 mg in sodium chloride 0.9 % 250 mL IVPB  Status:  Discontinued     1,250 mg 166.7 mL/hr over 90 Minutes Intravenous Every 8 hours 06/05/17 1625 06/07/17 0746   06/05/17 0830  piperacillin-tazobactam (ZOSYN) IVPB 3.375 g  Status:  Discontinued     3.375 g 12.5 mL/hr over 240 Minutes Intravenous Every 8 hours 06/05/17 0803 06/10/17 0933  06/03/17 1600  vancomycin (VANCOCIN) IVPB 1000 mg/200 mL premix  Status:  Discontinued     1,000 mg 200 mL/hr over 60 Minutes Intravenous Every 12 hours 06/03/17 1417 06/05/17 1625   06/03/17 1226  tobramycin (NEBCIN) powder  Status:  Discontinued       As needed 06/03/17 1227 06/03/17 1326   06/03/17 1225  vancomycin (VANCOCIN) powder  Status:  Discontinued       As needed 06/03/17 1226 06/03/17 1326   05/30/17 0930  ceFAZolin (ANCEF) IVPB 1 g/50 mL premix  Status:  Discontinued     1 g 100 mL/hr over 30 Minutes Intravenous Every 8 hours 05/30/17 0917 06/03/17 1417   05/30/17 0600  ceFAZolin (ANCEF) IVPB 1 g/50 mL premix     1 g 100 mL/hr over 30 Minutes Intravenous  Once 05/30/17 0556 05/30/17 0742      Assessment/Plan: s/p Procedure(s): OPEN REDUCTION INTERNAL FIXATION (ORIF) RIGHT  ANKLE FRACTURE Advance diet Transfer to 4NP  LOS: 22 days   Marta LamasJames O. Gae BonWyatt, III, MD, FACS [phone] Trauma Surgeon 06/21/2017

## 2017-06-21 NOTE — Plan of Care (Addendum)
Patient alert & oriented x3 and able to communicate needs. Maintaining airway and oxygenation on room air. Tolerating po meds and nectar thick liquids per speech therapy recommendations. Foley discontinued on day shift and voiding with bedpan overnight.

## 2017-06-21 NOTE — Plan of Care (Signed)
  Clinical Measurements: Ability to maintain clinical measurements within normal limits will improve 06/21/2017 2022 - Progressing by Luther Redourgott, Phelan Schadt, RN   Pain Managment: General experience of comfort will improve 06/21/2017 2022 - Progressing by Luther Redourgott, Labella Zahradnik, RN

## 2017-06-21 NOTE — Progress Notes (Signed)
Report received from 4N ICU, bath given with ICU RN prior to tx. Patient settled into 4NP02, vitals stable, telemetry verified, oriented to room. Pt resting at this time.

## 2017-06-21 NOTE — Progress Notes (Signed)
50 ml Dilaudid gtt wasted, witnessed by [place] Allen, RN

## 2017-06-22 MED ORDER — CLONAZEPAM 0.5 MG PO TABS: 0.2500 mg | ORAL_TABLET | Freq: Two times a day (BID) | ORAL | Status: DC

## 2017-06-22 MED ORDER — TRAMADOL HCL 50 MG PO TABS
50.0000 mg | ORAL_TABLET | Freq: Four times a day (QID) | ORAL | Status: DC
  Administered 2017-06-22 – 2017-06-23 (×3): 50 mg via ORAL
  Filled 2017-06-22 (×3): qty 1

## 2017-06-22 MED ORDER — NYSTATIN 100000 UNIT/ML MT SUSP
5.0000 mL | Freq: Four times a day (QID) | OROMUCOSAL | Status: DC
  Administered 2017-06-22 – 2017-06-23 (×4): 500000 [IU] via ORAL
  Administered 2017-06-23: 5 mL via ORAL
  Administered 2017-06-23: 500000 [IU] via ORAL
  Administered 2017-06-23: 5 mL via ORAL
  Administered 2017-06-24 (×2): 500000 [IU] via ORAL
  Filled 2017-06-22 (×9): qty 5

## 2017-06-22 MED ORDER — QUETIAPINE FUMARATE 50 MG PO TABS
50.0000 mg | ORAL_TABLET | Freq: Every day | ORAL | Status: DC
  Administered 2017-06-22: 50 mg via ORAL
  Filled 2017-06-22: qty 1

## 2017-06-22 MED ORDER — METHADONE HCL 10 MG PO TABS
15.0000 mg | ORAL_TABLET | Freq: Two times a day (BID) | ORAL | Status: DC
  Administered 2017-06-22: 15 mg via ORAL
  Filled 2017-06-22 (×2): qty 1

## 2017-06-22 MED ORDER — ENSURE ENLIVE PO LIQD
237.0000 mL | Freq: Two times a day (BID) | ORAL | Status: DC
  Administered 2017-06-22 – 2017-06-24 (×3): 237 mL via ORAL

## 2017-06-22 MED ORDER — CLONAZEPAM 0.25 MG PO TBDP
0.2500 mg | ORAL_TABLET | Freq: Two times a day (BID) | ORAL | Status: DC
  Administered 2017-06-22 – 2017-06-23 (×2): 0.25 mg via ORAL
  Filled 2017-06-22 (×2): qty 1

## 2017-06-22 NOTE — Progress Notes (Signed)
I passed on to day RN that pt has white coated tongue and may have thrush, which I aske the day RN to follow up.
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
## 2017-06-22 NOTE — Progress Notes (Signed)
I await further progress with therapy tolerance to be able to admit pt to inpt rehab. [phone]
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
## 2017-06-23 ENCOUNTER — Inpatient Hospital Stay (HOSPITAL_COMMUNITY): Payer: No Typology Code available for payment source

## 2017-06-23 ENCOUNTER — Other Ambulatory Visit: Payer: Self-pay

## 2017-06-23 MED ORDER — OXYCODONE HCL 5 MG PO TABS
5.0000 mg | ORAL_TABLET | ORAL | Status: DC | PRN
  Administered 2017-06-23 – 2017-06-24 (×2): 5 mg via ORAL
  Filled 2017-06-23 (×2): qty 1

## 2017-06-23 MED ORDER — TRAMADOL HCL 50 MG PO TABS
50.0000 mg | ORAL_TABLET | Freq: Four times a day (QID) | ORAL | Status: DC | PRN
  Administered 2017-06-23: 50 mg via ORAL
  Filled 2017-06-23: qty 1

## 2017-06-23 MED ORDER — METHADONE HCL 5 MG PO TABS
5.0000 mg | ORAL_TABLET | Freq: Two times a day (BID) | ORAL | Status: DC
  Administered 2017-06-23 (×2): 5 mg via ORAL
  Filled 2017-06-23: qty 1

## 2017-06-23 MED ORDER — QUETIAPINE FUMARATE 50 MG PO TABS
25.0000 mg | ORAL_TABLET | Freq: Every day | ORAL | Status: DC
  Administered 2017-06-23: 25 mg via ORAL
  Filled 2017-06-23: qty 1
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
## 2017-06-23 NOTE — Progress Notes (Signed)
Orthopedic Tech Progress Note Patient Details:  Briana [name] 09/15/1995 [id number]  Ortho Devices Type of Ortho Device: CAM walker Ortho Device/Splint Location: RLE Ortho Device/Splint Interventions: Ordered   Post Interventions Patient Tolerated: Well Instructions Provided: Care of device   Jennye MoccasinHughes, Jah Alarid Craig 06/23/2017, 4:25 PM
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
## 2017-06-24 ENCOUNTER — Encounter (HOSPITAL_COMMUNITY): Payer: Self-pay | Admitting: *Deleted

## 2017-06-24 ENCOUNTER — Inpatient Hospital Stay (HOSPITAL_COMMUNITY)
Admission: RE | Admit: 2017-06-24 | Discharge: 2017-07-03 | DRG: 945 | Disposition: A | Discharge status: Home / Self Care | Payer: No Typology Code available for payment source | Source: Intra-hospital | Attending: Physical Medicine & Rehabilitation | Admitting: Physical Medicine & Rehabilitation

## 2017-06-24 ENCOUNTER — Other Ambulatory Visit: Payer: Self-pay

## 2017-06-24 DIAGNOSIS — S299XXS Unspecified injury of thorax, sequela: Secondary | ICD-10-CM

## 2017-06-24 DIAGNOSIS — S3210XD Unspecified fracture of sacrum, subsequent encounter for fracture with routine healing: Secondary | ICD-10-CM

## 2017-06-24 DIAGNOSIS — Y92410 Unspecified street and highway as the place of occurrence of the external cause: Secondary | ICD-10-CM

## 2017-06-24 DIAGNOSIS — D62 Acute posthemorrhagic anemia: Secondary | ICD-10-CM | POA: Diagnosis present

## 2017-06-24 DIAGNOSIS — S069XAA Unspecified intracranial injury with loss of consciousness status unknown, initial encounter: Secondary | ICD-10-CM | POA: Diagnosis present

## 2017-06-24 DIAGNOSIS — S82201E Unspecified fracture of shaft of right tibia, subsequent encounter for open fracture type I or II with routine healing: Secondary | ICD-10-CM

## 2017-06-24 DIAGNOSIS — S82142D Displaced bicondylar fracture of left tibia, subsequent encounter for closed fracture with routine healing: Secondary | ICD-10-CM

## 2017-06-24 DIAGNOSIS — S299XXA Unspecified injury of thorax, initial encounter: Secondary | ICD-10-CM

## 2017-06-24 DIAGNOSIS — F1010 Alcohol abuse, uncomplicated: Secondary | ICD-10-CM

## 2017-06-24 DIAGNOSIS — S12600D Unspecified displaced fracture of seventh cervical vertebra, subsequent encounter for fracture with routine healing: Secondary | ICD-10-CM | POA: Diagnosis not present

## 2017-06-24 DIAGNOSIS — S72351K Displaced comminuted fracture of shaft of right femur, subsequent encounter for closed fracture with nonunion: Secondary | ICD-10-CM

## 2017-06-24 DIAGNOSIS — S7291XD Unspecified fracture of right femur, subsequent encounter for closed fracture with routine healing: Secondary | ICD-10-CM

## 2017-06-24 DIAGNOSIS — S82871D Displaced pilon fracture of right tibia, subsequent encounter for closed fracture with routine healing: Secondary | ICD-10-CM

## 2017-06-24 DIAGNOSIS — S064X9D Epidural hemorrhage with loss of consciousness of unspecified duration, subsequent encounter: Secondary | ICD-10-CM | POA: Diagnosis present

## 2017-06-24 DIAGNOSIS — S02113D Unspecified occipital condyle fracture, subsequent encounter for fracture with routine healing: Secondary | ICD-10-CM

## 2017-06-24 DIAGNOSIS — T148XXA Other injury of unspecified body region, initial encounter: Secondary | ICD-10-CM

## 2017-06-24 DIAGNOSIS — S27321D Contusion of lung, unilateral, subsequent encounter: Secondary | ICD-10-CM

## 2017-06-24 DIAGNOSIS — R4189 Other symptoms and signs involving cognitive functions and awareness: Secondary | ICD-10-CM | POA: Diagnosis present

## 2017-06-24 DIAGNOSIS — S32502D Unspecified fracture of left pubis, subsequent encounter for fracture with routine healing: Secondary | ICD-10-CM

## 2017-06-24 DIAGNOSIS — S42102D Fracture of unspecified part of scapula, left shoulder, subsequent encounter for fracture with routine healing: Secondary | ICD-10-CM

## 2017-06-24 DIAGNOSIS — S32501D Unspecified fracture of right pubis, subsequent encounter for fracture with routine healing: Secondary | ICD-10-CM

## 2017-06-24 DIAGNOSIS — S36116D Major laceration of liver, subsequent encounter: Secondary | ICD-10-CM | POA: Diagnosis not present

## 2017-06-24 DIAGNOSIS — S92101S Unspecified fracture of right talus, sequela: Secondary | ICD-10-CM

## 2017-06-24 DIAGNOSIS — S12500D Unspecified displaced fracture of sixth cervical vertebra, subsequent encounter for fracture with routine healing: Secondary | ICD-10-CM

## 2017-06-24 DIAGNOSIS — S069X2S Unspecified intracranial injury with loss of consciousness of 31 minutes to 59 minutes, sequela: Secondary | ICD-10-CM

## 2017-06-24 DIAGNOSIS — S32592A Other specified fracture of left pubis, initial encounter for closed fracture: Secondary | ICD-10-CM

## 2017-06-24 DIAGNOSIS — S062X1S Diffuse traumatic brain injury with loss of consciousness of 30 minutes or less, sequela: Secondary | ICD-10-CM

## 2017-06-24 DIAGNOSIS — T07XXXA Unspecified multiple injuries, initial encounter: Secondary | ICD-10-CM

## 2017-06-24 DIAGNOSIS — S2243XD Multiple fractures of ribs, bilateral, subsequent encounter for fracture with routine healing: Secondary | ICD-10-CM | POA: Diagnosis not present

## 2017-06-24 DIAGNOSIS — S069X9A Unspecified intracranial injury with loss of consciousness of unspecified duration, initial encounter: Secondary | ICD-10-CM | POA: Diagnosis present

## 2017-06-24 DIAGNOSIS — R1310 Dysphagia, unspecified: Secondary | ICD-10-CM

## 2017-06-24 DIAGNOSIS — S32120S Nondisplaced Zone II fracture of sacrum, sequela: Secondary | ICD-10-CM

## 2017-06-24 DIAGNOSIS — S32591S Other specified fracture of right pubis, sequela: Secondary | ICD-10-CM

## 2017-06-24 DIAGNOSIS — S32591A Other specified fracture of right pubis, initial encounter for closed fracture: Secondary | ICD-10-CM | POA: Diagnosis present

## 2017-06-24 DIAGNOSIS — S32592S Other specified fracture of left pubis, sequela: Secondary | ICD-10-CM

## 2017-06-24 DIAGNOSIS — S7291XA Unspecified fracture of right femur, initial encounter for closed fracture: Secondary | ICD-10-CM | POA: Diagnosis present

## 2017-06-24 LAB — CBC
HEMATOCRIT: 38.4 % (ref 36.0–46.0)
HEMOGLOBIN: 12.7 g/dL (ref 12.0–15.0)
MCH: 30.1 pg (ref 26.0–34.0)
MCHC: 33.1 g/dL (ref 30.0–36.0)
MCV: 91 fL (ref 78.0–100.0)
Platelets: 475 10*3/uL — ABNORMAL HIGH (ref 150–400)
RBC: 4.22 MIL/uL (ref 3.87–5.11)
RDW: 14.4 % (ref 11.5–15.5)
WBC: 8.1 10*3/uL (ref 4.0–10.5)

## 2017-06-24 LAB — CREATININE, SERUM
Creatinine, Ser: 0.48 mg/dL (ref 0.44–1.00)
GFR calc Af Amer: >60 mL/min (ref >60)
GFR calc non Af Amer: >60 mL/min (ref >60)

## 2017-06-24 MED ORDER — ONDANSETRON HCL 4 MG PO TABS
4.0000 mg | ORAL_TABLET | Freq: Four times a day (QID) | ORAL | Status: DC | PRN
  Administered 2017-06-25: 4 mg via ORAL
  Filled 2017-06-24: qty 1

## 2017-06-24 MED ORDER — ACETAMINOPHEN 325 MG PO TABS
650.0000 mg | ORAL_TABLET | Freq: Four times a day (QID) | ORAL | Status: DC
  Administered 2017-06-24 (×2): 650 mg via ORAL
  Filled 2017-06-24 (×2): qty 2

## 2017-06-24 MED ORDER — DOCUSATE SODIUM 100 MG PO CAPS
100.0000 mg | ORAL_CAPSULE | Freq: Two times a day (BID) | ORAL | Status: DC
  Administered 2017-06-24 – 2017-07-03 (×12): 100 mg via ORAL
  Filled 2017-06-24 (×17): qty 1

## 2017-06-24 MED ORDER — OXYCODONE HCL 5 MG PO TABS
5.0000 mg | ORAL_TABLET | ORAL | Status: DC | PRN
  Administered 2017-06-25 (×4): 10 mg via ORAL
  Filled 2017-06-24 (×5): qty 2

## 2017-06-24 MED ORDER — ONDANSETRON HCL 4 MG/2ML IJ SOLN: 4.0000 mg | Freq: Four times a day (QID) | INTRAMUSCULAR | Status: DC | PRN

## 2017-06-24 MED ORDER — BUDESONIDE 0.5 MG/2ML IN SUSP
0.5000 mg | Freq: Two times a day (BID) | RESPIRATORY_TRACT | Status: DC
Start: 2017-06-24 — End: 2017-07-03
  Administered 2017-06-24 – 2017-07-03 (×15): 0.5 mg via RESPIRATORY_TRACT
  Filled 2017-06-24 (×17): qty 2

## 2017-06-24 MED ORDER — NYSTATIN 100000 UNIT/ML MT SUSP
5.0000 mL | Freq: Four times a day (QID) | OROMUCOSAL | Status: DC
  Administered 2017-06-24 – 2017-06-25 (×3): 500000 [IU] via ORAL
  Administered 2017-06-25: 5 mL via ORAL
  Administered 2017-06-25 – 2017-07-03 (×20): 500000 [IU] via ORAL
  Filled 2017-06-24 (×29): qty 5

## 2017-06-24 MED ORDER — SORBITOL 70 % SOLN: 30.0000 mL | Freq: Every day | Status: DC | PRN

## 2017-06-24 MED ORDER — BISACODYL 10 MG RE SUPP: 10.0000 mg | Freq: Every day | RECTAL | Status: DC | PRN

## 2017-06-24 MED ORDER — ENOXAPARIN SODIUM 30 MG/0.3ML ~~LOC~~ SOLN
30.0000 mg | Freq: Two times a day (BID) | SUBCUTANEOUS | Status: DC
  Administered 2017-06-24 – 2017-07-03 (×18): 30 mg via SUBCUTANEOUS
  Filled 2017-06-24 (×17): qty 0.3

## 2017-06-24 MED ORDER — RESOURCE THICKENUP CLEAR PO POWD
ORAL | Status: DC | PRN
  Filled 2017-06-24: qty 125

## 2017-06-24 MED ORDER — QUETIAPINE FUMARATE 25 MG PO TABS
25.0000 mg | ORAL_TABLET | Freq: Every day | ORAL | Status: DC
  Administered 2017-06-24 – 2017-07-02 (×9): 25 mg via ORAL
  Filled 2017-06-24 (×10): qty 1

## 2017-06-24 MED ORDER — METOPROLOL TARTRATE 25 MG PO TABS
25.0000 mg | ORAL_TABLET | Freq: Two times a day (BID) | ORAL | Status: DC
  Administered 2017-06-25 – 2017-07-03 (×17): 25 mg via ORAL
  Filled 2017-06-24 (×19): qty 1

## 2017-06-24 MED ORDER — OXYCODONE HCL 5 MG PO TABS
5.0000 mg | ORAL_TABLET | ORAL | Status: DC | PRN
  Administered 2017-06-24: 10 mg via ORAL
  Filled 2017-06-24: qty 2

## 2017-06-24 MED ORDER — ENSURE ENLIVE PO LIQD
237.0000 mL | Freq: Two times a day (BID) | ORAL | Status: DC
  Administered 2017-06-26 – 2017-07-02 (×13): 237 mL via ORAL

## 2017-06-24 MED ORDER — TRAMADOL HCL 50 MG PO TABS: 50.0000 mg | ORAL_TABLET | Freq: Four times a day (QID) | ORAL | Status: DC | PRN

## 2017-06-24 MED ORDER — ACETAMINOPHEN 325 MG PO TABS
650.0000 mg | ORAL_TABLET | Freq: Four times a day (QID) | ORAL | Status: DC
  Administered 2017-06-24 – 2017-07-03 (×21): 650 mg via ORAL
  Filled 2017-06-24 (×25): qty 2

## 2017-06-24 MED ORDER — ENOXAPARIN SODIUM 30 MG/0.3ML ~~LOC~~ SOLN: 30.0000 mg | Freq: Two times a day (BID) | SUBCUTANEOUS | Status: DC

## 2017-06-24 MED ORDER — ONDANSETRON HCL 4 MG/2ML IJ SOLN
4.0000 mg | Freq: Four times a day (QID) | INTRAMUSCULAR | Status: DC
  Administered 2017-06-24: 4 mg via INTRAVENOUS
  Filled 2017-06-24: qty 2

## 2017-06-24 NOTE — Progress Notes (Signed)
Patient is transfered from room 4NP02 to unit 4W07 at this time. Alert and in stable condition. Report given to receiving nurse Luz, RN with all questions answered. Transported via bed with family and all belongings at side. 
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
## 2017-06-24 NOTE — Progress Notes (Signed)
I met with pt at bedside with a friend and used the [employer] interpretor system to discuss her admission to inpt rehab today. I then contacted her sister, Verdene Lennert, by phone, and she is in agreement to inpt rehab admit today. I will make the arrangements. Trauma PA, RN CM and SW made aware. [phone]

## 2017-06-25 ENCOUNTER — Other Ambulatory Visit: Payer: Self-pay

## 2017-06-25 ENCOUNTER — Inpatient Hospital Stay (HOSPITAL_COMMUNITY): Payer: Self-pay | Admitting: Occupational Therapy

## 2017-06-25 ENCOUNTER — Inpatient Hospital Stay (HOSPITAL_COMMUNITY): Payer: Self-pay | Admitting: Physical Therapy

## 2017-06-25 ENCOUNTER — Encounter (HOSPITAL_COMMUNITY): Payer: Self-pay | Admitting: *Deleted

## 2017-06-25 ENCOUNTER — Inpatient Hospital Stay (HOSPITAL_COMMUNITY): Payer: No Typology Code available for payment source

## 2017-06-25 ENCOUNTER — Inpatient Hospital Stay (HOSPITAL_COMMUNITY): Payer: Self-pay

## 2017-06-25 ENCOUNTER — Inpatient Hospital Stay (HOSPITAL_COMMUNITY): Payer: No Typology Code available for payment source | Admitting: Speech Pathology

## 2017-06-25 DIAGNOSIS — S062X1S Diffuse traumatic brain injury with loss of consciousness of 30 minutes or less, sequela: Secondary | ICD-10-CM

## 2017-06-25 DIAGNOSIS — M7989 Other specified soft tissue disorders: Secondary | ICD-10-CM

## 2017-06-25 DIAGNOSIS — S069X2S Unspecified intracranial injury with loss of consciousness of 31 minutes to 59 minutes, sequela: Secondary | ICD-10-CM

## 2017-06-25 LAB — COMPREHENSIVE METABOLIC PANEL
ALBUMIN: 4.3 g/dL (ref 3.5–5.0)
ALT: 36 U/L (ref 14–54)
ANION GAP: 13 (ref 5–15)
AST: 36 U/L (ref 15–41)
Alkaline Phosphatase: 388 U/L — ABNORMAL HIGH (ref 38–126)
BILIRUBIN TOTAL: 1.4 mg/dL — AB (ref 0.3–1.2)
BUN: 11 mg/dL (ref 6–20)
CHLORIDE: 104 mmol/L (ref 101–111)
CO2: 22 mmol/L (ref 22–32)
Calcium: 10 mg/dL (ref 8.9–10.3)
Creatinine, Ser: 0.43 mg/dL — ABNORMAL LOW (ref 0.44–1.00)
GFR calc Af Amer: >60 mL/min (ref >60)
GFR calc non Af Amer: >60 mL/min (ref >60)
GLUCOSE: 105 mg/dL — AB (ref 65–99)
POTASSIUM: 3.6 mmol/L (ref 3.5–5.1)
Sodium: 139 mmol/L (ref 135–145)
TOTAL PROTEIN: 9.9 g/dL — AB (ref 6.5–8.1)

## 2017-06-25 LAB — CBC WITH DIFFERENTIAL/PLATELET
BASOS ABS: 0 10*3/uL (ref 0.0–0.1)
BASOS PCT: 0 %
EOS ABS: 0 10*3/uL (ref 0.0–0.7)
EOS PCT: 0 %
HEMATOCRIT: 37.8 % (ref 36.0–46.0)
Hemoglobin: 12.1 g/dL (ref 12.0–15.0)
Lymphocytes Relative: 26 %
Lymphs Abs: 2 10*3/uL (ref 0.7–4.0)
MCH: 28.9 pg (ref 26.0–34.0)
MCHC: 32 g/dL (ref 30.0–36.0)
MCV: 90.4 fL (ref 78.0–100.0)
MONO ABS: 0.6 10*3/uL (ref 0.1–1.0)
MONOS PCT: 8 %
Neutro Abs: 5 10*3/uL (ref 1.7–7.7)
Neutrophils Relative %: 66 %
PLATELETS: 492 10*3/uL — AB (ref 150–400)
RBC: 4.18 MIL/uL (ref 3.87–5.11)
RDW: 14.6 % (ref 11.5–15.5)
WBC: 7.6 10*3/uL (ref 4.0–10.5)

## 2017-06-25 LAB — GLUCOSE, CAPILLARY: GLUCOSE-CAPILLARY: 113 mg/dL — AB (ref 65–99)

## 2017-06-25 MED ORDER — CLONAZEPAM 0.5 MG PO TABS
0.5000 mg | ORAL_TABLET | Freq: Once | ORAL | Status: AC
  Administered 2017-06-25: 0.5 mg via ORAL
  Filled 2017-06-25: qty 1

## 2017-06-25 MED ORDER — METHYLPHENIDATE HCL 5 MG PO TABS
5.0000 mg | ORAL_TABLET | Freq: Two times a day (BID) | ORAL | Status: DC
  Administered 2017-06-25 – 2017-07-02 (×12): 5 mg via ORAL
  Filled 2017-06-25 (×14): qty 1

## 2017-06-25 NOTE — Progress Notes (Signed)
*  PRELIMINARY RESULTS* Vascular Ultrasound Bilateral lower extremity venous duplex has been completed.  Preliminary findings: No evidence of deep vein thrombosis in the visualized veins or baker's cysts bilaterally.  Limited visualization of right calf vessels and mid left calf vessels due to bandages.  Briana FischerCharlotte C Callyn Severtson 06/25/2017, 11:51 AM

## 2017-06-25 NOTE — Progress Notes (Signed)
Occupational Therapy Note  Patient Details  Name: Briana [name] MRN: [id number] Date of Birth: 03/25/1996  Today's Date: 06/25/2017 OT Individual Time: 1400-1425 OT Individual Time Calculation (min): 25 min   Pt with no s/s of pain Individual Therapy  Pt asleep upon arrival but easily aroused.  No interpreter present.  OT intervention with focus on vision assessment.  Assessment limited secondary to no interpreter.  Eye movement, saccades, and convergence appear to be WDL.  Pt with no report of double vision or blurred vision.  Pt initially stated she wanted to eat but declined when presented with lunch tray.  Pt remained in bed with all needs within reach.    Lavone NeriLanier, Ruthanne Mcneish [facility] Center[facility]appell 06/25/2017, 2:50 PM

## 2017-06-25 NOTE — Progress Notes (Signed)
Patient information reviewed and entered into eRehab system by Perpetua Elling, RN, CRRN, PPS Coordinator.  Information including medical coding and functional independence measure will be reviewed and updated through discharge.    
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
## 2017-06-25 NOTE — Progress Notes (Signed)
Ten minutes after leaving pt's room,RN was call by NT to the pt's room because pt. Was continuously shaking her head,the movements looks like tremors.Pt. Was able to verbalized that she still having headaches on the posterior part of her head,that is pounding. VS on the normal range,rapid response nurse assessed the pt. As well,she concurred that the pt. Is not having seizures.MD. Was called,orders for clonazepam 0.5 mg PO were received,as well as MRI. Pt. Took the medicine,then she felt asleep.Keep monitoring pt. Closely and assessing her needs.

## 2017-06-25 NOTE — Progress Notes (Signed)
RECEIVED PT. AS A TRANSFER FROM [facility].pt. AND HER FAMILY WERE ORIENTED TO THE UNIT ROUTINE AND Protocols,SAFETY PLAN WAS EXPLAINED,FALL PREVENTION PLAN WAS EXPLAINED.
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
## 2017-06-26 ENCOUNTER — Telehealth (HOSPITAL_COMMUNITY): Payer: Self-pay | Admitting: Emergency Medicine

## 2017-06-26 ENCOUNTER — Inpatient Hospital Stay (HOSPITAL_COMMUNITY): Payer: Self-pay | Admitting: Physical Therapy

## 2017-06-26 ENCOUNTER — Inpatient Hospital Stay (HOSPITAL_COMMUNITY): Payer: No Typology Code available for payment source

## 2017-06-26 ENCOUNTER — Inpatient Hospital Stay (HOSPITAL_COMMUNITY): Payer: Self-pay

## 2017-06-26 ENCOUNTER — Inpatient Hospital Stay (HOSPITAL_COMMUNITY): Payer: No Typology Code available for payment source | Admitting: Speech Pathology

## 2017-06-26 NOTE — Progress Notes (Signed)
Physical Therapy Session Note  Patient Details  Name: Briana [name] MRN: [id number] Date of Birth: December 10, 1995  Today's Date: 06/26/2017 PT Individual Time: 1300-1400 PT Individual Time Calculation (min): 60 min   Short Term Goals: Week 1:  PT Short Term Goal 1 (Week 1): Pt will perform bed mobility with S from flat bed PT Short Term Goal 2 (Week 1): Pt will perform transfer w/c <>bed with min guard and mod cues for technique PT Short Term Goal 3 (Week 1): Pt will initiate car transfer training PT Short Term Goal 4 (Week 1): Pt will initiate w/c propulsion  Skilled Therapeutic Interventions/Progress Updates:   Pt asleep in supine and easily woken up. Interpreter not present until 10 minutes into session. Pt motioning to wash hair. Focused on self-care and tolerance to upright/OOB activity this session in addition to attending to a self care task. Transferred to EOB w/ supervision and visual cues for precautions/safety. Transferred to w/c via slide board transfer to w/c w/ min guard and visual/manual cues for technique. Pt brushed hair prior to washing w/ supervision and verbal cues to attend to task and for technique. Unable to brush back of head 2/2 knots in hair. Total assist w/c transport to/from bathroom/shower. Utilizing tilting functions on pt's w/c to set up safe hair washing avoiding water contact to LEs, UEs, and wounds. Total assist to wash hair, although pt able to assist w/ lifting head and turning w/ supervision. Returned to sink and pt brushed wet hair w/ supervision, total assist to brush back of head with knots. Pt able to attend to task w/ occasional cues and maintain static sitting w/o back support in 2-3 min bouts w/ supervision. Ended session in w/c and in care of OT, all needs met.   Therapy Documentation Precautions:  Precautions Precautions: Fall Precaution Comments: CAM boot RLE (only able to be off for short period of time in bed) Required Braces or Orthoses: Other  Brace/Splint Other Brace/Splint: CAM Boot Restrictions Weight Bearing Restrictions: Yes LUE Weight Bearing: Weight bearing as tolerated RLE Weight Bearing: Non weight bearing LLE Weight Bearing: Non weight bearing Vital Signs: Therapy Vitals Temp: 98.9 F (37.2 C) Temp Source: Oral Pulse Rate: (!) 110 Resp: 17 BP: 125/86 Patient Position (if appropriate): Lying Oxygen Therapy SpO2: 100 % O2 Device: Not Delivered  See Function Navigator for Current Functional Status.   Therapy/Group: Individual Therapy  Amorah Sebring K Arnette 06/26/2017, 3:12 PM
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
## 2017-06-26 NOTE — IPOC Note (Signed)
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, sequela (HCC) Active Problems:   Bilateral pubic rami fractures (HCC)   Femur fracture, right (HCC)     Functional Problem List: Nursing Endurance, Motor, Nutrition, Pain, Safety, Skin Integrity  PT Balance, Skin Integrity, Behavior  OT Balance, Cognition, Endurance, Motor, Pain, Skin Integrity, Safety  SLP Cognition, Perception, Safety  TR         Basic ADL's: OT Grooming, Bathing, Dressing, Toileting     Advanced  ADL's: OT       Transfers: PT Bed Mobility, Bed to Chair, Car, Occupational psychologist, Research scientist (life sciences): PT Wheelchair Mobility     Additional Impairments: OT None  SLP Swallowing, Social Cognition   Social Interaction, Problem Solving, Memory, Attention, Awareness  TR      Anticipated Outcomes Item Anticipated Outcome  Self Feeding n/a  Swallowing  Mod I   Basic self-care  supervision  Toileting  supervision   Bathroom Transfers supervision  Bowel/Bladder  Continent to bowel and bladder with min. assist.  Transfers  S  Locomotion  S w/c mobility  Communication     Cognition  Mod I to Supervision  Pain  Less than 3,on 1 to 10 scale.  Safety/Judgment  Free from falls during her stay in rehab.   Therapy Plan: PT Intensity: Minimum of 1-2 x/day ,45 to 90 minutes PT Frequency: 5 out of 7 days PT Duration Estimated Length of Stay: 12-14 days OT Intensity: Minimum of 1-2 x/day, 45 to 90 minutes OT Frequency: 5 out of 7 days OT Duration/Estimated Length of Stay: ~12-14 days SLP Intensity: Minumum of 1-2 x/day, 30 to 90 minutes SLP Frequency: 3 to 5 out of 7 days SLP Duration/Estimated Length of Stay: 12 to 14 days    Team  Interventions: Nursing Interventions Patient/Family Education, Pain Management, Skin Care/Wound Management, Psychosocial Support, Discharge Planning  PT interventions Discharge planning, Functional mobility training, Psychosocial support, Therapeutic Activities, Wheelchair propulsion/positioning, Therapeutic Exercise, Skin care/wound management, Balance/vestibular training, Cognitive remediation/compensation, DME/adaptive equipment instruction, UE/LE Strength taining/ROM, UE/LE Coordination activities, Patient/family education, Community reintegration, Pain management, Splinting/orthotics  OT Interventions Warden/ranger, Discharge planning, Functional electrical stimulation, Pain management, Self Care/advanced ADL retraining, Therapeutic Activities, UE/LE Coordination activities, Cognitive remediation/compensation, Disease mangement/prevention, Functional mobility training, Patient/family education, Skin care/wound managment, Therapeutic Exercise, UE/LE Strength taining/ROM, Splinting/orthotics, Psychosocial support, Neuromuscular re-education, DME/adaptive equipment instruction, Community reintegration, Visual/perceptual remediation/compensation  SLP Interventions Cognitive remediation/compensation, Financial trader, Dysphagia/aspiration precaution training, Patient/family education, Therapeutic Activities, Internal/external aids  TR Interventions    SW/CM Interventions Discharge Planning, Psychosocial Support, Patient/Family Education   Barriers to Discharge MD  Medical stability and Behavior  Nursing      PT Weight bearing restrictions    OT      SLP      SW       Team Discharge Planning: Destination: PT-Home ,OT- Home , SLP-Home Projected Follow-up: PT-Home health PT, OT-  Outpatient OT, SLP-24 hour supervision/assistance, Home Health SLP Projected Equipment Needs: PT-Wheelchair cushion (measurements), Wheelchair (measurements), OT- To be determined, SLP-To be  determined Equipment Details: PT-16x16 w/c, specs TBD, OT-  Patient/family involved in discharge planning: PT- Patient,  OT-Patient, SLP-Patient  MD ELOS: 12-14 days Medical Rehab Prognosis:  Excellent Assessment: The patient has  been admitted for CIR therapies with the diagnosis of TBI with polytrauma, . The team will be addressing functional mobility, strength, stamina, balance, safety, adaptive techniques and equipment, self-care, bowel and bladder mgt, patient and caregiver education, NMR, cognitive behavioral rx, pain control, re-establish sleep-wake cycle. Goals have been set at supervision for self-care and ADLs, supervision for mobility at w/c level, supervision for cognition.    Ranelle OysterZachary T. Queen Abbett, MD, FAAPMR      See Team Conference Notes for weekly updates to the plan of care

## 2017-06-26 NOTE — Progress Notes (Signed)
Occupational Therapy Session Note  Patient Details  Name: [name] MRN: [id number] Date of Birth: 01/23/1996  Today's Date: 06/26/2017 OT Individual Time: 0800-0900 OT Individual Time Calculation (min): 60 min    Short Term Goals: Week 1:  OT Short Term Goal 1 (Week 1): Pt will perform drop arm BSC with supervision OT Short Term Goal 2 (Week 1): Pt will perform LB dressing with supervision with lateral leans OT Short Term Goal 3 (Week 1): Pt will perform grooming with setup  OT Short Term Goal 4 (Week 1): Pt will be oriented x4 with min external cues  Skilled Therapeutic Interventions/Progress Updates:    Pt resting in bed upon arrival and agreeable to getting OOB.  Interpreter initially not present but joined session after ~20 mins.  Pt sat EOB at supervision level and performed slide board transfer with steady A.  Pt did not engaged in BADLs stating preference for female therapists to assist with bathing/dressing tasks.  Focus on w/c mobility and initiated discharge planning.  Pt remained in w/c with all needs within reach.    Therapy Documentation Precautions:  Precautions Precautions: Fall Precaution Comments: CAM boot RLE (at all times) Restrictions Weight Bearing Restrictions: Yes LUE Weight Bearing: Weight bearing as tolerated RLE Weight Bearing: Non weight bearing LLE Weight Bearing: Non weight bearing Pain:  Pt with s/s of pain when positioning LLE; repositioned  See Function Navigator for Current Functional Status.   Therapy/Group: Individual Therapy  Rich BraveLanier, Rhemi Balbach Chappell 06/26/2017, 9:02 AM
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
## 2017-06-26 NOTE — Progress Notes (Signed)
Physical Therapy Session Note  Patient Details  Name: Briana [name] MRN: [id number] Date of Birth: 07/11/1995  Today's Date: 06/26/2017 Briana [name] Individual Time: 1005-1030 Briana [name] Individual Time Calculation (min): 25 min   Short Term Goals: Week 1:  Briana [name] Short Term Goal 1 (Week 1): Briana [name] will perform bed mobility with S from flat bed Briana [name] Short Term Goal 2 (Week 1): Briana [name] will perform transfer w/c <>bed with min guard and mod cues for technique Briana [name] Short Term Goal 3 (Week 1): Briana [name] will initiate car transfer training Briana [name] Short Term Goal 4 (Week 1): Briana [name] will initiate w/c propulsion  Skilled Therapeutic Interventions/Progress Updates:    Briana [name] presents in bed with headache but agreeable to participate in therapies. Dr. Jena GaussHaddix assess Briana [name]'s RLE and clarified orders for CAM boot. To be worn on RLE - ok to have off for short period of time in the bed. Briana [name] able to move in the bed with supervision to min assist (for management of RLE) and cues for maintaining NWB on LLE as Briana [name] wanting to push through LE's when moving. Performed slideboard transfers with min assist using slideboard with cues for safety, slowed movement, and not to push through BLE. Instructed in w/c mobility training on unit ranging from supervision to max assist due to Briana [name] demonstrating no control of w/c and not listening to cues (despite hand over hand assist at times) for directional changes but demonstrating improved technique with practice but continues to require up to max assist. End of session transferred back to bed with assist as described above and all needs in reach.   Medical interpreter present during session.   Therapy Documentation Precautions:  Precautions Precautions: Fall Precaution Comments: CAM boot RLE (only able to be off for short period of time in bed) Required Braces or Orthoses: Other Brace/Splint Other Brace/Splint: CAM Boot Restrictions Weight Bearing Restrictions: Yes LUE Weight Bearing: Weight bearing as tolerated RLE Weight Bearing:  Non weight bearing LLE Weight Bearing: Non weight bearing  Pain:  reports headache - declines medication   See Function Navigator for Current Functional Status.   Therapy/Group: Individual Therapy  Briana [name], Briana [name]  Briana [name], Briana [name], Briana [name]  06/26/2017, 12:07 PM

## 2017-06-27 ENCOUNTER — Inpatient Hospital Stay (HOSPITAL_COMMUNITY): Payer: Self-pay | Admitting: Physical Therapy

## 2017-06-27 ENCOUNTER — Inpatient Hospital Stay (HOSPITAL_COMMUNITY): Payer: Self-pay | Admitting: Occupational Therapy

## 2017-06-27 DIAGNOSIS — S069X9D Unspecified intracranial injury with loss of consciousness of unspecified duration, subsequent encounter: Secondary | ICD-10-CM
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
## 2017-06-27 NOTE — Progress Notes (Signed)
Occupational Therapy Session Note  Patient Details  Name: Briana [name] MRN: [id number] Date of Birth: 1995-11-21  Today's Date: 06/27/2017 OT Individual Time: 0800-0900 OT Individual Time Calculation (min): 60 min    Short Term Goals: Week 1:  OT Short Term Goal 1 (Week 1): Pt will perform drop arm BSC with supervision OT Short Term Goal 2 (Week 1): Pt will perform LB dressing with supervision with lateral leans OT Short Term Goal 3 (Week 1): Pt will perform grooming with setup  OT Short Term Goal 4 (Week 1): Pt will be oriented x4 with min external cues  Skilled Therapeutic Interventions/Progress Updates:    Pt seen this session for ADL training with a focus on functional mobility and activity tolerance.  Translator present. Pt received in bed stating she had no pain and was not feeling dizzy.  She did not want to bathe LB or change shorts as she said she just put them on last night.  Cam boot placed on R foot and pt sat to EOB with assist to support R foot. She was wearing short shorts so she had to work on lateral leans to scoot shorts down enough to cover skin for safe slide board transfers.  Board positioned for pt and she transferred with S to w/c.  Once in chair she worked on [employer] with set up, dressing with min A for bra, grooming, drank 1/3 of thickened Ensure with small coughs 3x,  Encouraged smaller sips.  Pt refused to eat breakfast.  Pt then stated she felt dizzy and needed to get back to bed.  With 2nd transfer, pt required min A for hand placement and verbal cues to maintain forward lean and increased steadying A due to her feeling less stable.  Pt needed min A to lift R foot onto bed.  CAM boot doffed, bed alarm set, and pt with all needs met.   Therapy Documentation Precautions:  Precautions Precautions: Fall Precaution Comments: CAM boot RLE (only able to be off for short period of time in bed) Required Braces or Orthoses: Other Brace/Splint Other Brace/Splint: CAM  Boot Restrictions Weight Bearing Restrictions: Yes LUE Weight Bearing: Weight bearing as tolerated RLE Weight Bearing: Non weight bearing LLE Weight Bearing: Non weight bearing    Vital Signs: Oxygen Therapy SpO2: 99 % O2 Device: Not Delivered Pain: Pain Assessment Pain Assessment: No/denies pain ADL: ADL ADL Comments: see functional naviagtor  See Function Navigator for Current Functional Status.   Therapy/Group: Individual Therapy  [name] 06/27/2017, 8:51 AM
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
## 2017-06-27 NOTE — Progress Notes (Signed)
Physical Therapy Session Note  Patient Details  Name: Briana [name] MRN: [id number] Date of Birth: 06-25-1995  Today's Date: 06/27/2017 PT Individual Time: 1000-1100 PT Individual Time Calculation (min): 60 min   Short Term Goals: Week 1:  PT Short Term Goal 1 (Week 1): Pt will perform bed mobility with S from flat bed PT Short Term Goal 2 (Week 1): Pt will perform transfer w/c <>bed with min guard and mod cues for technique PT Short Term Goal 3 (Week 1): Pt will initiate car transfer training PT Short Term Goal 4 (Week 1): Pt will initiate w/c propulsion  Skilled Therapeutic Interventions/Progress Updates:   Pt in supine and agreeable to therapy, no c/o pain at rest, although grimacing w/ active movements. Interpreter present throughout session. Transferred to EOB w/ supervision and to w/c via slide board transfer w/ min guard and verbal cues for safety to prevent WB through BLEs. Pt self-propelled w/c to/from therapy gym w/ supervision overall using BUEs. Min assist for obstacle avoidance and for propelling technique. Transferred to/from mat in gym using same transfer technique as described above. Performed LE strengthening exercises as listed below w/ emphasis on moving in pain free ROM and attention to task of counting during exercises. Returned to room via w/c and transferred back to EOB and to supine w/ min assist for LE management. Provided thickened juice for pt prior to leaving room, verbal cues for slow sips. Ended session in supine, call bell within reach and all needs met.   BLE strengthening exercises: -seated LAQs x10  -supine SAQs 2x5  -supine assisted heel slides x5 -supine assisted SLR x5  -supine adduction squeeze x10  -ankle pumps 2x5   Therapy Documentation Precautions:  Precautions Precautions: Fall Precaution Comments: CAM boot RLE (only able to be off for short period of time in bed) Required Braces or Orthoses: Other Brace/Splint Other Brace/Splint: CAM  Boot Restrictions Weight Bearing Restrictions: Yes LUE Weight Bearing: Weight bearing as tolerated RLE Weight Bearing: Non weight bearing LLE Weight Bearing: Non weight bearing Vital Signs: Therapy Vitals Pulse Rate: 71 BP: 120/64 Oxygen Therapy SpO2: 99 % O2 Device: Not Delivered Pain: Pain Assessment Pain Assessment: No/denies pain  See Function Navigator for Current Functional Status.   Therapy/Group: Individual Therapy  Tenley Winward K Arnette 06/27/2017, 11:50 AM

## 2017-06-27 NOTE — Plan of Care (Signed)
  No Outcome RH SKIN INTEGRITY RH STG ABLE TO PERFORM INCISION/WOUND CARE W/ASSISTANCE Description STG Able To Perform Incision/Wound Care With Min.Assistance.  Max assistance with dressing change. 06/27/2017 0443 by [name], Maelie Chriswell A, RN Flowsheets Taken 06/27/2017 [phone]  STG: Pt will be able to perform incision/wound care with assistance 2-Maximum assistance

## 2017-06-28 ENCOUNTER — Inpatient Hospital Stay (HOSPITAL_COMMUNITY): Payer: Self-pay

## 2017-06-28 DIAGNOSIS — S069X9S Unspecified intracranial injury with loss of consciousness of unspecified duration, sequela: Secondary | ICD-10-CM

## 2017-06-28 DIAGNOSIS — S062X1S Diffuse traumatic brain injury with loss of consciousness of 30 minutes or less, sequela: Secondary | ICD-10-CM

## 2017-06-28 DIAGNOSIS — S32592S Other specified fracture of left pubis, sequela: Secondary | ICD-10-CM

## 2017-06-28 DIAGNOSIS — S32591S Other specified fracture of right pubis, sequela: Secondary | ICD-10-CM

## 2017-06-28 NOTE — Plan of Care (Signed)
  [facility]UC[facility] Description Description: See Patient Education module for eduction specifics 06/28/2017 0340 - Progressing by [name], Derotha Fishbaugh A, RN RH BOWEL ELIMINATION RH STG MANAGE BOWEL WITH ASSISTANCE Description STG Manage Bowel with min. Assistance.  06/28/2017 0340 - Progressing by [name], Haiden Rawlinson A, RN RH BLADDER ELIMINATION RH STG MANAGE BLADDER WITH ASSISTANCE Description STG Manage Bladder With min. Assistance  06/28/2017 0340 - Progressing by [name], [place] Oborn A, RN RH SKIN INTEGRITY RH STG SKIN FREE OF INFECTION/BREAKDOWN Description With min. assist  06/28/2017 0340 - Progressing by [name], Ciaira Natividad A, RN RH STG MAINTAIN SKIN INTEGRITY WITH ASSISTANCE Description STG Maintain Skin Integrity With min.Assistance.  06/28/2017 0340 - Progressing by [name], Shamond Skelton A, RN RH STG ABLE TO PERFORM INCISION/WOUND CARE W/ASSISTANCE Description STG Able To Perform Incision/Wound Care With Min.Assistance.  06/28/2017 0340 - Progressing by [name], Curlee Bogan A, RN RH SAFETY RH STG ADHERE TO SAFETY PRECAUTIONS W/ASSISTANCE/DEVICE Description STG Adhere to Safety Precautions With mod.Assistance/Device.  06/28/2017 0340 - Progressing by [name], Mishaal Lansdale A, RN RH PAIN MANAGEMENT RH STG PAIN MANAGED AT OR BELOW PT'S PAIN GOAL Description Less than 3,on 1 to 10 scale  06/28/2017 0340 - Progressing by [name], Ashle Stief A, RN
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
## 2017-06-28 NOTE — Progress Notes (Signed)
Occupational Therapy Session Note  Patient Details  Name: Briana [name] MRN: [id number] Date of Birth: 1995-11-09  Today's Date: 06/28/2017 OT Individual Time: 1300-1345 OT Individual Time Calculation (min): 45 min    Short Term Goals: Week 1:  OT Short Term Goal 1 (Week 1): Pt will perform drop arm BSC with supervision OT Short Term Goal 2 (Week 1): Pt will perform LB dressing with supervision with lateral leans OT Short Term Goal 3 (Week 1): Pt will perform grooming with setup  OT Short Term Goal 4 (Week 1): Pt will be oriented x4 with min external cues  Skilled Therapeutic Interventions/Progress Updates:    1:1. Interpreter present. Pt complain of HA; medication already administered. Pt educated on AE for RLE management supine<>EOB d/t difficulty with heavy CAM boot donned. Pt dons CAM boot EOB with mod instructional cues and increased time. Pt c/o dizziness reaching towards floor, however subsides with rest. Pt instructed in use of leg lifter and able to use to bring RLE into/out of bed. Pt educated on slide board placement and how to check if board is placed correctly. Pt attempting to lift buttocks up onto board instead of leaning and placing board under leg requiring cueing for safety. Pt able to transfer throughout session EOB<>w/c with supervision and min cueing for proper board placement. Pt instructed on TTB use and transfer for when medically cleared to shower. Pt able to complete slide board transfer with A to steady board only and VC for BLE management into/out of tub with leg lifter. Exited session with pt seated in w/c, call light in reach and all needs met.  Therapy Documentation Precautions:  Precautions Precautions: Fall Precaution Comments: CAM boot RLE (only able to be off for short period of time in bed) Required Braces or Orthoses: Other Brace/Splint Other Brace/Splint: CAM Boot Restrictions Weight Bearing Restrictions: Yes LUE Weight Bearing: Weight bearing as  tolerated RLE Weight Bearing: Non weight bearing LLE Weight Bearing: Non weight bearing  See Function Navigator for Current Functional Status.   Therapy/Group: Individual Therapy  Tonny Branch 06/28/2017, 5:26 PM

## 2017-06-29 ENCOUNTER — Inpatient Hospital Stay (HOSPITAL_COMMUNITY): Payer: No Typology Code available for payment source | Admitting: Occupational Therapy

## 2017-06-29 ENCOUNTER — Inpatient Hospital Stay (HOSPITAL_COMMUNITY): Payer: Self-pay

## 2017-06-29 ENCOUNTER — Inpatient Hospital Stay (HOSPITAL_COMMUNITY): Payer: No Typology Code available for payment source

## 2017-06-29 ENCOUNTER — Inpatient Hospital Stay (HOSPITAL_COMMUNITY): Payer: No Typology Code available for payment source | Admitting: Speech Pathology

## 2017-06-29 ENCOUNTER — Inpatient Hospital Stay (HOSPITAL_COMMUNITY): Payer: Self-pay | Admitting: Physical Therapy

## 2017-06-29 NOTE — Progress Notes (Signed)
Occupational Therapy Session Note  Patient Details  Name: Briana [name] MRN: [id number] Date of Birth: 09/17/1995  Today's Date: 06/29/2017 OT Individual Time: [id number] and OT Individual Time Calculation (min): 29 min and   Short Term Goals: Week 1:  OT Short Term Goal 1 (Week 1): Pt will perform drop arm BSC with supervision OT Short Term Goal 2 (Week 1): Pt will perform LB dressing with supervision with lateral leans OT Short Term Goal 3 (Week 1): Pt will perform grooming with setup  OT Short Term Goal 4 (Week 1): Pt will be oriented x4 with min external cues  Skilled Therapeutic Interventions/Progress Updates:   Session 1: Upon entering the room, pt supine in bed with no c/o pain this session. Pt required assistance to don R LE CAM boot while supine in bed. Pt utilized leg lifter to move R LE to EOB. Pt verbalizing steps for slide board transfer correctly.Therapist placing slide board and pt transferring self into wheelchair with steady assistance of equipment. Pt seated in wheelchair at sink for grooming tasks with set up A and supervision. OT provided supervision for pt to drink nectar thick liquids this session with pt maintaining precautions. Pt remained in wheelchair at end of session with call bell and all needed items within reach.   Session 2: Upon entering the room, pt supine in bed with interpreter present. Pt with no c/o pain this session. Pt requesting to wash from bed level with min A to doff clothing items from L UE due to wound and tight clothing. Pt requiring assistance threading R LE into pants and placement of CAM boot. Pt able to pull clothing over B hips. Pt transferred from bed >wheelchair with steady assistance for equipment. OT assisted pt with washing her hair while seated in wheelchair. Pt required increased encouragement to remain seated in wheelchair for next session. Pt transitioned to SLP at end of this session.   Therapy Documentation Precautions:   Precautions Precautions: Fall Precaution Comments: CAM boot RLE (only able to be off for short period of time in bed) Required Braces or Orthoses: Other Brace/Splint Other Brace/Splint: CAM Boot Restrictions Weight Bearing Restrictions: Yes LUE Weight Bearing: Weight bearing as tolerated RLE Weight Bearing: Non weight bearing LLE Weight Bearing: Non weight bearing General:   Vital Signs:  Pain: Pain Assessment Pain Assessment: No/denies pain Faces Pain Scale: No hurt ADL: ADL ADL Comments: see functional naviagtor  See Function Navigator for Current Functional Status.   Therapy/Group: Individual Therapy  Alen BleacherBradsher, Shakyia Bosso P 06/29/2017, 12:47 PM

## 2017-06-29 NOTE — Care Management (Signed)
[facility] Individual Statement of Services  Patient Name:  Briana [name]  Date:  06/29/2017  Welcome to the [facility].  Our goal is to provide you with an individualized program based on your diagnosis and situation, designed to meet your specific needs.  With this comprehensive rehabilitation program, you will be expected to participate in at least 3 hours of rehabilitation therapies Monday-Friday, with modified therapy programming on the weekends.  Your rehabilitation program will include the following services:  Physical Therapy (PT), Occupational Therapy (OT), Speech Therapy (ST), 24 hour per day rehabilitation nursing, Therapeutic Recreaction (TR), Neuropsychology, Case Management (Child psychotherapistocial Worker), Rehabilitation Medicine, Nutrition Services, Paramedicharmacy Services and Interpreter.  Weekly team conferences will be held on Tuesdays to discuss your progress.  Your Social Worker will talk with you frequently to get your input and to update you on team discussions.  Team conferences with you and your family in attendance may also be held.  Expected length of stay: 14 days    Overall anticipated outcome: supervision overall  Depending on your progress and recovery, your program may change. Your Social Worker will coordinate services and will keep you informed of any changes. Your Social Worker's name and contact numbers are listed  below.  The following services may also be recommended but are not provided by the [facility]:   Driving Evaluations  Home Health Rehabiltiation Services  Outpatient Rehabilitation Services  Vocational Rehabilitation   Arrangements will be made to provide these services after discharge if needed.  Arrangements include referral to agencies that provide these services.  Your insurance has been verified to be:  None Your primary doctor is:  None  Pertinent information will be shared with your doctor and  your insurance company.  Social Worker:  [place] Bay[place]cy Arriana Lohmann, [place] [phone] or ([phone]) [phone]   Information discussed with and copy given to patient by: Amada JupiterHOYLE, Sebastian Dzik, 06/29/2017, 12:30 PM
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
## 2017-06-29 NOTE — Plan of Care (Signed)
  Progressing RH BOWEL ELIMINATION RH STG MANAGE BOWEL WITH ASSISTANCE Description STG Manage Bowel with min. Assistance.  06/29/2017 0126 - Progressing by [name], [name], RN RH BLADDER ELIMINATION RH STG MANAGE BLADDER WITH ASSISTANCE Description STG Manage Bladder With min. Assistance  06/29/2017 0126 - Progressing by [name], [name], RN RH SKIN INTEGRITY RH STG SKIN FREE OF INFECTION/BREAKDOWN Description With min. assist  06/29/2017 0126 - Progressing by [name], [name], RN RH STG MAINTAIN SKIN INTEGRITY WITH ASSISTANCE Description STG Maintain Skin Integrity With min.Assistance.  06/29/2017 0126 - Progressing by [name], [name], RN RH STG ABLE TO PERFORM INCISION/WOUND CARE W/ASSISTANCE Description STG Able To Perform Incision/Wound Care With Min.Assistance.  06/29/2017 0126 - Progressing by [name], [name], RN RH SAFETY RH STG ADHERE TO SAFETY PRECAUTIONS W/ASSISTANCE/DEVICE Description STG Adhere to Safety Precautions With mod.Assistance/Device.  06/29/2017 0126 - Progressing by [name], [name], RN RH PAIN MANAGEMENT RH STG PAIN MANAGED AT OR BELOW PT'S PAIN GOAL Description Less than 3,on 1 to 10 scale  06/29/2017 0126 - Progressing by [name], [name], RN
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
## 2017-06-29 NOTE — Progress Notes (Signed)
Physical Therapy Session Note  Patient Details  Name: Briana [name] MRN: [id number] Date of Birth: 09/10/1995  Today's Date: 06/29/2017 PT Individual Time: 1000-1100 PT Individual Time Calculation (min): 60 min   Short Term Goals: Week 1:  PT Short Term Goal 1 (Week 1): Pt will perform bed mobility with S from flat bed PT Short Term Goal 2 (Week 1): Pt will perform transfer w/c <>bed with min guard and mod cues for technique PT Short Term Goal 3 (Week 1): Pt will initiate car transfer training PT Short Term Goal 4 (Week 1): Pt will initiate w/c propulsion  Skilled Therapeutic Interventions/Progress Updates: Pt received supine in bed, pt denies pain when questioned however do observe grimacing with LE movement and activity. Supine>sit with S. Transfer board lateral scoot R/L x4 during session with min guard to maintain NWB BLEs and min cues for board placement. Supine/prone LE strengthening exercises performed x10 reps including heel slides, straight leg raise, isometric LE hip extension. Unable to perform ankle pumps d/t pain. Requires min guard and cues for technique with rolling supine <>prone. Performed car transfer with transfer board and modA for maintaining feet off floor, and assist to move LEs in/out of bed. Returned to bed at end of session as above; remained supine in bed, four bedrails up per pt request, all needs in reach.      Therapy Documentation Precautions:  Precautions Precautions: Fall Precaution Comments: CAM boot RLE (only able to be off for short period of time in bed) Required Braces or Orthoses: Other Brace/Splint Other Brace/Splint: CAM Boot Restrictions Weight Bearing Restrictions: Yes LUE Weight Bearing: Weight bearing as tolerated RLE Weight Bearing: Non weight bearing LLE Weight Bearing: Non weight bearing   See Function Navigator for Current Functional Status.   Therapy/Group: Individual Therapy  Vista Lawmanlizabeth J Tygielski 06/29/2017, 11:25 AM

## 2017-06-29 NOTE — Progress Notes (Signed)
Modified Barium Swallow Progress Note  Patient Details  Name: Briana [name] MRN: [id number] Date of Birth: 06/25/1995  Today's Date: 06/29/2017  Modified Barium Swallow completed.  Full report located under Chart Review in the Imaging Section.  Brief recommendations include the following:  Clinical Impression  Pt demonstrates much improved oropharyngeal abilities. Currently pt presents wtih reduced risk of aspiration when consuming regular with thin liquids via cup or straw. Pt with intermittent throat clear at baseline before study and during study but was not related to any aspiration. Pt with oral difficulty manipulating whole pill (perhaps d/t taste of barium tablet). Recommend upgrading diet to regular with thin liquids via cup or straw, medicine whole in puree and intermittent nursing supervision.    Swallow Evaluation Recommendations       SLP Diet Recommendations: Regular solids;Thin liquid   Liquid Administration via: Cup;Straw   Medication Administration: Whole meds with puree   Supervision: Intermittent supervision to cue for compensatory strategies;Patient able to self feed   Compensations: Minimize environmental distractions;Slow rate;Small sips/bites   Postural Changes: Remain semi-upright after after feeds/meals (Comment)   Oral Care Recommendations: Oral care BID        Chandria Rookstool 06/29/2017,12:59 PM

## 2017-06-30 ENCOUNTER — Inpatient Hospital Stay (HOSPITAL_COMMUNITY): Payer: Self-pay | Admitting: Physical Therapy

## 2017-06-30 ENCOUNTER — Inpatient Hospital Stay (HOSPITAL_COMMUNITY): Payer: Self-pay | Admitting: Occupational Therapy

## 2017-06-30 ENCOUNTER — Inpatient Hospital Stay (HOSPITAL_COMMUNITY): Payer: Self-pay

## 2017-06-30 NOTE — Progress Notes (Signed)
Physical Therapy Session Note  Patient Details  Name: [name] MRN: [id number] Date of Birth: 04/27/1996  Today's Date: 06/30/2017 PT Individual Time: 1300-1400 PT Individual Time Calculation (min): 60 min   Short Term Goals: Week 1:  PT Short Term Goal 1 (Week 1): Pt will perform bed mobility with S from flat bed PT Short Term Goal 2 (Week 1): Pt will perform transfer w/c <>bed with min guard and mod cues for technique PT Short Term Goal 3 (Week 1): Pt will initiate car transfer training PT Short Term Goal 4 (Week 1): Pt will initiate w/c propulsion  Skilled Therapeutic Interventions/Progress Updates: Pt received seated in w/c with CSW, sister and interpreter present. Denies pain and agreeable to treatment. W/c propulsion throughout session with S. Demonstrated to pt how to bump w/c up/down one curb step to simulate step into home. Plan to review again with family. Pt engaged in dynavision for focus on sustained attention, cognitive dual task, following commands. Performed various programs including red/green lights, t-scope on with one-digit numbers with simultaneous lights; demonstrates fast reaction speed <2.5 sec average. Most difficulty with t-scope dual task with pt recognizing and reciting 50% of numbers appearing during 2 min trial. W/c propulsion in community setting in gift shop with S overall; min cues for safety and consideration of others on elevator as pt attempting to turn around with several other people on elevator and not adequate space to perform. Performed transfer board w/c <>low couch and w/c <>elevated bed height with min guard and cues for maintaining NWB BLEs. Sit <>supine on flat bed with S and increased time to manage LEs. Remained seated in w/c at end of session, all needs in reach.      Therapy Documentation Precautions:  Precautions Precautions: Fall Precaution Comments: CAM boot RLE (only able to be off for short period of time in bed) Required Braces or  Orthoses: Other Brace/Splint Other Brace/Splint: CAM Boot Restrictions Weight Bearing Restrictions: Yes LUE Weight Bearing: Weight bearing as tolerated RLE Weight Bearing: Non weight bearing LLE Weight Bearing: Non weight bearing Pain: Pain Assessment Pain Assessment: No/denies pain  See Function Navigator for Current Functional Status.   Therapy/Group: Individual Therapy  Vista Lawmanlizabeth J Tygielski 06/30/2017, 4:21 PM

## 2017-06-30 NOTE — Progress Notes (Addendum)
Occupational Therapy Session Note  Patient Details  Name: Briana [name] MRN: [id number] Date of Birth: 02/02/1996  Today's Date: 06/30/2017 OT Individual Time:1000-1055 and 1400-1430 OT Individual Time Calculation (min): 60 min and 30 min    Short Term Goals: Week 1:  OT Short Term Goal 1 (Week 1): Pt will perform drop arm BSC with supervision OT Short Term Goal 2 (Week 1): Pt will perform LB dressing with supervision with lateral leans OT Short Term Goal 3 (Week 1): Pt will perform grooming with setup  OT Short Term Goal 4 (Week 1): Pt will be oriented x4 with min external cues  Skilled Therapeutic Interventions/Progress Updates:    Session 1: Pt presented sitting up in w/c with family and interpretor present, agreeable to OT tx session, no c/o pain. Pt declining bathing/dressing ADLs, focus of session on cognitive remediation, functional transfers, and UB strengthening. Pt completed puzzle seated in w/c, requiring increased time and max verbal/questioning cues for successful completion due to level of challenge. Pt practicing slide board transfer w/c<>drop arm BSC with Pt placing board and completing transfer with MinGuard, though requiring verbal cues for maintaining NWB status. Pt completed UB strengthening exercises using level 1 theraband across multiple planes for 10x each exercise. Pt left seated in w/c end of session, call bell and needs within reach, family present.   Session 2: Pt presents up in w/c ready for OT tx session, no c/o pain. Pt propels w/c modI throughout session to tx areas. Pt transfers w/c<>mat table with min verbal cues for managing w/c parts prior to transfer completion, MinA for LE management to maintain NWB during transfer completion. Seated in w/c Pt completes sequencing activity, completing activity without assist. Pt completed higher level challenge of matching/puzzle activity, requiring mod verbal cues to correctly match pieces. Pt returned to w/c and to room in  manner described above. Completes slide board transfer w/c>EOB with MinA for LE management and for safety as Pt transferring to slightly higher surface. Pt left supine in bed, bed alarm activated, call bell and needs within reach.   Therapy Documentation Precautions:  Precautions Precautions: Fall Precaution Comments: CAM boot RLE (only able to be off for short period of time in bed) Required Braces or Orthoses: Other Brace/Splint Other Brace/Splint: CAM Boot Restrictions Weight Bearing Restrictions: Yes LUE Weight Bearing: Weight bearing as tolerated RLE Weight Bearing: Non weight bearing LLE Weight Bearing: Non weight bearing   Vital Signs: Oxygen Therapy SpO2: 97 % O2 Device: Not Delivered Pain: Pain Assessment Pain Assessment: No/denies pain ADL: ADL ADL Comments: see functional naviagtor     See Function Navigator for Current Functional Status.   Therapy/Group: Individual Therapy  Orlando PennerBreanna L Mikolaj Woolstenhulme 06/30/2017, 12:58 PM
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
## 2017-06-30 NOTE — Progress Notes (Signed)
Physical Therapy Session Note  Patient Details  Name: Briana [name] MRN: [id number] Date of Birth: [birth year]  Today's Date: 06/30/2017 PT Individual Time: 0900-0950 PT Individual Time Calculation (min): 50 min   Short Term Goals: Week 1:  PT Short Term Goal 1 (Week 1): Pt will perform bed mobility with S from flat bed PT Short Term Goal 2 (Week 1): Pt will perform transfer w/c <>bed with min guard and mod cues for technique PT Short Term Goal 3 (Week 1): Pt will initiate car transfer training PT Short Term Goal 4 (Week 1): Pt will initiate w/c propulsion  Skilled Therapeutic Interventions/Progress Updates:   Pt supine and agreeable to therapy, no c/o pain at rest. Transferred to EOB w/ supervision and verbal cues for safety. Pt finishing eating grapes for breakfast prior to transferring to w/c via lateral scoot using slide board w/ min guard. Pt self-propelled w/c to/from gym w/o cues for direction. Encouraged her to use environmental clues to orient for direction. Transferred to edge of mat via same technique above and maintained static sitting at edge of mat for 30 minutes w/o increase in fatigue. Engaged in Wii game while seated w/ close supervision requiring quick reactions and trunk/postural control. Performed LE strengthening exercises during rest breaks including bilateral LAQs, multiple sets of 10, and bilateral heel slides, multiple sets of 10 w/ tan theraband resistance. No increase in pain w/ exercises and pt w/ notable increase in tolerable ROM this session compared to previous sessions. Returned to room in w/c and ended session in w/c and in care of sister, all needs met.   Therapy Documentation Precautions:  Precautions Precautions: Fall Precaution Comments: CAM boot RLE (only able to be off for short period of time in bed) Required Braces or Orthoses: Other Brace/Splint Other Brace/Splint: CAM Boot Restrictions Weight Bearing Restrictions: Yes LUE Weight Bearing: Weight  bearing as tolerated RLE Weight Bearing: Non weight bearing LLE Weight Bearing: Non weight bearing Pain: Pain Assessment Pain Assessment: No/denies pain  See Function Navigator for Current Functional Status.   Therapy/Group: Individual Therapy  Valery Amedee K Arnette 06/30/2017, 9:55 AM

## 2017-07-01 ENCOUNTER — Inpatient Hospital Stay (HOSPITAL_COMMUNITY): Payer: Self-pay | Admitting: Physical Therapy

## 2017-07-01 ENCOUNTER — Inpatient Hospital Stay (HOSPITAL_COMMUNITY): Payer: Self-pay | Admitting: Occupational Therapy

## 2017-07-01 ENCOUNTER — Encounter (HOSPITAL_COMMUNITY): Payer: Self-pay | Admitting: Psychology

## 2017-07-01 ENCOUNTER — Inpatient Hospital Stay (HOSPITAL_COMMUNITY): Payer: Self-pay

## 2017-07-01 LAB — CULTURE, RESPIRATORY: CULTURE: NORMAL

## 2017-07-01 LAB — CULTURE, RESPIRATORY W GRAM STAIN
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
## 2017-07-01 NOTE — Progress Notes (Signed)
Social Work Patient ID: Briana CellaErica [name], female   DOB: 04/14/1996, 22 y.o.   MRN: [id number]   Have reviewed team conference with pt and sister, [name], who are aware and very pleased with targeted d/c date of 2/1 and supervision wheelchair level goals.  Sister is agreed to be here tomorrow to complete ed with ST and will attempt true car transfers as well.  Neuropsychology able to consult with pt today.  Pt with general  medical questions and was able to have PA address these while interpreter was available.  Will continue to follow.  Hazen Brumett, LCSW

## 2017-07-01 NOTE — Progress Notes (Signed)
Physical Therapy Session Note  Patient Details  Name: Briana [name] MRN: [id number] Date of Birth: 09/04/1995  Today's Date: 07/01/2017 PT Individual Time:  -  0900-1000 60mins     Short Term Goals: Week 1:  PT Short Term Goal 1 (Week 1): Pt will perform bed mobility with S from flat bed PT Short Term Goal 2 (Week 1): Pt will perform transfer w/c <>bed with min guard and mod cues for technique PT Short Term Goal 3 (Week 1): Pt will initiate car transfer training PT Short Term Goal 4 (Week 1): Pt will initiate w/c propulsion  Skilled Therapeutic Interventions/Progress Updates:    Pt greeted supine in bed, agreeable to therapy, interpreter present throughout session. Transitions supine to sit with S, increased time to complete task, transfers bed<>WC using sliding board, manages sliding board, WC armrests and locks with S.  Pt propels WC 2x 300 ft using BUE, minimal vcing for directions and avoiding obstacles. Propels up/down 3% grade ramp with vcing to increase speed in order to get casters onto ramp, requires multiple trials to get all 4 wheels up onto ramp, requires S for safety. Pt performs 5 mins on arm bike with BUE, propels self back to room, transitions sit to supine with S, minA to scoot hips d/t NWB BLE. Performs HEP provided emphasizing BLE strength and ROM. At end of session, pt left supine in bed, denies any needs at this time.   Therapy Documentation Precautions:  Precautions Precautions: Fall Precaution Comments: CAM boot RLE (only able to be off for short period of time in bed) Required Braces or Orthoses: Other Brace/Splint Other Brace/Splint: CAM Boot Restrictions Weight Bearing Restrictions: Yes LUE Weight Bearing: Weight bearing as tolerated RLE Weight Bearing: Non weight bearing LLE Weight Bearing: Non weight bearing  See Function Navigator for Current Functional Status.   Therapy/Group: Individual Therapy  Theodosia QuayMorgan Eldwin Volkov 07/01/2017, 12:26 PM
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
## 2017-07-01 NOTE — Progress Notes (Signed)
Physical Therapy Session Note  Patient Details  Name: Briana [name] MRN: [id number] Date of Birth: 11/11/1995  Today's Date: 07/01/2017 PT Individual Time: 1400-1435 PT Individual Time Calculation (min): 35 min   Short Term Goals: Week 1:  PT Short Term Goal 1 (Week 1): Pt will perform bed mobility with S from flat bed PT Short Term Goal 2 (Week 1): Pt will perform transfer w/c <>bed with min guard and mod cues for technique PT Short Term Goal 3 (Week 1): Pt will initiate car transfer training PT Short Term Goal 4 (Week 1): Pt will initiate w/c propulsion  Skilled Therapeutic Interventions/Progress Updates:    Pt greeted supine in bed, interpreter present throughout session, agreeable to therapy, pt questioning about NWB status and therapy after D/C, educated pt on plan to refer to OPPT after f/u with surgeon and NWB status change. Pt transfers bed<>WC<>mat using sliding board with supervision, pt manages slide board and WC parts with verbal cues for sequencing. Pt engages in Wii baseball and tennis games emphasizing unsupported sitting balance for 20 minutes with no LOB. Pt propels WC 150 ft x 2 using BUE with no verbal cues for directions or safety. At end of session, pt transitions sitting to supine with minA for RLE lifting, pt left in supine with all needs within reach.  Therapy Documentation Precautions:  Precautions Precautions: Fall Precaution Comments: CAM boot RLE (only able to be off for short period of time in bed) Required Braces or Orthoses: Other Brace/Splint Other Brace/Splint: CAM Boot Restrictions Weight Bearing Restrictions: Yes LUE Weight Bearing: Weight bearing as tolerated RLE Weight Bearing: Non weight bearing LLE Weight Bearing: Non weight bearing Pain: Pain Assessment Pain Assessment: No/denies pain Pain Score: 0-No pain   See Function Navigator for Current Functional Status.   Therapy/Group: Individual Therapy  Theodosia QuayMorgan Suzetta Timko 07/01/2017, 4:17 PM
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
## 2017-07-01 NOTE — Progress Notes (Signed)
Occupational Therapy Session Note  Patient Details  Name: Briana [name] MRN: [id number] Date of Birth: 01/26/1996  Today's Date: 07/01/2017 OT Individual Time: 1000-1115 OT Individual Time Calculation (min): 75 min    Short Term Goals: Week 1:  OT Short Term Goal 1 (Week 1): Pt will perform drop arm BSC with supervision OT Short Term Goal 2 (Week 1): Pt will perform LB dressing with supervision with lateral leans OT Short Term Goal 3 (Week 1): Pt will perform grooming with setup  OT Short Term Goal 4 (Week 1): Pt will be oriented x4 with min external cues  Skilled Therapeutic Interventions/Progress Updates:    1:1 SElf care retraining at shower level in tub room. Focus on tub bench transfer with slide board and importance of material in between skin and board with transfers in and out of shower. Pt's right LE wrapped with cAM boot still donned. Pt able to bathe with lateral leans but required A for left LE. Pt dressed UB in shower and used slide board to come back into w/c to dress LB. Discussed wanting to go over education with sister tomorrow about A her with showers at home.    Therapy Documentation Precautions:  Precautions Precautions: Fall Precaution Comments: CAM boot RLE (only able to be off for short period of time in bed) Required Braces or Orthoses: Other Brace/Splint Other Brace/Splint: CAM Boot Restrictions Weight Bearing Restrictions: Yes LUE Weight Bearing: Weight bearing as tolerated RLE Weight Bearing: Non weight bearing LLE Weight Bearing: Non weight bearing Pain: Pain Assessment Pain Assessment: No/denies pain Pain Score: 0-No pain ADL: ADL ADL Comments: see functional naviagtor  See Function Navigator for Current Functional Status.   Therapy/Group: Individual Therapy  Roney MansSmith, Darsha Zumstein [facility] 07/01/2017, 3:23 PM
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
## 2017-07-02 ENCOUNTER — Inpatient Hospital Stay (HOSPITAL_COMMUNITY): Payer: No Typology Code available for payment source

## 2017-07-02 ENCOUNTER — Inpatient Hospital Stay (HOSPITAL_COMMUNITY): Payer: Self-pay | Admitting: Physical Therapy

## 2017-07-02 ENCOUNTER — Inpatient Hospital Stay (HOSPITAL_COMMUNITY): Payer: No Typology Code available for payment source | Admitting: Speech Pathology

## 2017-07-02 ENCOUNTER — Encounter (HOSPITAL_COMMUNITY): Payer: Self-pay | Admitting: Occupational Therapy

## 2017-07-02 ENCOUNTER — Encounter (HOSPITAL_COMMUNITY): Payer: Self-pay

## 2017-07-02 ENCOUNTER — Ambulatory Visit (HOSPITAL_COMMUNITY): Payer: Self-pay | Admitting: Physical Therapy

## 2017-07-02 ENCOUNTER — Inpatient Hospital Stay (HOSPITAL_COMMUNITY): Payer: Self-pay

## 2017-07-02 MED ORDER — ENOXAPARIN (LOVENOX) PATIENT EDUCATION KIT
PACK | Freq: Once | Status: AC
  Administered 2017-07-02: 12:00:00
  Filled 2017-07-02: qty 1

## 2017-07-02 NOTE — Progress Notes (Signed)
Occupational Therapy Note  Patient Details  Name: Briana [name] MRN: [id number] Date of Birth: 10/05/1995  Today's Date: 07/02/2017 OT Individual Time: [id number] OT Individual Time Calculation (min): 25 min   Pt denies pain Individual Therapy  Pt resting in w/c upon arrival with interpreter present.  OT intervention with focus slide board transfers to drop arm BSC at supervision level.  Pt simulated pulling down pants and pulling up pants with theraband using lateral leans-supervision level.  Pt performed slide board transfer back to bed and repositioned in bed.  Pt performed all tasks at supervision level.  Pt remained in bed with all needs within reach and bed alarm activated.    Lavone NeriLanier, Michaiah Holsopple [facility] 07/02/2017, 2:39 PM

## 2017-07-02 NOTE — Discharge Instructions (Signed)
Inpatient Rehab Discharge Instructions  Briana [name] Discharge date and time: No discharge date for patient encounter.   Activities/Precautions/ Functional Status: Activity: nonweightbearing bilateral lower extremities. Weightbearing as tolerated upper extremities Diet: regular diet Wound Care: keep wound clean and dry Functional status:  ___ No restrictions     ___ Walk up steps independently ___ 24/7 supervision/assistance   ___ Walk up steps with assistance ___ Intermittent supervision/assistance  ___ Bathe/dress independently ___ Walk with walker     _x__ Bathe/dress with assistance ___ Walk Independently    ___ Shower independently ___ Walk with assistance    ___ Shower with assistance ___ No alcohol     ___ Return to work/school ________    COMMUNITY REFERRALS UPON DISCHARGE:    Home Health:    RN                      Agency:  [facility] Phone: [phone]   Medical Equipment/Items Ordered:  Wheelchair, cushion, drop arm commode, tub bench and transfer board                                                      Agency/Supplier:  [facility] @ [phone]      Special Instructions: No alcohol   My questions have been answered and I understand these instructions. I will adhere to these goals and the provided educational materials after my discharge from the hospital.  Patient/Caregiver Signature _______________________________ Date __________  Clinician Signature _______________________________________ Date __________  Please bring this form and your medication list with you to all your follow-up doctor's appointments.

## 2017-07-02 NOTE — Discharge Summary (Signed)
Discharge summary job [id number]

## 2017-07-02 NOTE — Progress Notes (Signed)
Physical Therapy Discharge Summary  Patient Details  Name: Briana [name] MRN: [id number] Date of Birth: 07-18-1995  Today's Date: 07/02/2017 PT Individual Time: 1110-1150 PT Individual Time Calculation (min): 40 min  1300-1400 60 mins          156mns total   Patient has met 8 of 8 long term goals due to improved activity tolerance, improved balance, improved postural control, increased strength, decreased pain, ability to compensate for deficits, improved attention and improved awareness.  Patient to discharge at a wheelchair level Supervision.   Patient's care partner is independent to provide the necessary physical assistance at discharge.  Reasons goals not met: all goals met  Recommendation:  Patient will benefit from ongoing skilled PT services in outpatient setting after NWB status progressed to continue to advance safe functional mobility, address ongoing impairments in strength, ROM, neuromuscular control, and minimize fall risk.  Equipment: 16x16 manual wheelchair with basic cushion and elevating leg rests, transfer board  Reasons for discharge: treatment goals met  Patient/family agrees with progress made and goals achieved: Yes  PT Discharge Precautions/Restrictions Precautions Precautions: Fall Precaution Comments: CAM boot RLE (only able to be off for short period of time in bed) Restrictions Weight Bearing Restrictions: Yes LUE Weight Bearing: Weight bearing as tolerated RLE Weight Bearing: Non weight bearing LLE Weight Bearing: Non weight bearing Pain Pain Assessment Pain Assessment: No/denies pain Cognition Overall Cognitive Status: Within Functional Limits for tasks assessed Arousal/Alertness: Awake/alert Orientation Level: Oriented X4 Attention: Selective Sustained Attention: Appears intact Selective Attention: Appears intact Memory: Appears intact Awareness: Impaired Awareness Impairment: Anticipatory impairment Problem Solving: Appears  intact Safety/Judgment: Impaired Sensation Sensation Light Touch: Appears Intact Hot/Cold: Not tested Proprioception: Appears Intact Coordination Gross Motor Movements are Fluid and Coordinated: No Coordination and Movement Description: decr coordination in LEs Motor  Motor Motor - Skilled Clinical Observations: generalized weakness and pain  Mobility Bed Mobility Bed Mobility: Rolling Right;Rolling Left;Sit to Supine;Supine to Sit Rolling Right: 6: Modified independent (Device/Increase time) Rolling Left: 6: Modified independent (Device/Increase time) Supine to Sit: 6: Modified independent (Device/Increase time) Sit to Supine: 6: Modified independent (Device/Increase time) Transfers Transfers: Yes Lateral/Scoot Transfers: With slide board;5: Supervision Locomotion  Ambulation Ambulation: No Gait Gait: No Stairs / Additional Locomotion Stairs: No Wheelchair Mobility Wheelchair Mobility: Yes Wheelchair Assistance: 6: Modified independent (Device/Increase time) WEnvironmental health practitioner Both upper extremities Wheelchair Parts Management: Supervision/cueing(needs assistance with elevating legrests ) Distance: >500 ft  Trunk/Postural Assessment  Cervical Assessment Cervical Assessment: Within Functional Limits Thoracic Assessment Thoracic Assessment: Within Functional Limits Lumbar Assessment Lumbar Assessment: Within Functional Limits Postural Control Postural Control: Within Functional Limits  Balance Dynamic Sitting Balance Sitting balance - Comments: Pt able to maintain balance seated EOB for 15 minutes during dynamic activity with supervision Extremity Assessment      RLE Assessment RLE Assessment: Exceptions to [facility] Hospital[facility]LE PROM (degrees) RLE Overall PROM Comments: R hip flexion 75deg AROM 80 degrees PROM limited by pain, DF lacking 12deg from neutral partly d/t ACE wrap limited by pain Right Ankle Dorsiflexion: (lacking 12deg from neutral partly d/t ACE wrap ) RLE  Strength RLE Overall Strength: Deficits;Due to precautions;Due to pain(not formally tested d/t precuations, able to perform functional tasks) LLE Assessment LLE Assessment: Exceptions to WFL LLE PROM (degrees) Overall PROM Left Lower Extremity: Due to pain;Deficits;Due to precautions LLE Overall PROM Comments: L knee flexion WFL, hip AROM 90deg PROM 125deg with pain at end range, DF WFL  LLE Strength LLE Overall Strength: Deficits;Due to precautions;Due to pain  Tx 1: Pt greeted sitting upright in WC, sister-in-law, daughter, and niece present throughout session. Pt's daughter becomes fussy during session, pt able to attend to her appropriately.   Pt propels manual WC using BUE with supervision 300 ft x 2, therapist demonstrates car transfer to 22in height simulated car using slide board, pt's sister-in-law performs car transfer with minimal verbal cues for safety and placement, minA to lift RLE into car d/t tall floor boards. Therapist then demonstrates bumping pt up step to simulate getting into home environment, sister-in-law performs with minimal verbal cuing. Pt transfers WC>bed using sliding board with supervision, transitions sit>supine with minA to lift RLE, performs 2 x 10 reps of BLE exercises provided in HEP to ensure understanding and demonstrate for sister-in-law. Sister-in-law does not have any questions or concerns at this time, confirms that she will be at home to assist pt as needed. At end of session, pt left supine in bed with daughter, denies any needs at this time.   Tx 2: Pt greeted supine in bed, reports is very sleepy, lethargic throughout session however agreeable to participate, interpreter present throughout session. Pt transitions supine to sit with supervision, transfers bed<>WC<>mat using sliding board with supervision and intermittent vcing to maintain NWB precautions. AROM and PROM measured, results below. Performed prone and sidelying excercises 2 x 10reps. Pt propels  150 ft to ramp, propels up/down ramp with S for safety. Performs 7 mins on arm bike level 6 using BUE. Pt transported back to room, left sitting upright in WC at end of session, denies any needs at this time, denies any questions about discharge.   See Function Navigator for Current Functional Status.  Salvatore Decent 07/02/2017, 2:35 PM

## 2017-07-02 NOTE — Progress Notes (Signed)
Occupational Therapy Session Note  Patient Details  Name: Briana [name] MRN: [id number] Date of Birth: 10/27/1995  Today's Date: 07/02/2017 OT Individual Time: 1030-1100 OT Individual Time Calculation (min): 30 min    Short Term Goals: Week 1:  OT Short Term Goal 1 (Week 1): Pt will perform drop arm BSC with supervision OT Short Term Goal 2 (Week 1): Pt will perform LB dressing with supervision with lateral leans OT Short Term Goal 3 (Week 1): Pt will perform grooming with setup  OT Short Term Goal 4 (Week 1): Pt will be oriented x4 with min external cues Week 2:     Skilled Therapeutic Interventions/Progress Updates:    1:1 focus on family education with pt's sister on tub bench and BSC transfers with slide board, setup of DME, home safety recommendations.   Therapy Documentation Precautions:  Precautions Precautions: Fall Precaution Comments: CAM boot RLE (only able to be off for short period of time in bed) Required Braces or Orthoses: Other Brace/Splint Other Brace/Splint: CAM Boot Restrictions Weight Bearing Restrictions: Yes LUE Weight Bearing: Weight bearing as tolerated RLE Weight Bearing: Non weight bearing LLE Weight Bearing: Non weight bearing Pain: Pain Assessment Pain Assessment: No/denies pain Pain Score: 0-No pain ADL: ADL ADL Comments: see functional naviagtor  See Function Navigator for Current Functional Status.   Therapy/Group: Individual Therapy  Roney [name], Briana [facility] 07/02/2017, 3:59 PM

## 2017-07-02 NOTE — Progress Notes (Signed)
Speech Language Pathology Discharge Summary  Patient Details  Name: Briana [name] MRN: [id number] Date of Birth: 07-03-1995  Today's Date: 07/02/2017 SLP Individual Time: 1000-1030 SLP Individual Time Calculation (min): 30 min   Skilled Therapeutic Interventions:  Skilled treatment session focused on cognition goals and completion of education with pt and her sister in preparation for discharge on 07/03/17. Extensive time spent solving anticipatory situations such as needing to get out of bed in the middle of the night etc. Pt and sister were not prepared for such situations and envisioned that pt would remain in wheelchair but had not thought of bathroom or providing care for her daughter. All questions answered to pt and sister's satisfaction.     Patient has met 5 of 5 long term goals.  Patient to discharge at overall Supervision level.    Clinical Impression/Discharge Summary:   Pt has made good progress in ST sessions and as a result she as met 5 of 5 LTGs. Pt would benefit from supervision d/t nonweightbearing status and to provide care for young child as someone who is physically limited. No follow up ST services are indicated at this time.   Care Partner:  Caregiver Able to Provide Assistance: Yes  Type of Caregiver Assistance: Cognitive;Physical  Recommendation:  24 hour supervision/assistance      Equipment:     Reasons for discharge: Discharged from hospital;Treatment goals met   Patient/Family Agrees with Progress Made and Goals Achieved: Yes   Function:  Eating Eating                 Cognition Comprehension Comprehension assist level: Follows basic conversation/direction with no assist;Understands complex 90% of the time/cues 10% of the time  Expression   Expression assist level: Expresses complex 90% of the time/cues < 10% of the time;Expresses complex ideas: With extra time/assistive device  Social Interaction Social Interaction assist level: Interacts  appropriately with others with medication or extra time (anti-anxiety, antidepressant).  Problem Solving Problem solving assist level: Solves basic problems with no assist  Memory Memory assist level: Recognizes or recalls 90% of the time/requires cueing < 10% of the time   Felishia Wartman 07/02/2017, 1:43 PM

## 2017-07-02 NOTE — Progress Notes (Signed)
Sutures removed. Pt tolerated well. Pt in bed in low position with call bell in reach

## 2017-07-02 NOTE — Progress Notes (Signed)
Requested Lovenox kit from pharmacy

## 2017-07-02 NOTE — Progress Notes (Signed)
Physical Therapy Session Note  Patient Details  Name: Briana [name] MRN: [id number] Date of Birth: 06/08/1995  Today's Date: 07/02/2017 PT Individual Time: 0900-1000 PT Individual Time Calculation (min): 60 min   Short Term Goals: Week 1:  PT Short Term Goal 1 (Week 1): Pt will perform bed mobility with S from flat bed PT Short Term Goal 2 (Week 1): Pt will perform transfer w/c <>bed with min guard and mod cues for technique PT Short Term Goal 3 (Week 1): Pt will initiate car transfer training PT Short Term Goal 4 (Week 1): Pt will initiate w/c propulsion Week 2: = LTGs    Skilled Therapeutic Interventions/Progress Updates:  Pt demonstrated ability to verbalize to caregiver on how transfer from bed <> w/c and w/c <> car. Pt also verbalized understanding of NWB precautions B LE, while requiring min cueing to avoid pushing through L foot during car transfer.  Marland Kitchen. Pt propelled w/c to tx room where elevated car transfers were completed. Pt demonstrated S Assist with all transfers. Sister will pick up pt tomorrow in [employer] SUV; actual car transfer planned for later today with pt's sister. Pt verbalized understanding that CAM boot is to be worn on the R foot and educated on purpose of CAM boot wear for mobility and future function. Pt also advised that PA would provide duration/time of wear.   Neuro re-ed via tactile feedback, demo, multple VCs for bil 15 glute sets,  with fair ability to demonstrate isometric contraction.   Pt was left in w/c with call bell and phone within reach and SLP arriving, upon departure of PT.     Therapy Documentation Precautions:  Precautions Precautions: Fall Precaution Comments: CAM boot RLE (only able to be off for short period of time in bed) Required Braces or Orthoses: Other Brace/Splint Other Brace/Splint: CAM Boot Restrictions Weight Bearing Restrictions: Yes LUE Weight Bearing: Weight bearing as tolerated RLE Weight Bearing: Non weight bearing LLE Weight  Bearing: Non weight bearing  Pain: Pain Assessment Pain Assessment: No/denies pain Pain Score: 0-No pain  Locomotion : Ambulation Ambulation: No Gait Gait: No Stairs / Additional Locomotion Stairs: No Wheelchair Mobility Wheelchair Mobility: Yes Wheelchair Assistance: 5: Investment banker, operationalupervision Wheelchair Propulsion: Both upper extremities Wheelchair Parts Management: Supervision/cueing        See Function Navigator for Current Functional Status.   Therapy/Group: Individual Therapy  Zeynab Klett 07/02/2017, 12:19 PM

## 2017-07-02 NOTE — Progress Notes (Signed)
Social Work Patient ID: Nicole CellaErica [name], female   DOB: 11/23/1995, 22 y.o.   MRN: [id number]  Pt's sister, [name], in today to continue with family education.  Able to meet with all therapies and to practice true car transfers  (txs report sessions went well).  While here and interpreter available, I did review with them about the MATCH (medication assist) program and availability of the "[facility]" for [place] medical home in [place].  Nursing able to begin education on Lovenox injections and plans to review with them again tomorrow prior to d/c.  Pt eager to d/c home.  Finalizing all d/c arrangements and education.    Donaciano Range, LCSW
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
## 2017-07-02 NOTE — Progress Notes (Signed)
Lovenox kit provided to pt and sister with instructions for administration.  Teaching also provided.  Sister administered dose of Lovenox to pt without difficulty. Will reinforce teaching tomorrow before going home. 

## 2017-07-03 MED ORDER — ENOXAPARIN SODIUM 30 MG/0.3ML ~~LOC~~ SOLN: 30.0000 mg | Freq: Two times a day (BID) | SUBCUTANEOUS | 0 refills | Status: DC

## 2017-07-03 MED ORDER — METOPROLOL TARTRATE 25 MG PO TABS: 25.0000 mg | ORAL_TABLET | Freq: Two times a day (BID) | ORAL | 0 refills | Status: DC

## 2017-07-03 MED ORDER — DOCUSATE SODIUM 100 MG PO CAPS: 100.0000 mg | ORAL_CAPSULE | Freq: Two times a day (BID) | ORAL | 0 refills | Status: DC

## 2017-07-03 MED ORDER — QUETIAPINE FUMARATE 25 MG PO TABS: 25.0000 mg | ORAL_TABLET | Freq: Every day | ORAL | 0 refills | Status: DC

## 2017-07-03 MED ORDER — OXYCODONE HCL 5 MG PO TABS: 5.0000 mg | ORAL_TABLET | ORAL | 0 refills | Status: DC | PRN

## 2017-07-03 NOTE — Progress Notes (Signed)
Social Work  Discharge Note  The overall goal for the admission was met for:   Discharge location: Yes - home with sister/ family who will provide 24/7 assistance  Length of Stay: Yes - 9 days  Discharge activity level: Yes - supervision overall but does require light assistance with a few activities  Home/community participation: Yes  Services provided included: MD, RD, PT, OT, SLP, RN, TR, Pharmacy, Neuropsych and SW and interpreter  Financial Services: No insurance coverage for CIR or follow up services.  Anticipate she will qualify for emergency MA to cover acute stay.  Financial counselor with [facility] is following.  Follow-up services arranged: Home Health: RN via [place], DME: 16x16 lightweight w/c with ELRs, cushion, 30" transfer board, droparm commode and tub bench via [place] and Patient/Family has no preference for HH/DME agencies  Comments (or additional information): have alerted Dr. Doreatha Martin (via [place]) that only [facility] referred and asked that, if he deems appropriate in follow up appt, he might add PT visits when pt is allowed to [facility].  Pt also referred for [facility] (med assist program covering one month) and provided handout and verbal information about [facility] [place] that pt can access for ongoing medical home (also has onsite pharmacy).  Patient/Family verbalized understanding of follow-up arrangements: Yes  Individual responsible for coordination of the follow-up plan: pt/ sister assist  Confirmed correct DME delivered: Antasia Haider 07/03/2017    [name]

## 2017-07-03 NOTE — Progress Notes (Signed)
Occupational Therapy Discharge Summary  Patient Details  Name: Briana [name] MRN: [id number] Date of Birth: November 10, 1995 Patient has met 9 of 10 long term goals due to improved activity tolerance, improved balance, ability to compensate for deficits, improved attention, improved awareness and improved coordination.  Patient to discharge at overall Supervision level.  Patient's care partner is independent to provide the necessary physical and cognitive assistance at discharge.    Reasons goals not met: Pt at times still requires A for pulling up clothing (based on what clothing she wears) over her hips.  Recommendation:  Patient will benefit from ongoing skilled OT services in home health setting to continue to advance functional skills in the area of BADL and Reduce care partner burden.  Equipment: slide board, tub bench, drop arm commode  Reasons for discharge: treatment goals met and discharge from hospital  Patient/family agrees with progress made and goals achieved: Yes  OT Discharge Precautions/Restrictions  Precautions Precautions: Fall Precaution Comments: CAM boot RLE (only able to be off for short period of time in bed) Restrictions RLE Weight Bearing: Non weight bearing LLE Weight Bearing: Non weight bearing Pain Pain Assessment Pain Score: Asleep ADL ADL ADL Comments: see functional navigator Vision Baseline Vision/History: No visual deficits Vision Assessment?: No apparent visual deficits Perception  Perception: Within Functional Limits Praxis Praxis: Intact Cognition Overall Cognitive Status: Within Functional Limits for tasks assessed Arousal/Alertness: Awake/alert Orientation Level: Oriented X4 Attention: Selective Sustained Attention: Appears intact Selective Attention: Appears intact Selective Attention Impairment: Verbal basic;Functional basic Memory: Appears intact Memory Impairment: Decreased recall of new information Awareness: Impaired Awareness  Impairment: Anticipatory impairment Problem Solving: Appears intact Problem Solving Impairment: Functional basic Safety/Judgment: Impaired [employer] Scales of Cognitive Functioning: Purposeful/appropriate Sensation Sensation Light Touch: Appears Intact Stereognosis: Not tested Hot/Cold: Not tested Proprioception: Appears Intact Coordination Gross Motor Movements are Fluid and Coordinated: No Fine Motor Movements are Fluid and Coordinated: Yes Motor  Motor Motor - Skilled Clinical Observations: generalized weakness and pain Mobility  Bed Mobility Rolling Right: 6: Modified independent (Device/Increase time) Rolling Left: 6: Modified independent (Device/Increase time) Supine to Sit: 6: Modified independent (Device/Increase time) Sit to Supine: 6: Modified independent (Device/Increase time) Transfers Transfers: Not assessed  Trunk/Postural Assessment  Cervical Assessment Cervical Assessment: Within Functional Limits Thoracic Assessment Thoracic Assessment: Within Functional Limits Lumbar Assessment Lumbar Assessment: Within Functional Limits Postural Control Postural Control: Within Functional Limits  Balance Dynamic Sitting Balance Sitting balance - Comments: Mod I  Extremity/Trunk Assessment RUE Assessment RUE Assessment: Within Functional Limits LUE Assessment LUE Assessment: Within Functional Limits   See Function Navigator for Current Functional Status.  Willeen Cass [facility] 07/03/2017, 7:56 AM
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
## 2017-07-07 ENCOUNTER — Telehealth: Payer: Self-pay | Admitting: Registered Nurse

## 2017-07-07 ENCOUNTER — Telehealth: Payer: Self-pay

## 2017-07-07 NOTE — Telephone Encounter (Signed)
1st Attempt: Placed a call to [name], phone not working. Placed a call to emergency contact: [name] [phone].No answer left message.

## 2017-07-07 NOTE — Telephone Encounter (Signed)
Ve.rbal order given for initial PT evaluation visit. Physical therapist will call back with recommended visit schedule.

## 2017-07-07 NOTE — Telephone Encounter (Signed)
That would be fine 

## 2017-07-07 NOTE — Telephone Encounter (Signed)
[facility] requesting [facility] orders for nursing visits for 2xwk X 1wk then 1xwk X 4wks.   States patient also needs PT visits as well.  Called her back and approved verbal orders. Forwarding message to doctor for approval for PT visits.

## 2017-07-08 ENCOUNTER — Telehealth: Payer: Self-pay | Admitting: Registered Nurse

## 2017-07-08 ENCOUNTER — Ambulatory Visit: Payer: Self-pay

## 2017-07-08 NOTE — Telephone Encounter (Signed)
Transitional Care call Transitional Care Call Completed, Appointment Confirmed, Address Confirmed, New Patient Packet Mailed Transitional Care Questions Answered by Briana [name]  Patient name: Briana [name] DOB: 1995/11/03 1. Are you/is patient experiencing any problems since coming home? No a. Are there any questions regarding any aspect of care? No 2. Are there any questions regarding medications administration/dosing? No a. Are meds being taken as prescribed? Yes b. "Patient should review meds with caller to confirm" Medication List Reviewed: Discharge Medication Instructions has Ritalin 5 mg BID/ Not on Med List. PMP Aware Site Reviewed, no Ritalin noted. Spoke with Dr. Riley KillSwartz regarding the above. We will continue to monitor and evaluate.  3. Have there been any falls? No 4. Has Home Health been to the house and/or have they contacted you? Yes: [facility]: Scheduled for 07/08/2017 a. If not, have you tried to contact them? NA b. Can we help you contact them? NA 5. Are bowels and bladder emptying properly? Yes a. Are there any unexpected incontinence issues? No b. If applicable, is patient following bowel/bladder programs? NA 6. Any fevers, problems with breathing, unexpected pain? No 7. Are there any skin problems or new areas of breakdown? No 8. Has the patient/family member arranged specialty MD follow up (ie cardiology/neurology/renal/surgical/etc.)?  Yes a. Can we help arrange? NA 9. Does the patient need any other services or support that we can help arrange? No 10. Are caregivers following through as expected in assisting the patient? Yes 11. Has the patient quit smoking, drinking alcohol, or using drugs as recommended? Denies smoking, drinking alcohol or using illicit drugs.   Appointment date/time 07/14/2017, arrival time 11:00 for 11:20 appointment with [name] ANP-C. [phone]

## 2017-07-10 ENCOUNTER — Telehealth: Payer: Self-pay

## 2017-07-10 NOTE — Telephone Encounter (Signed)
Irving BurtonEmily PT [facility] requesting verbal orders for PT for 1xwk X 1wk then 2xwk X 1wk.  Called her back and approved verbal orders  Also Irving Burtonmily needs clarification on the weightbearing for this patient and what is the ROM for the effected areas being the ankle.  Please advise

## 2017-07-14 ENCOUNTER — Encounter: Payer: Self-pay | Attending: Registered Nurse | Admitting: Registered Nurse

## 2017-07-14 ENCOUNTER — Ambulatory Visit: Payer: Self-pay

## 2017-07-14 ENCOUNTER — Encounter: Payer: Self-pay | Admitting: Registered Nurse

## 2017-07-14 VITALS — BP 95/67 | HR 106

## 2017-07-14 DIAGNOSIS — S82142A Displaced bicondylar fracture of left tibia, initial encounter for closed fracture: Secondary | ICD-10-CM

## 2017-07-14 DIAGNOSIS — S82202A Unspecified fracture of shaft of left tibia, initial encounter for closed fracture: Secondary | ICD-10-CM | POA: Insufficient documentation

## 2017-07-14 DIAGNOSIS — R52 Pain, unspecified: Secondary | ICD-10-CM | POA: Insufficient documentation

## 2017-07-14 DIAGNOSIS — T07XXXA Unspecified multiple injuries, initial encounter: Secondary | ICD-10-CM

## 2017-07-14 DIAGNOSIS — Z7901 Long term (current) use of anticoagulants: Secondary | ICD-10-CM | POA: Insufficient documentation

## 2017-07-14 DIAGNOSIS — Z5181 Encounter for therapeutic drug level monitoring: Secondary | ICD-10-CM

## 2017-07-14 DIAGNOSIS — S062X1S Diffuse traumatic brain injury with loss of consciousness of 30 minutes or less, sequela: Secondary | ICD-10-CM

## 2017-07-14 DIAGNOSIS — R000 Tachycardia, unspecified: Secondary | ICD-10-CM | POA: Insufficient documentation

## 2017-07-14 DIAGNOSIS — X58XXXA Exposure to other specified factors, initial encounter: Secondary | ICD-10-CM | POA: Insufficient documentation

## 2017-07-14 DIAGNOSIS — S0291XA Unspecified fracture of skull, initial encounter for closed fracture: Secondary | ICD-10-CM | POA: Insufficient documentation

## 2017-07-14 DIAGNOSIS — Z79899 Other long term (current) drug therapy: Secondary | ICD-10-CM | POA: Insufficient documentation

## 2017-07-14 DIAGNOSIS — S069X0A Unspecified intracranial injury without loss of consciousness, initial encounter: Secondary | ICD-10-CM | POA: Insufficient documentation
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
## 2017-07-14 NOTE — Telephone Encounter (Signed)
Proceed with therapy. Pt is NWB bilaterally until seen by ortho

## 2017-07-15 DIAGNOSIS — S82142D Displaced bicondylar fracture of left tibia, subsequent encounter for closed fracture with routine healing: Secondary | ICD-10-CM

## 2017-07-15 DIAGNOSIS — S2243XD Multiple fractures of ribs, bilateral, subsequent encounter for fracture with routine healing: Secondary | ICD-10-CM

## 2017-07-15 DIAGNOSIS — S02113D Unspecified occipital condyle fracture, subsequent encounter for fracture with routine healing: Secondary | ICD-10-CM

## 2017-07-15 DIAGNOSIS — S42102D Fracture of unspecified part of scapula, left shoulder, subsequent encounter for fracture with routine healing: Secondary | ICD-10-CM

## 2017-07-15 DIAGNOSIS — F1010 Alcohol abuse, uncomplicated: Secondary | ICD-10-CM

## 2017-07-15 DIAGNOSIS — S3289XD Fracture of other parts of pelvis, subsequent encounter for fracture with routine healing: Secondary | ICD-10-CM

## 2017-07-15 DIAGNOSIS — S36116D Major laceration of liver, subsequent encounter: Secondary | ICD-10-CM

## 2017-07-15 DIAGNOSIS — S82201D Unspecified fracture of shaft of right tibia, subsequent encounter for closed fracture with routine healing: Secondary | ICD-10-CM

## 2017-07-15 DIAGNOSIS — S0291XD Unspecified fracture of skull, subsequent encounter for fracture with routine healing: Secondary | ICD-10-CM

## 2017-07-15 DIAGNOSIS — S82871D Displaced pilon fracture of right tibia, subsequent encounter for closed fracture with routine healing: Secondary | ICD-10-CM

## 2017-07-15 DIAGNOSIS — S06300D Unspecified focal traumatic brain injury without loss of consciousness, subsequent encounter: Secondary | ICD-10-CM

## 2017-07-15 DIAGNOSIS — R1310 Dysphagia, unspecified: Secondary | ICD-10-CM

## 2017-07-30 ENCOUNTER — Telehealth: Payer: Self-pay

## 2017-07-30 ENCOUNTER — Other Ambulatory Visit: Payer: Self-pay

## 2017-07-30 MED ORDER — METOPROLOL TARTRATE 25 MG PO TABS: 25.0000 mg | ORAL_TABLET | Freq: Two times a day (BID) | ORAL | 0 refills | Status: DC

## 2017-07-30 NOTE — Telephone Encounter (Signed)
Bethann Berkshirerisha RN [facility] called asking if we would be ok to do 1 more refill for this patients blood pressure medication, last refill was done on 07-03-17 by discharging provider during hospital.  Patient is within the 1 month window for refill medication, reordered with advise to seek out a primary care provider for any further refills.  Called and notified Bethann Berkshirerisha and patient.  Bethann Berkshirerisha also Is requesting a Child psychotherapistsocial worker for this patient as she has multiple medical problems and no insurance and is still having problems trying to find a primary care provider as she is being turned away form certain clinics due to the financial problems, I gave her the verbal order approval and told her to fax whatever paperwork she needs to be signed.

## 2017-08-19 ENCOUNTER — Ambulatory Visit: Payer: Self-pay | Admitting: Student

## 2017-08-19 ENCOUNTER — Encounter: Payer: Self-pay | Attending: Registered Nurse | Admitting: Physical Medicine & Rehabilitation

## 2017-08-19 ENCOUNTER — Encounter: Payer: Self-pay | Admitting: Physical Medicine & Rehabilitation

## 2017-08-19 VITALS — BP 90/61 | HR 116

## 2017-08-19 DIAGNOSIS — S82202A Unspecified fracture of shaft of left tibia, initial encounter for closed fracture: Secondary | ICD-10-CM | POA: Insufficient documentation

## 2017-08-19 DIAGNOSIS — R000 Tachycardia, unspecified: Secondary | ICD-10-CM | POA: Insufficient documentation

## 2017-08-19 DIAGNOSIS — S82261N Displaced segmental fracture of shaft of right tibia, subsequent encounter for open fracture type IIIA, IIIB, or IIIC with nonunion: Secondary | ICD-10-CM

## 2017-08-19 DIAGNOSIS — Z79899 Other long term (current) drug therapy: Secondary | ICD-10-CM | POA: Insufficient documentation

## 2017-08-19 DIAGNOSIS — S82142A Displaced bicondylar fracture of left tibia, initial encounter for closed fracture: Secondary | ICD-10-CM

## 2017-08-19 DIAGNOSIS — S062X1S Diffuse traumatic brain injury with loss of consciousness of 30 minutes or less, sequela: Secondary | ICD-10-CM

## 2017-08-19 DIAGNOSIS — S0291XA Unspecified fracture of skull, initial encounter for closed fracture: Secondary | ICD-10-CM | POA: Insufficient documentation

## 2017-08-19 DIAGNOSIS — Z7901 Long term (current) use of anticoagulants: Secondary | ICD-10-CM | POA: Insufficient documentation

## 2017-08-19 DIAGNOSIS — S069X0A Unspecified intracranial injury without loss of consciousness, initial encounter: Secondary | ICD-10-CM | POA: Insufficient documentation

## 2017-08-19 DIAGNOSIS — R52 Pain, unspecified: Secondary | ICD-10-CM | POA: Insufficient documentation
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
## 2017-08-19 NOTE — Patient Instructions (Signed)
PLEASE FEEL FREE TO CALL OUR OFFICE WITH ANY PROBLEMS OR QUESTIONS ([phone])      

## 2017-08-25 ENCOUNTER — Encounter (HOSPITAL_COMMUNITY): Payer: Self-pay | Admitting: *Deleted
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
## 2017-08-26 ENCOUNTER — Inpatient Hospital Stay (HOSPITAL_COMMUNITY): Payer: Self-pay | Admitting: Anesthesiology

## 2017-08-26 ENCOUNTER — Inpatient Hospital Stay (HOSPITAL_COMMUNITY)
Admission: RE | Admit: 2017-08-26 | Discharge: 2017-08-28 | DRG: 493 | Disposition: A | Discharge status: Home Health | Payer: Self-pay | Source: Ambulatory Visit | Attending: Student | Admitting: Student

## 2017-08-26 ENCOUNTER — Encounter (HOSPITAL_COMMUNITY)
Admission: RE | Disposition: A | Discharge status: Home Health | Payer: Self-pay | Source: Ambulatory Visit | Attending: Student

## 2017-08-26 ENCOUNTER — Other Ambulatory Visit: Payer: Self-pay

## 2017-08-26 ENCOUNTER — Encounter (HOSPITAL_COMMUNITY): Payer: Self-pay | Admitting: *Deleted

## 2017-08-26 ENCOUNTER — Inpatient Hospital Stay (HOSPITAL_COMMUNITY): Payer: Self-pay

## 2017-08-26 DIAGNOSIS — Z419 Encounter for procedure for purposes other than remedying health state, unspecified: Secondary | ICD-10-CM

## 2017-08-26 DIAGNOSIS — Z8669 Personal history of other diseases of the nervous system and sense organs: Secondary | ICD-10-CM

## 2017-08-26 DIAGNOSIS — T148XXA Other injury of unspecified body region, initial encounter: Secondary | ICD-10-CM

## 2017-08-26 DIAGNOSIS — S82261N Displaced segmental fracture of shaft of right tibia, subsequent encounter for open fracture type IIIA, IIIB, or IIIC with nonunion: Secondary | ICD-10-CM | POA: Insufficient documentation

## 2017-08-26 DIAGNOSIS — Z79899 Other long term (current) drug therapy: Secondary | ICD-10-CM

## 2017-08-26 DIAGNOSIS — S82201K Unspecified fracture of shaft of right tibia, subsequent encounter for closed fracture with nonunion: Principal | ICD-10-CM

## 2017-08-26 DIAGNOSIS — T1490XD Injury, unspecified, subsequent encounter: Secondary | ICD-10-CM

## 2017-08-26 DIAGNOSIS — R000 Tachycardia, unspecified: Secondary | ICD-10-CM | POA: Diagnosis present

## 2017-08-26 DIAGNOSIS — Z23 Encounter for immunization: Secondary | ICD-10-CM

## 2017-08-26 DIAGNOSIS — I10 Essential (primary) hypertension: Secondary | ICD-10-CM | POA: Diagnosis present

## 2017-08-26 DIAGNOSIS — X58XXXD Exposure to other specified factors, subsequent encounter: Secondary | ICD-10-CM | POA: Diagnosis present

## 2017-08-26 DIAGNOSIS — D62 Acute posthemorrhagic anemia: Secondary | ICD-10-CM | POA: Diagnosis not present

## 2017-08-26 DIAGNOSIS — Z87820 Personal history of traumatic brain injury: Secondary | ICD-10-CM

## 2017-08-26 DIAGNOSIS — S82251N Displaced comminuted fracture of shaft of right tibia, subsequent encounter for open fracture type IIIA, IIIB, or IIIC with nonunion: Secondary | ICD-10-CM | POA: Diagnosis present

## 2017-08-26 HISTORY — PX: TIBIA FRACTURE SURGERY: SHX806

## 2017-08-26 HISTORY — DX: Essential (primary) hypertension: I10

## 2017-08-26 HISTORY — PX: HARVEST BONE GRAFT: SHX377

## 2017-08-26 HISTORY — DX: Cardiac arrhythmia, unspecified: I49.9

## 2017-08-26 HISTORY — DX: Unspecified convulsions: R56.9

## 2017-08-26 HISTORY — DX: Personal history of other medical treatment: Z92.89

## 2017-08-26 HISTORY — PX: TIBIA IM NAIL INSERTION: SHX2516

## 2017-08-26 HISTORY — DX: Unspecified intracranial injury with loss of consciousness of unspecified duration, initial encounter: S06.9X9A

## 2017-08-26 HISTORY — DX: Laceration of liver, unspecified degree, initial encounter: S36.113A

## 2017-08-26 HISTORY — DX: Fracture of unspecified part of scapula, unspecified shoulder, initial encounter for closed fracture: S42.109A

## 2017-08-26 HISTORY — DX: Multiple fractures of ribs, bilateral, initial encounter for closed fracture: S22.43XA

## 2017-08-26 LAB — BASIC METABOLIC PANEL
ANION GAP: 9 (ref 5–15)
BUN: 6 mg/dL (ref 6–20)
CALCIUM: 9.7 mg/dL (ref 8.9–10.3)
CO2: 24 mmol/L (ref 22–32)
Chloride: 106 mmol/L (ref 101–111)
Creatinine, Ser: 0.42 mg/dL — ABNORMAL LOW (ref 0.44–1.00)
Glucose, Bld: 102 mg/dL — ABNORMAL HIGH (ref 65–99)
Potassium: 4.4 mmol/L (ref 3.5–5.1)
SODIUM: 139 mmol/L (ref 135–145)

## 2017-08-26 LAB — CBC
HEMATOCRIT: 39.2 % (ref 36.0–46.0)
Hemoglobin: 12.9 g/dL (ref 12.0–15.0)
MCH: 29.4 pg (ref 26.0–34.0)
MCHC: 32.9 g/dL (ref 30.0–36.0)
MCV: 89.3 fL (ref 78.0–100.0)
Platelets: 280 10*3/uL (ref 150–400)
RBC: 4.39 MIL/uL (ref 3.87–5.11)
RDW: 13.1 % (ref 11.5–15.5)
WBC: 8.4 10*3/uL (ref 4.0–10.5)

## 2017-08-26 LAB — HCG, SERUM, QUALITATIVE: PREG SERUM: NEGATIVE

## 2017-08-26 SURGERY — INSERTION, INTRAMEDULLARY ROD, TIBIA
Anesthesia: General | Laterality: Right

## 2017-08-26 MED ORDER — ENSURE ENLIVE PO LIQD
237.0000 mL | Freq: Two times a day (BID) | ORAL | Status: DC
  Administered 2017-08-27: 237 mL via ORAL

## 2017-08-26 MED ORDER — LACTATED RINGERS IV SOLN
INTRAVENOUS | Status: DC
  Administered 2017-08-26 – 2017-08-27 (×4): via INTRAVENOUS

## 2017-08-26 MED ORDER — SODIUM CHLORIDE 0.9 % IR SOLN
Status: DC | PRN
  Administered 2017-08-26: 3000 mL

## 2017-08-26 MED ORDER — ARTIFICIAL TEARS OPHTHALMIC OINT
TOPICAL_OINTMENT | OPHTHALMIC | Status: DC | PRN
  Administered 2017-08-26: 1 via OPHTHALMIC

## 2017-08-26 MED ORDER — MORPHINE SULFATE (PF) 4 MG/ML IV SOLN: 2.0000 mg | INTRAVENOUS | Status: DC | PRN

## 2017-08-26 MED ORDER — CEFAZOLIN SODIUM-DEXTROSE 2-4 GM/100ML-% IV SOLN
2.0000 g | Freq: Three times a day (TID) | INTRAVENOUS | Status: AC
  Administered 2017-08-26 – 2017-08-27 (×3): 2 g via INTRAVENOUS
  Filled 2017-08-26 (×3): qty 100

## 2017-08-26 MED ORDER — ENOXAPARIN SODIUM 40 MG/0.4ML ~~LOC~~ SOLN
40.0000 mg | SUBCUTANEOUS | Status: DC
  Administered 2017-08-27 – 2017-08-28 (×2): 40 mg via SUBCUTANEOUS
  Filled 2017-08-26 (×2): qty 0.4

## 2017-08-26 MED ORDER — PROPOFOL 10 MG/ML IV BOLUS
INTRAVENOUS | Status: AC
  Filled 2017-08-26: qty 40

## 2017-08-26 MED ORDER — LIDOCAINE HCL (CARDIAC) 20 MG/ML IV SOLN
INTRAVENOUS | Status: DC | PRN
  Administered 2017-08-26: 50 mg via INTRAVENOUS

## 2017-08-26 MED ORDER — METHOCARBAMOL 500 MG PO TABS
ORAL_TABLET | ORAL | Status: AC
  Administered 2017-08-26: 500 mg via ORAL
  Filled 2017-08-26: qty 1

## 2017-08-26 MED ORDER — INFLUENZA VAC SPLIT QUAD 0.5 ML IM SUSY
0.5000 mL | PREFILLED_SYRINGE | INTRAMUSCULAR | Status: AC
  Administered 2017-08-27: 0.5 mL via INTRAMUSCULAR
  Filled 2017-08-26: qty 0.5

## 2017-08-26 MED ORDER — OXYCODONE HCL 5 MG PO TABS
5.0000 mg | ORAL_TABLET | ORAL | Status: DC | PRN
  Administered 2017-08-26: 5 mg via ORAL
  Administered 2017-08-26 – 2017-08-27 (×2): 10 mg via ORAL
  Administered 2017-08-27: 5 mg via ORAL
  Filled 2017-08-26 (×2): qty 2
  Filled 2017-08-26: qty 1
  Filled 2017-08-26: qty 2

## 2017-08-26 MED ORDER — ONDANSETRON HCL 4 MG/2ML IJ SOLN
INTRAMUSCULAR | Status: DC | PRN
  Administered 2017-08-26: 4 mg via INTRAVENOUS

## 2017-08-26 MED ORDER — ROCURONIUM BROMIDE 100 MG/10ML IV SOLN
INTRAVENOUS | Status: DC | PRN
  Administered 2017-08-26: 30 mg via INTRAVENOUS
  Administered 2017-08-26: 10 mg via INTRAVENOUS
  Administered 2017-08-26: 20 mg via INTRAVENOUS
  Administered 2017-08-26: 30 mg via INTRAVENOUS
  Administered 2017-08-26: 10 mg via INTRAVENOUS

## 2017-08-26 MED ORDER — DEXTROSE 5 % IV SOLN
500.0000 mg | Freq: Four times a day (QID) | INTRAVENOUS | Status: DC | PRN
  Filled 2017-08-26: qty 5

## 2017-08-26 MED ORDER — ACETAMINOPHEN 650 MG RE SUPP: 650.0000 mg | Freq: Four times a day (QID) | RECTAL | Status: DC | PRN

## 2017-08-26 MED ORDER — FENTANYL CITRATE (PF) 100 MCG/2ML IJ SOLN
25.0000 ug | INTRAMUSCULAR | Status: DC | PRN
  Administered 2017-08-26 (×2): 50 ug via INTRAVENOUS

## 2017-08-26 MED ORDER — FENTANYL CITRATE (PF) 100 MCG/2ML IJ SOLN
INTRAMUSCULAR | Status: AC
  Administered 2017-08-26: 50 ug via INTRAVENOUS
  Filled 2017-08-26: qty 2

## 2017-08-26 MED ORDER — OXYCODONE HCL 5 MG PO TABS
ORAL_TABLET | ORAL | Status: AC
  Administered 2017-08-26: 5 mg via ORAL
  Filled 2017-08-26: qty 1

## 2017-08-26 MED ORDER — ROCURONIUM BROMIDE 10 MG/ML (PF) SYRINGE
PREFILLED_SYRINGE | INTRAVENOUS | Status: AC
  Filled 2017-08-26: qty 15

## 2017-08-26 MED ORDER — ESMOLOL HCL 100 MG/10ML IV SOLN
INTRAVENOUS | Status: AC
  Filled 2017-08-26: qty 10

## 2017-08-26 MED ORDER — TOBRAMYCIN SULFATE 1.2 G IJ SOLR
INTRAMUSCULAR | Status: DC | PRN
  Administered 2017-08-26: 1.2 g via TOPICAL

## 2017-08-26 MED ORDER — FENTANYL CITRATE (PF) 100 MCG/2ML IJ SOLN
INTRAMUSCULAR | Status: DC | PRN
  Administered 2017-08-26 (×7): 50 ug via INTRAVENOUS

## 2017-08-26 MED ORDER — DIPHENHYDRAMINE HCL 12.5 MG/5ML PO ELIX: 12.5000 mg | ORAL_SOLUTION | ORAL | Status: DC | PRN

## 2017-08-26 MED ORDER — 0.9 % SODIUM CHLORIDE (POUR BTL) OPTIME
TOPICAL | Status: DC | PRN
  Administered 2017-08-26: 1000 mL
  Administered 2017-08-26: 150 mL

## 2017-08-26 MED ORDER — ONDANSETRON HCL 4 MG PO TABS: 4.0000 mg | ORAL_TABLET | Freq: Four times a day (QID) | ORAL | Status: DC | PRN

## 2017-08-26 MED ORDER — ALBUMIN HUMAN 5 % IV SOLN
INTRAVENOUS | Status: DC | PRN
  Administered 2017-08-26: 10:00:00 via INTRAVENOUS

## 2017-08-26 MED ORDER — TOBRAMYCIN SULFATE 1.2 G IJ SOLR
INTRAMUSCULAR | Status: AC
  Filled 2017-08-26: qty 1.2

## 2017-08-26 MED ORDER — DEXAMETHASONE SODIUM PHOSPHATE 10 MG/ML IJ SOLN
INTRAMUSCULAR | Status: DC | PRN
  Administered 2017-08-26: 5 mg via INTRAVENOUS

## 2017-08-26 MED ORDER — CEFAZOLIN SODIUM-DEXTROSE 2-4 GM/100ML-% IV SOLN
2.0000 g | INTRAVENOUS | Status: AC
  Administered 2017-08-26: 2 g via INTRAVENOUS
  Filled 2017-08-26: qty 100

## 2017-08-26 MED ORDER — MIDAZOLAM HCL 2 MG/2ML IJ SOLN
INTRAMUSCULAR | Status: AC
  Filled 2017-08-26: qty 2

## 2017-08-26 MED ORDER — VANCOMYCIN HCL 1000 MG IV SOLR
INTRAVENOUS | Status: AC
  Filled 2017-08-26: qty 1000

## 2017-08-26 MED ORDER — ACETAMINOPHEN 325 MG PO TABS: 650.0000 mg | ORAL_TABLET | Freq: Four times a day (QID) | ORAL | Status: DC | PRN

## 2017-08-26 MED ORDER — VANCOMYCIN HCL 1000 MG IV SOLR
INTRAVENOUS | Status: DC | PRN
  Administered 2017-08-26: 1000 mg via TOPICAL

## 2017-08-26 MED ORDER — ARTIFICIAL TEARS OPHTHALMIC OINT
TOPICAL_OINTMENT | OPHTHALMIC | Status: AC
  Filled 2017-08-26: qty 3.5

## 2017-08-26 MED ORDER — CHLORHEXIDINE GLUCONATE 4 % EX LIQD: 60.0000 mL | Freq: Once | CUTANEOUS | Status: DC

## 2017-08-26 MED ORDER — DEXAMETHASONE SODIUM PHOSPHATE 10 MG/ML IJ SOLN
INTRAMUSCULAR | Status: AC
  Filled 2017-08-26: qty 2

## 2017-08-26 MED ORDER — SUGAMMADEX SODIUM 200 MG/2ML IV SOLN
INTRAVENOUS | Status: AC
  Filled 2017-08-26: qty 2

## 2017-08-26 MED ORDER — FENTANYL CITRATE (PF) 250 MCG/5ML IJ SOLN
INTRAMUSCULAR | Status: AC
  Filled 2017-08-26: qty 5

## 2017-08-26 MED ORDER — SUGAMMADEX SODIUM 200 MG/2ML IV SOLN
INTRAVENOUS | Status: DC | PRN
  Administered 2017-08-26: 100 mg via INTRAVENOUS

## 2017-08-26 MED ORDER — ESMOLOL HCL 100 MG/10ML IV SOLN
INTRAVENOUS | Status: DC | PRN
  Administered 2017-08-26: 10 mg via INTRAVENOUS
  Administered 2017-08-26 (×2): 20 mg via INTRAVENOUS
  Administered 2017-08-26 (×3): 10 mg via INTRAVENOUS
  Administered 2017-08-26: 20 mg via INTRAVENOUS

## 2017-08-26 MED ORDER — METHOCARBAMOL 500 MG PO TABS
500.0000 mg | ORAL_TABLET | Freq: Four times a day (QID) | ORAL | Status: DC | PRN
  Administered 2017-08-26 – 2017-08-27 (×4): 500 mg via ORAL
  Filled 2017-08-26 (×4): qty 1

## 2017-08-26 MED ORDER — ONDANSETRON HCL 4 MG/2ML IJ SOLN: 4.0000 mg | Freq: Four times a day (QID) | INTRAMUSCULAR | Status: DC | PRN

## 2017-08-26 MED ORDER — ONDANSETRON HCL 4 MG/2ML IJ SOLN
INTRAMUSCULAR | Status: AC
  Filled 2017-08-26: qty 2

## 2017-08-26 MED ORDER — PROMETHAZINE HCL 25 MG/ML IJ SOLN: 6.2500 mg | INTRAMUSCULAR | Status: DC | PRN

## 2017-08-26 MED ORDER — PROPOFOL 10 MG/ML IV BOLUS
INTRAVENOUS | Status: DC | PRN
  Administered 2017-08-26: 120 mg via INTRAVENOUS

## 2017-08-26 MED ORDER — LIDOCAINE HCL (CARDIAC) 20 MG/ML IV SOLN
INTRAVENOUS | Status: AC
  Filled 2017-08-26: qty 5

## 2017-08-26 SURGICAL SUPPLY — 76 items
BANDAGE ACE 4X5 VEL STRL LF (GAUZE/BANDAGES/DRESSINGS) ×4 IMPLANT
BANDAGE ACE 6X5 VEL STRL LF (GAUZE/BANDAGES/DRESSINGS) ×4 IMPLANT
BANDAGE ELASTIC 6 VELCRO ST LF (GAUZE/BANDAGES/DRESSINGS) ×8 IMPLANT
BIT DRILL CALIBRATED 4.2 (BIT) ×2 IMPLANT
BIT DRILL SHORT 4.2 (BIT) ×4 IMPLANT
BLADE SURG 10 STRL SS (BLADE) ×8 IMPLANT
BNDG COHESIVE 4X5 TAN STRL (GAUZE/BANDAGES/DRESSINGS) ×4 IMPLANT
BNDG COHESIVE 6X5 TAN STRL LF (GAUZE/BANDAGES/DRESSINGS) ×4 IMPLANT
BNDG GAUZE ELAST 4 BULKY (GAUZE/BANDAGES/DRESSINGS) ×4 IMPLANT
BONE CANC CHIPS 40CC CAN1/2 (Bone Implant) ×4 IMPLANT
BONE VIVIGEN FORMABLE 5.4CC (Bone Implant) ×4 IMPLANT
BRUSH SCRUB SURG 4.25 DISP (MISCELLANEOUS) ×8 IMPLANT
CANISTER WOUND CARE 500ML ATS (WOUND CARE) ×4 IMPLANT
CHIPS CANC BONE 40CC CAN1/2 (Bone Implant) ×2 IMPLANT
CHLORAPREP W/TINT 26ML (MISCELLANEOUS) ×4 IMPLANT
CLIP LOCKING FOR RIA (CLIP) ×4 IMPLANT
COVER SURGICAL LIGHT HANDLE (MISCELLANEOUS) ×8 IMPLANT
DRAPE C-ARM 42X72 X-RAY (DRAPES) ×4 IMPLANT
DRAPE C-ARMOR (DRAPES) ×4 IMPLANT
DRAPE EXTREMITY BILATERAL (DRAPES) ×4 IMPLANT
DRAPE HALF SHEET 40X57 (DRAPES) ×12 IMPLANT
DRAPE IMP U-DRAPE 54X76 (DRAPES) ×8 IMPLANT
DRAPE INCISE IOBAN 66X45 STRL (DRAPES) IMPLANT
DRAPE ORTHO SPLIT 77X108 STRL (DRAPES) ×4
DRAPE SURG ORHT 6 SPLT 77X108 (DRAPES) ×4 IMPLANT
DRAPE U-SHAPE 47X51 STRL (DRAPES) ×4 IMPLANT
DRILL BIT CALIBRATED 4.2 (BIT) ×4
DRILL BIT SHORT 4.2 (BIT) ×4
DRIVE SHAFT SEAL STERILE ×4 IMPLANT
DRSG ADAPTIC 3X8 NADH LF (GAUZE/BANDAGES/DRESSINGS) ×4 IMPLANT
DRSG MEPILEX BORDER 4X4 (GAUZE/BANDAGES/DRESSINGS) ×4 IMPLANT
ELECT REM PT RETURN 9FT ADLT (ELECTROSURGICAL) ×4
ELECTRODE REM PT RTRN 9FT ADLT (ELECTROSURGICAL) ×2 IMPLANT
GAUZE SPONGE 4X4 12PLY STRL (GAUZE/BANDAGES/DRESSINGS) ×4 IMPLANT
GAUZE SPONGE 4X4 12PLY STRL LF (GAUZE/BANDAGES/DRESSINGS) ×4 IMPLANT
GLOVE BIO SURGEON STRL SZ7.5 (GLOVE) ×16 IMPLANT
GLOVE BIOGEL PI IND STRL 7.5 (GLOVE) ×2 IMPLANT
GLOVE BIOGEL PI INDICATOR 7.5 (GLOVE) ×2
GOWN STRL REUS W/ TWL LRG LVL3 (GOWN DISPOSABLE) ×4 IMPLANT
GOWN STRL REUS W/TWL LRG LVL3 (GOWN DISPOSABLE) ×4
GRAFT FILTER FOR RIA 520 LGTH (MISCELLANEOUS) ×4 IMPLANT
GUIDEWIRE 3.2X400 (WIRE) ×4 IMPLANT
HEAD REAMER 12MM (Head) ×4 IMPLANT
KIT BASIN OR (CUSTOM PROCEDURE TRAY) ×4 IMPLANT
KIT PREVENA INCISION MGT 13 (CANNISTER) ×4 IMPLANT
KIT TURNOVER KIT B (KITS) ×4 IMPLANT
NAIL TIB 10X300 (Nail) ×4 IMPLANT
NS IRRIG 1000ML POUR BTL (IV SOLUTION) ×4 IMPLANT
PACK TOTAL JOINT (CUSTOM PROCEDURE TRAY) ×4 IMPLANT
PAD ARMBOARD 7.5X6 YLW CONV (MISCELLANEOUS) ×8 IMPLANT
PAD CAST 4YDX4 CTTN HI CHSV (CAST SUPPLIES) ×4 IMPLANT
PADDING CAST COTTON 4X4 STRL (CAST SUPPLIES) ×4
REAMER ROD DEEP FLUTE 2.5X950 (INSTRUMENTS) ×4 IMPLANT
SCREW LOCK STAR 5X30 (Screw) ×4 IMPLANT
SCREW LOCK STAR 5X34 (Screw) ×8 IMPLANT
SCREW LOCK STAR 5X54 (Screw) ×4 IMPLANT
SET CYSTO W/LG BORE CLAMP LF (SET/KITS/TRAYS/PACK) ×4 IMPLANT
SPONGE LAP 18X18 X RAY DECT (DISPOSABLE) ×4 IMPLANT
STAPLER VISISTAT 35W (STAPLE) ×4 IMPLANT
STOCKINETTE IMPERVIOUS LG (DRAPES) ×4 IMPLANT
SUCTION FRAZIER HANDLE 10FR (MISCELLANEOUS) ×2
SUCTION TUBE FRAZIER 10FR DISP (MISCELLANEOUS) ×2 IMPLANT
SUT ETHILON 3 0 PS 1 (SUTURE) ×16 IMPLANT
SUT MNCRL AB 3-0 PS2 18 (SUTURE) ×4 IMPLANT
SUT MON AB 2-0 CT1 36 (SUTURE) ×4 IMPLANT
SUT VIC AB 0 CT1 27 (SUTURE)
SUT VIC AB 0 CT1 27XBRD ANBCTR (SUTURE) IMPLANT
SUT VIC AB 2-0 CT1 27 (SUTURE)
SUT VIC AB 2-0 CT1 TAPERPNT 27 (SUTURE) IMPLANT
TOWEL OR 17X24 6PK STRL BLUE (TOWEL DISPOSABLE) ×4 IMPLANT
TOWEL OR 17X26 10 PK STRL BLUE (TOWEL DISPOSABLE) ×8 IMPLANT
TRAY FOLEY CATH 14FR (SET/KITS/TRAYS/PACK) ×4 IMPLANT
TUBE ASSEMBLY RIA STERILE (MISCELLANEOUS) ×4 IMPLANT
TUBE CONNECTING 20'X1/4 (TUBING) ×1
TUBE CONNECTING 20X1/4 (TUBING) ×3 IMPLANT
YANKAUER SUCT BULB TIP NO VENT (SUCTIONS) IMPLANT

## 2017-08-26 NOTE — Interval H&P Note (Signed)
History and Physical Interval Note:  08/26/2017 7:04 AM  Briana DeisErika [name]  has presented today for surgery, with the diagnosis of Right tibial nonunion  The various methods of treatment have been discussed with the patient and family. After consideration of risks, benefits and other options for treatment, the patient has consented to  Procedure(s): Exchange nailing right tibia (Right) Bone grafting right tibia with left femur RIA harvest (Left) as a surgical intervention .  The patient's history has been reviewed, patient examined, no change in status, stable for surgery.  I have reviewed the patient's chart and labs.  Questions were answered to the patient's satisfaction.     Caryn BeeKevin P Jolly Bleicher

## 2017-08-26 NOTE — Anesthesia Procedure Notes (Addendum)
Procedure Name: Intubation Date/Time: 08/26/2017 8:13 AM Performed by: Fransisca KaufmannMeyer, Istvan Behar E, CRNA Pre-anesthesia Checklist: Patient identified, Emergency Drugs available, Suction available and Patient being monitored Patient Re-evaluated:Patient Re-evaluated prior to induction Oxygen Delivery Method: Circle System Utilized Preoxygenation: Pre-oxygenation with 100% oxygen Induction Type: IV induction Ventilation: Mask ventilation without difficulty Laryngoscope Size: Glidescope Grade View: Grade I Tube type: Oral Tube size: 7.0 mm Number of attempts: 1 Airway Equipment and Method: Stylet,  Oral airway and Video-laryngoscopy Placement Confirmation: ETT inserted through vocal cords under direct vision,  positive ETCO2 and breath sounds checked- equal and bilateral Tube secured with: Tape Dental Injury: Teeth and Oropharynx as per pre-operative assessment
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
## 2017-08-26 NOTE — Anesthesia Preprocedure Evaluation (Addendum)
Anesthesia Evaluation  Patient identified by MRN, date of birth, ID band Patient awake    Reviewed: Allergy & Precautions, NPO status , Patient's Chart, lab work & pertinent test results, reviewed documented beta blocker date and time   Airway Mallampati: II  TM Distance: >3 FB Neck ROM: Full    Dental  (+) Teeth Intact, Dental Advisory Given   Pulmonary neg pulmonary ROS,    Pulmonary exam normal breath sounds clear to auscultation       Cardiovascular Normal cardiovascular exam Rhythm:Regular Rate:Normal     Neuro/Psych TBI negative neurological ROS     GI/Hepatic negative GI ROS, Neg liver ROS,   Endo/Other  negative endocrine ROS  Renal/GU negative Renal ROS     Musculoskeletal  Displaced segmental fracture of shaft of right tibia   Abdominal   Peds  Hematology negative hematology ROS (+)   Anesthesia Other Findings Day of surgery medications reviewed with the patient.  Reproductive/Obstetrics                             Anesthesia Physical Anesthesia Plan  ASA: III  Anesthesia Plan: General   Post-op Pain Management:    Induction: Intravenous  PONV Risk Score and Plan: 3 and Midazolam, Dexamethasone and Ondansetron  Airway Management Planned: Oral ETT and Video Laryngoscope Planned  Additional Equipment:   Intra-op Plan:   Post-operative Plan: Extubation in OR  Informed Consent: I have reviewed the patients History and Physical, chart, labs and discussed the procedure including the risks, benefits and alternatives for the proposed anesthesia with the patient or authorized representative who has indicated his/her understanding and acceptance.   Dental advisory given  Plan Discussed with: CRNA  Anesthesia Plan Comments:        Anesthesia Quick Evaluation

## 2017-08-26 NOTE — Transfer of Care (Signed)
Immediate Anesthesia Transfer of Care Note  Patient: Briana [name]  Procedure(s) Performed: Exchange nailing right tibia (Right ) Bone grafting right tibia with left femur RIA harvest (Left )  Patient Location: PACU  Anesthesia Type:General  Level of Consciousness: awake, alert , oriented and sedated  Airway & Oxygen Therapy: Patient Spontanous Breathing and Patient connected to nasal cannula oxygen  Post-op Assessment: Report given to RN, Post -op Vital signs reviewed and stable and Patient moving all extremities  Post vital signs: Reviewed and stable  Last Vitals:  Vitals Value Taken Time  BP 97/65 08/26/2017 11:42 AM  Temp    Pulse 85 08/26/2017 11:46 AM  Resp 14 08/26/2017 11:46 AM  SpO2 100 % 08/26/2017 11:46 AM  Vitals shown include unvalidated device data.  Last Pain:  Vitals:   08/26/17 0658  TempSrc:   PainSc: 0-No pain      Patients Stated Pain Goal: 5 (08/26/17 [id number])  Complications: No apparent anesthesia complications

## 2017-08-27 ENCOUNTER — Encounter (HOSPITAL_COMMUNITY): Payer: Self-pay | Admitting: Student

## 2017-08-27 ENCOUNTER — Other Ambulatory Visit: Payer: Self-pay | Admitting: Physical Medicine & Rehabilitation

## 2017-08-27 LAB — CBC
HEMATOCRIT: 26.1 % — AB (ref 36.0–46.0)
HEMOGLOBIN: 8.8 g/dL — AB (ref 12.0–15.0)
MCH: 29.6 pg (ref 26.0–34.0)
MCHC: 33.7 g/dL (ref 30.0–36.0)
MCV: 87.9 fL (ref 78.0–100.0)
Platelets: 223 10*3/uL (ref 150–400)
RBC: 2.97 MIL/uL — ABNORMAL LOW (ref 3.87–5.11)
RDW: 13.2 % (ref 11.5–15.5)
WBC: 8.4 10*3/uL (ref 4.0–10.5)

## 2017-08-27 NOTE — Progress Notes (Addendum)
Orthopaedic Trauma Progress Note  S: Patients leg is sore. Left leg is not hurting too bad  O:  Vitals:   08/27/17 0616 08/27/17 1034  BP: 110/66 96/66  Pulse: (!) 119 (!) 116  Resp: 16 17  Temp: 98.3 F (36.8 C) 98.4 F (36.9 C)  SpO2: 98% 100%   Gen: NAD, AAOx3 Dressings clean dry and intact. Compartments soft and compressible  CBC    Component Value Date/Time   WBC 8.4 08/27/2017 0359   RBC 2.97 (L) 08/27/2017 0359   HGB 8.8 (L) 08/27/2017 0359   HCT 26.1 (L) 08/27/2017 0359   PLT 223 08/27/2017 0359   MCV 87.9 08/27/2017 0359   MCH 29.6 08/27/2017 0359   MCHC 33.7 08/27/2017 0359   RDW 13.2 08/27/2017 0359   LYMPHSABS 2.0 06/25/2017 0624   MONOABS 0.6 06/25/2017 0624   EOSABS 0.0 06/25/2017 0624   BASOSABS 0.0 06/25/2017 53062124   A/P: 22 year old female status post exchange nailing and bone grafting of right tibial nonunion and acute blood loss anemia (will monitor)  Weightbearing: Nonweightbearing right lower extremity in boot, weightbearing as tolerated left lower extremity Insicional and dressing care: Incisional wound vac until tomorrow Orthopedic device(s): CAM Boot to RLE Showering: TBD VTE prophylaxis: Lovenox 40mg  qd while in hospital Pain control: Oxycodone Follow - up plan: 2 weeks Discharge tomorrow  Roby LoftsKevin P. Alfie Rideaux, MD [facility] [phone] (phone)

## 2017-08-27 NOTE — Anesthesia Postprocedure Evaluation (Signed)
Anesthesia Post Note  Patient: Briana [name]  Procedure(s) Performed: Exchange nailing right tibia (Right ) Bone grafting right tibia with left femur RIA harvest (Left )     Patient location during evaluation: PACU Anesthesia Type: General Level of consciousness: awake and alert Pain management: pain level controlled Vital Signs Assessment: post-procedure vital signs reviewed and stable Respiratory status: spontaneous breathing, nonlabored ventilation and respiratory function stable Cardiovascular status: blood pressure returned to baseline and stable Postop Assessment: no apparent nausea or vomiting Anesthetic complications: no    Last Vitals:  Vitals:   08/27/17 0121 08/27/17 0616  BP: 99/61 110/66  Pulse: (!) 109 (!) 119  Resp: 16 16  Temp: 37.1 C 36.8 C  SpO2: 100% 98%    Last Pain:  Vitals:   08/27/17 0121  TempSrc: Oral  PainSc:                  Cecile HearingStephen Edward Turk
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
## 2017-08-28 MED ORDER — HYDROCODONE-ACETAMINOPHEN 5-325 MG PO TABS: 1.0000 | ORAL_TABLET | ORAL | 0 refills | Status: DC | PRN

## 2017-08-28 MED ORDER — ASPIRIN EC 325 MG PO TBEC: 325.0000 mg | DELAYED_RELEASE_TABLET | Freq: Every day | ORAL | 0 refills | Status: AC

## 2017-08-28 MED ORDER — ENOXAPARIN SODIUM 30 MG/0.3ML ~~LOC~~ SOLN: 30.0000 mg | SUBCUTANEOUS | Status: DC

## 2017-08-28 MED ORDER — METHOCARBAMOL 750 MG PO TABS: 750.0000 mg | ORAL_TABLET | Freq: Four times a day (QID) | ORAL | 0 refills | Status: DC | PRN
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
## 2017-08-28 NOTE — Discharge Instructions (Signed)
[facility] Discharge Instructions   General Discharge Instructions  WEIGHT BEARING STATUS: Nonweight bearing right leg in boot  RANGE OF MOTION/ACTIVITY: Move ankle and knee as tolerated  Wound Care: Remove dressing on Sunday and if dry can leave open to air and shower. Make sure keep suture line covered while in boot.  DVT/PE prophylaxis: Aspirin 325mg  daily  Diet: as you were eating previously.  Can use over the counter stool softeners and bowel preparations, such as Miralax, to help with bowel movements.  Narcotics can be constipating.  Be sure to drink plenty of fluids  PAIN MEDICATION USE AND EXPECTATIONS  You have likely been given narcotic medications to help control your pain.  After a traumatic event that results in an fracture (broken bone) with or without surgery, it is ok to use narcotic pain medications to help control one's pain.  We understand that everyone responds to pain differently and each individual patient will be evaluated on a regular basis for the continued need for narcotic medications. Ideally, narcotic medication use should last no more than 6-8 weeks (coinciding with fracture healing).   As a patient it is your responsibility as well to monitor narcotic medication use and report the amount and frequency you use these medications when you come to your office visit.   We would also advise that if you are using narcotic medications, you should take a dose prior to therapy to maximize you participation.  IF YOU ARE ON NARCOTIC MEDICATIONS IT IS NOT PERMISSIBLE TO OPERATE A MOTOR VEHICLE (MOTORCYCLE/CAR/TRUCK/MOPED) OR HEAVY MACHINERY DO NOT MIX NARCOTICS WITH OTHER CNS (CENTRAL NERVOUS SYSTEM) DEPRESSANTS SUCH AS ALCOHOL   STOP SMOKING OR USING NICOTINE PRODUCTS!!!!  As discussed nicotine severely impairs your body's ability to heal surgical and traumatic wounds but also impairs bone healing.  Wounds and bone heal by forming microscopic blood vessels  (angiogenesis) and nicotine is a vasoconstrictor (essentially, shrinks blood vessels).  Therefore, if vasoconstriction occurs to these microscopic blood vessels they essentially disappear and are unable to deliver necessary nutrients to the healing tissue.  This is one modifiable factor that you can do to dramatically increase your chances of healing your injury.    (This means no smoking, no nicotine gum, patches, etc)  DO NOT USE NONSTEROIDAL ANTI-INFLAMMATORY DRUGS (NSAID'S)  Using products such as Advil (ibuprofen), Aleve (naproxen), Motrin (ibuprofen) for additional pain control during fracture healing can delay and/or prevent the healing response.  If you would like to take over the counter (OTC) medication, Tylenol (acetaminophen) is ok.  However, some narcotic medications that are given for pain control contain acetaminophen as well. Therefore, you should not exceed more than 4000 mg of tylenol in a day if you do not have liver disease.  Also note that there are may OTC medicines, such as cold medicines and allergy medicines that my contain tylenol as well.  If you have any questions about medications and/or interactions please ask your doctor/PA or your pharmacist.      ICE AND ELEVATE INJURED/OPERATIVE EXTREMITY  Using ice and elevating the injured extremity above your heart can help with swelling and pain control.  Icing in a pulsatile fashion, such as 20 minutes on and 20 minutes off, can be followed.    Do not place ice directly on skin. Make sure there is a barrier between to skin and the ice pack.    Using frozen items such as frozen peas works well as the conform nicely to the are that  needs to be iced.  USE AN ACE WRAP OR TED HOSE FOR SWELLING CONTROL  In addition to icing and elevation, Ace wraps or TED hose are used to help limit and resolve swelling.  It is recommended to use Ace wraps or TED hose until you are informed to stop.    When using Ace Wraps start the wrapping distally  (farthest away from the body) and wrap proximally (closer to the body)   Example: If you had surgery on your leg or thing and you do not have a splint on, start the ace wrap at the toes and work your way up to the thigh        If you had surgery on your upper extremity and do not have a splint on, start the ace wrap at your fingers and work your way up to the upper arm  IF YOU ARE IN A CAM BOOT (BLACK BOOT)  You may remove boot periodically. Perform daily dressing changes as noted below.  Wash the liner of the boot regularly and wear a sock when wearing the boot. It is recommended that you sleep in the boot until told otherwise  CALL THE OFFICE WITH ANY QUESTIONS OR CONCERNS: [phone]

## 2017-08-28 NOTE — Progress Notes (Signed)
Briana [name] to be D/C'd  per MD order.   Patient and patient's father instructed to return to ED, call 911, or call MD for any changes in condition.   Patient to be escorted via WC, and D/C home via private auto.
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
## 2018-05-17 LAB — HM HIV SCREENING LAB: HM HIV Screening: NEGATIVE
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
## 2019-02-02 ENCOUNTER — Ambulatory Visit (LOCAL_COMMUNITY_HEALTH_CENTER): Payer: Self-pay

## 2019-02-02 ENCOUNTER — Other Ambulatory Visit: Payer: Self-pay

## 2019-02-02 VITALS — BP 99/59 | Ht 63.0 in | Wt 109.0 lb

## 2019-02-02 DIAGNOSIS — Z3009 Encounter for other general counseling and advice on contraception: Secondary | ICD-10-CM

## 2019-02-02 DIAGNOSIS — Z30013 Encounter for initial prescription of injectable contraceptive: Secondary | ICD-10-CM

## 2019-02-03 MED ORDER — MEDROXYPROGESTERONE ACETATE 150 MG/ML IM SUSP
150.0000 mg | Freq: Once | INTRAMUSCULAR | Status: AC
  Administered 2019-02-02: 10:00:00 150 mg via INTRAMUSCULAR

## 2019-02-03 NOTE — Progress Notes (Signed)
Depo given per [facility] [place] 05/2018 order. Tolerated well Aileen Fass, RN

## 2019-04-20 ENCOUNTER — Other Ambulatory Visit: Payer: Self-pay

## 2019-04-20 ENCOUNTER — Ambulatory Visit (LOCAL_COMMUNITY_HEALTH_CENTER): Payer: Self-pay

## 2019-04-20 VITALS — BP 98/68 | Ht 63.0 in | Wt 108.5 lb

## 2019-04-20 DIAGNOSIS — Z3009 Encounter for other general counseling and advice on contraception: Secondary | ICD-10-CM

## 2019-04-20 MED ORDER — MEDROXYPROGESTERONE ACETATE 150 MG/ML IM SUSP
150.0000 mg | Freq: Once | INTRAMUSCULAR | Status: AC
  Administered 2019-04-20: 10:00:00 150 mg via INTRAMUSCULAR

## 2019-04-20 MED ORDER — MULTI-VITAMIN/MINERALS PO TABS: 1.0000 | ORAL_TABLET | Freq: Every day | ORAL | 0 refills | Status: DC

## 2019-04-20 NOTE — Progress Notes (Signed)
Folic acid counseling completed and MVI dispensed. Depo administered without difficulty per 05/18/2019 written order of Antoine Primas PA and client tolerated without complaint. Client counseled needs PAP and encouraged to schedule appt for physical as last physical 05/18/2019 (no payor source). Client aware if agency not yet scheduling physicals to schedule appt with provider as PAP needed as well as order to continue Depo. Rich Number, RN

## 2019-05-04 ENCOUNTER — Other Ambulatory Visit: Payer: Self-pay

## 2019-05-04 ENCOUNTER — Emergency Department: Payer: Self-pay

## 2019-05-04 ENCOUNTER — Emergency Department
Admission: EM | Admit: 2019-05-04 | Discharge: 2019-05-04 | Disposition: A | Discharge status: Home / Self Care | Payer: Self-pay | Attending: Emergency Medicine | Admitting: Emergency Medicine

## 2019-05-04 DIAGNOSIS — Y939 Activity, unspecified: Secondary | ICD-10-CM | POA: Insufficient documentation

## 2019-05-04 DIAGNOSIS — Y92513 Shop (commercial) as the place of occurrence of the external cause: Secondary | ICD-10-CM | POA: Insufficient documentation

## 2019-05-04 DIAGNOSIS — S66911A Strain of unspecified muscle, fascia and tendon at wrist and hand level, right hand, initial encounter: Secondary | ICD-10-CM | POA: Insufficient documentation

## 2019-05-04 DIAGNOSIS — S63501A Unspecified sprain of right wrist, initial encounter: Secondary | ICD-10-CM | POA: Insufficient documentation

## 2019-05-04 DIAGNOSIS — Y990 Civilian activity done for income or pay: Secondary | ICD-10-CM | POA: Insufficient documentation

## 2019-05-04 DIAGNOSIS — I10 Essential (primary) hypertension: Secondary | ICD-10-CM | POA: Insufficient documentation

## 2019-05-04 DIAGNOSIS — W010XXA Fall on same level from slipping, tripping and stumbling without subsequent striking against object, initial encounter: Secondary | ICD-10-CM | POA: Insufficient documentation

## 2019-05-04 MED ORDER — TRAMADOL HCL 50 MG PO TABS
50.0000 mg | ORAL_TABLET | Freq: Once | ORAL | Status: AC
  Administered 2019-05-04: 50 mg via ORAL
  Filled 2019-05-04: qty 1

## 2019-05-04 MED ORDER — IBUPROFEN 600 MG PO TABS: 600.0000 mg | ORAL_TABLET | Freq: Three times a day (TID) | ORAL | 0 refills | Status: DC | PRN

## 2019-05-04 MED ORDER — IBUPROFEN 600 MG PO TABS
600.0000 mg | ORAL_TABLET | Freq: Once | ORAL | Status: AC
  Administered 2019-05-04: 14:00:00 600 mg via ORAL
  Filled 2019-05-04: qty 1

## 2019-05-04 NOTE — Discharge Instructions (Signed)
Follow discharge care instruction wear wrist splint for 3 to 5 days as needed.

## 2019-05-04 NOTE — ED Triage Notes (Signed)
Pt states she was at work and hurt her right hand.  Pt states they had just mopped the floors. Pt states she slipped and fell at work and put her hand down first to break her fall. Pt states 8/10 pain.   Pt has swelling and some bruising noted. Pt able to move all fingers. Pt states more pain at the wrist area.   Pt works at [employer] and wishes to file for [employer].
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
## 2020-07-06 ENCOUNTER — Emergency Department
Admission: EM | Admit: 2020-07-06 | Discharge: 2020-07-06 | Disposition: A | Discharge status: Home / Self Care | Payer: Self-pay | Attending: Emergency Medicine | Admitting: Emergency Medicine

## 2020-07-06 ENCOUNTER — Other Ambulatory Visit: Payer: Self-pay

## 2020-07-06 ENCOUNTER — Emergency Department: Payer: Self-pay

## 2020-07-06 DIAGNOSIS — R1030 Lower abdominal pain, unspecified: Secondary | ICD-10-CM

## 2020-07-06 DIAGNOSIS — N3000 Acute cystitis without hematuria: Secondary | ICD-10-CM

## 2020-07-06 DIAGNOSIS — O2311 Infections of bladder in pregnancy, first trimester: Secondary | ICD-10-CM | POA: Insufficient documentation

## 2020-07-06 DIAGNOSIS — N8312 Corpus luteum cyst of left ovary: Secondary | ICD-10-CM | POA: Insufficient documentation

## 2020-07-06 DIAGNOSIS — O3481 Maternal care for other abnormalities of pelvic organs, first trimester: Secondary | ICD-10-CM | POA: Insufficient documentation

## 2020-07-06 DIAGNOSIS — N831 Corpus luteum cyst of ovary, unspecified side: Secondary | ICD-10-CM

## 2020-07-06 DIAGNOSIS — O10011 Pre-existing essential hypertension complicating pregnancy, first trimester: Secondary | ICD-10-CM | POA: Insufficient documentation

## 2020-07-06 DIAGNOSIS — Z3A09 9 weeks gestation of pregnancy: Secondary | ICD-10-CM | POA: Insufficient documentation

## 2020-07-06 DIAGNOSIS — R102 Pelvic and perineal pain: Secondary | ICD-10-CM

## 2020-07-06 DIAGNOSIS — Z79899 Other long term (current) drug therapy: Secondary | ICD-10-CM | POA: Insufficient documentation

## 2020-07-06 LAB — COMPREHENSIVE METABOLIC PANEL
ALT: 13 U/L (ref 0–44)
AST: 18 U/L (ref 15–41)
Albumin: 4.3 g/dL (ref 3.5–5.0)
Alkaline Phosphatase: 52 U/L (ref 38–126)
Anion gap: 8 (ref 5–15)
BUN: 6 mg/dL (ref 6–20)
CO2: 22 mmol/L (ref 22–32)
Calcium: 9.3 mg/dL (ref 8.9–10.3)
Chloride: 107 mmol/L (ref 98–111)
Creatinine, Ser: 0.51 mg/dL (ref 0.44–1.00)
GFR, Estimated: >60 mL/min (ref >60)
Glucose, Bld: 94 mg/dL (ref 70–99)
Potassium: 4.5 mmol/L (ref 3.5–5.1)
Sodium: 137 mmol/L (ref 135–145)
Total Bilirubin: 0.5 mg/dL (ref 0.3–1.2)
Total Protein: 7.3 g/dL (ref 6.5–8.1)

## 2020-07-06 LAB — URINALYSIS, COMPLETE (UACMP) WITH MICROSCOPIC
Bilirubin Urine: NEGATIVE
Bilirubin Urine: NEGATIVE
Glucose, UA: NEGATIVE mg/dL
Glucose, UA: NEGATIVE mg/dL
Hgb urine dipstick: NEGATIVE
Hgb urine dipstick: NEGATIVE
Ketones, ur: NEGATIVE mg/dL
Ketones, ur: NEGATIVE mg/dL
Nitrite: NEGATIVE
Nitrite: NEGATIVE
Protein, ur: NEGATIVE mg/dL
Protein, ur: NEGATIVE mg/dL
Specific Gravity, Urine: 1.016 (ref 1.005–1.030)
Specific Gravity, Urine: 1.016 (ref 1.005–1.030)
pH: 7 (ref 5.0–8.0)
pH: 7 (ref 5.0–8.0)

## 2020-07-06 LAB — CBC WITH DIFFERENTIAL/PLATELET
Abs Immature Granulocytes: 0.03 10*3/uL (ref 0.00–0.07)
Basophils Absolute: 0.1 10*3/uL (ref 0.0–0.1)
Basophils Relative: 1 %
Eosinophils Absolute: 0.1 10*3/uL (ref 0.0–0.5)
Eosinophils Relative: 1 %
HCT: 36.2 % (ref 36.0–46.0)
Hemoglobin: 12.2 g/dL (ref 12.0–15.0)
Immature Granulocytes: 0 %
Lymphocytes Relative: 22 %
Lymphs Abs: 2.2 10*3/uL (ref 0.7–4.0)
MCH: 29.5 pg (ref 26.0–34.0)
MCHC: 33.7 g/dL (ref 30.0–36.0)
MCV: 87.7 fL (ref 80.0–100.0)
Monocytes Absolute: 0.5 10*3/uL (ref 0.1–1.0)
Monocytes Relative: 5 %
Neutro Abs: 7 10*3/uL (ref 1.7–7.7)
Neutrophils Relative %: 71 %
Platelets: 249 10*3/uL (ref 150–400)
RBC: 4.13 MIL/uL (ref 3.87–5.11)
RDW: 12.7 % (ref 11.5–15.5)
WBC: 9.9 10*3/uL (ref 4.0–10.5)
nRBC: 0 % (ref 0.0–0.2)

## 2020-07-06 LAB — URINE DRUG SCREEN, QUALITATIVE (ARMC ONLY)
Amphetamines, Ur Screen: NOT DETECTED
Barbiturates, Ur Screen: NOT DETECTED
Benzodiazepine, Ur Scrn: NOT DETECTED
Cannabinoid 50 Ng, Ur ~~LOC~~: NOT DETECTED
Cocaine Metabolite,Ur ~~LOC~~: NOT DETECTED
MDMA (Ecstasy)Ur Screen: NOT DETECTED
Methadone Scn, Ur: NOT DETECTED
Opiate, Ur Screen: NOT DETECTED
Phencyclidine (PCP) Ur S: NOT DETECTED
Tricyclic, Ur Screen: NOT DETECTED

## 2020-07-06 LAB — LIPASE, BLOOD: Lipase: 32 U/L (ref 11–51)

## 2020-07-06 LAB — CHLAMYDIA/NGC RT PCR (ARMC ONLY)
Chlamydia Tr: NOT DETECTED
N gonorrhoeae: NOT DETECTED

## 2020-07-06 LAB — HCG, QUANTITATIVE, PREGNANCY: hCG, Beta Chain, Quant, S: 137774 m[IU]/mL — ABNORMAL HIGH (ref <5)

## 2020-07-06 LAB — LACTIC ACID, PLASMA: Lactic Acid, Venous: 1 mmol/L (ref 0.5–1.9)

## 2020-07-06 MED ORDER — SODIUM CHLORIDE 0.9 % IV SOLN
2.0000 g | Freq: Once | INTRAVENOUS | Status: AC
  Administered 2020-07-06: 2 g via INTRAVENOUS
  Filled 2020-07-06: qty 20

## 2020-07-06 MED ORDER — CEPHALEXIN 500 MG PO CAPS: 500.0000 mg | ORAL_CAPSULE | Freq: Three times a day (TID) | ORAL | 0 refills | Status: AC

## 2020-07-06 MED ORDER — ONDANSETRON HCL 4 MG/2ML IJ SOLN
4.0000 mg | Freq: Once | INTRAMUSCULAR | Status: AC
  Administered 2020-07-06: 4 mg via INTRAVENOUS
  Filled 2020-07-06: qty 2

## 2020-07-06 MED ORDER — ACETAMINOPHEN 500 MG PO TABS
1000.0000 mg | ORAL_TABLET | Freq: Once | ORAL | Status: AC
  Administered 2020-07-06: 1000 mg via ORAL
  Filled 2020-07-06: qty 2

## 2020-07-06 MED ORDER — SODIUM CHLORIDE 0.9 % IV BOLUS
1000.0000 mL | Freq: Once | INTRAVENOUS | Status: AC
  Administered 2020-07-06: 1000 mL via INTRAVENOUS

## 2020-07-06 MED ORDER — LACTATED RINGERS IV BOLUS
1000.0000 mL | Freq: Once | INTRAVENOUS | Status: AC
  Administered 2020-07-06: 1000 mL via INTRAVENOUS

## 2020-07-06 NOTE — ED Notes (Signed)
interpreter used.  Pt states [redacted] weeks pregnant, denies any vaginal bleeding or discharge.  Pt also reports left side flank pain.  States pain 9/10, tearful in room. Denies pain or frequency with urination.

## 2020-07-06 NOTE — ED Triage Notes (Signed)
Pt to ED via POV stating that she is having severe pain in her lower abdomen that started 20 minutes PTA. Pt states that she is about [redacted] weeks pregnant. Pt has had ultrasound. Pt denies vaginal bleeding. Pt denies any other symptoms. Pt is G2P1.

## 2020-07-06 NOTE — Discharge Instructions (Addendum)
Take tylenol [phone] mg every 6-8 hours for pain  Drink plenty of fluid
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
## 2020-07-06 NOTE — ED Notes (Signed)
Pt reports still unable to urinate, NS started per v/o Dr. Vicente Males.

## 2020-07-08 LAB — URINE CULTURE

## 2021-02-10 DIAGNOSIS — Z8659 Personal history of other mental and behavioral disorders: Secondary | ICD-10-CM | POA: Insufficient documentation

## 2021-10-17 ENCOUNTER — Encounter: Payer: Self-pay | Admitting: Family Medicine

## 2021-10-17 ENCOUNTER — Ambulatory Visit (LOCAL_COMMUNITY_HEALTH_CENTER): Payer: Self-pay | Admitting: Family Medicine

## 2021-10-17 VITALS — BP 96/62 | Ht 64.0 in | Wt 129.2 lb

## 2021-10-17 DIAGNOSIS — Z3009 Encounter for other general counseling and advice on contraception: Secondary | ICD-10-CM

## 2021-10-17 DIAGNOSIS — Z0000 Encounter for general adult medical examination without abnormal findings: Secondary | ICD-10-CM

## 2021-10-17 DIAGNOSIS — Z30013 Encounter for initial prescription of injectable contraceptive: Secondary | ICD-10-CM

## 2021-10-17 MED ORDER — MEDROXYPROGESTERONE ACETATE 150 MG/ML IM SUSP
150.0000 mg | INTRAMUSCULAR | Status: AC
  Administered 2021-10-17 – 2022-06-24 (×4): 150 mg via INTRAMUSCULAR

## 2021-10-17 NOTE — Progress Notes (Addendum)
Here today for a PE and Depo. Last PE here was 05/17/2018. No previous records of Pap Smears. States last Depo was 07/2021 and is due 10/10/21 (?11.0 weeks ago.) Declines STD screening today. PHQ9 (=8) completed in Epic. Tawny Hopping, RN

## 2021-10-17 NOTE — Progress Notes (Signed)
Depo given per verbal order of Elveria Rising, FNP. Patient tolerated well. Reminder card given to return around 01/03/22 for next Depo. ROI faxed to [facility] Dept for most recent PE, Pap Smear and Depo records. Tawny Hopping, RN
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
## 2022-01-02 ENCOUNTER — Ambulatory Visit (LOCAL_COMMUNITY_HEALTH_CENTER): Payer: Self-pay

## 2022-01-02 VITALS — BP 109/61 | Ht 64.0 in | Wt 126.0 lb

## 2022-01-02 DIAGNOSIS — Z3042 Encounter for surveillance of injectable contraceptive: Secondary | ICD-10-CM

## 2022-01-02 DIAGNOSIS — Z3009 Encounter for other general counseling and advice on contraception: Secondary | ICD-10-CM

## 2022-01-02 NOTE — Progress Notes (Signed)
11 weeks post depo.  Language line interpreter Burman Foster [phone].  Pt satisfied with depo and desires to continue.  Depo given IM left deltoid per order by Elveria Rising dated 10/17/21.  Tolerated well.  Next depo due 03/20/22; has appt reminder.  Cherlynn Polo, RN

## 2022-04-01 ENCOUNTER — Ambulatory Visit (LOCAL_COMMUNITY_HEALTH_CENTER): Payer: Self-pay

## 2022-04-01 VITALS — BP 102/58

## 2022-04-01 DIAGNOSIS — Z3042 Encounter for surveillance of injectable contraceptive: Secondary | ICD-10-CM

## 2022-04-01 DIAGNOSIS — Z3009 Encounter for other general counseling and advice on contraception: Secondary | ICD-10-CM

## 2022-04-01 NOTE — Progress Notes (Addendum)
12 weeks 5 days post depo.  Denies problems. States she may want to change to the patch at some time.  Written information given regarding the patch and other methods.  Instructed to call for appt if desires to change.   Depo given IM right deltoid per order by Hilario Quarry FNP dated 10/17/21; tolerated well.  Next depo due 06/17/22 if desires to continue depo; appt reminder given.   Bp 102/58; advised increase fluids and MVI.    Tonny Branch, RN

## 2022-06-24 ENCOUNTER — Ambulatory Visit: Payer: Self-pay

## 2022-06-24 ENCOUNTER — Ambulatory Visit (LOCAL_COMMUNITY_HEALTH_CENTER): Payer: Self-pay | Admitting: Family

## 2022-06-24 VITALS — BP 107/69 | HR 97 | Ht 64.0 in | Wt 128.4 lb

## 2022-06-24 DIAGNOSIS — Z30013 Encounter for initial prescription of injectable contraceptive: Secondary | ICD-10-CM

## 2022-06-24 DIAGNOSIS — Z3009 Encounter for other general counseling and advice on contraception: Secondary | ICD-10-CM

## 2022-06-24 NOTE — Progress Notes (Unsigned)
Pt is here for Depo injection.  It has been 12 weeks since her last injection.  Depo 150 mg given IM in Lt Deltoid,  per order 10/17/2021 order.  Pt tolerated well.  Pt given reminder card to return in 11-13 weeks for next Depo injection.  Windle Guard, RN

## 2022-06-26 ENCOUNTER — Encounter: Payer: Self-pay | Admitting: Family

## 2022-06-26 NOTE — Progress Notes (Signed)
NURSE ONLY VISIT, REVIEWED BY APP. Tiajuana Leppanen, FNP-C

## 2022-09-09 ENCOUNTER — Ambulatory Visit: Payer: Self-pay

## 2022-09-09 ENCOUNTER — Ambulatory Visit (LOCAL_COMMUNITY_HEALTH_CENTER): Payer: Self-pay

## 2022-09-09 VITALS — BP 101/65 | Ht 64.0 in | Wt 133.0 lb

## 2022-09-09 DIAGNOSIS — Z3042 Encounter for surveillance of injectable contraceptive: Secondary | ICD-10-CM

## 2022-09-09 DIAGNOSIS — Z3009 Encounter for other general counseling and advice on contraception: Secondary | ICD-10-CM

## 2022-09-09 MED ORDER — MEDROXYPROGESTERONE ACETATE 150 MG/ML IM SUSP
150.0000 mg | Freq: Once | INTRAMUSCULAR | Status: AC
  Administered 2022-09-09: 150 mg via INTRAMUSCULAR

## 2022-09-09 NOTE — Progress Notes (Signed)
11 weeks post hormonal injection.  Patient reported no concerns or complaints. Medroxyprogesterone Acetate 150 mg given per order dated 10/17/2021 for 1 year by Jonne Ply, FNP.  Given IM right deltoid. Tolerated well. Reminder card provided to call for appointment for next hormonal injection due 11/25/2022. Also discussed annual PE due 10/19/2022 and she can make appointment for PE when next hormonal injection so only 1 visit.

## 2022-12-09 ENCOUNTER — Ambulatory Visit (LOCAL_COMMUNITY_HEALTH_CENTER): Payer: Self-pay | Admitting: Family Medicine

## 2022-12-09 ENCOUNTER — Encounter: Payer: Self-pay | Admitting: Family Medicine

## 2022-12-09 VITALS — BP 98/61 | HR 81 | Ht 64.0 in | Wt 137.6 lb

## 2022-12-09 DIAGNOSIS — Z01419 Encounter for gynecological examination (general) (routine) without abnormal findings: Secondary | ICD-10-CM

## 2022-12-09 DIAGNOSIS — Z3009 Encounter for other general counseling and advice on contraception: Secondary | ICD-10-CM

## 2022-12-09 DIAGNOSIS — Z113 Encounter for screening for infections with a predominantly sexual mode of transmission: Secondary | ICD-10-CM

## 2022-12-09 DIAGNOSIS — Z30013 Encounter for initial prescription of injectable contraceptive: Secondary | ICD-10-CM

## 2022-12-09 LAB — WET PREP FOR TRICH, YEAST, CLUE
Trichomonas Exam: NEGATIVE
Yeast Exam: NEGATIVE

## 2022-12-09 MED ORDER — MEDROXYPROGESTERONE ACETATE 150 MG/ML IM SUSP
150.0000 mg | INTRAMUSCULAR | Status: AC
Start: 2022-12-09 — End: 2023-10-15
  Administered 2022-12-09 – 2023-10-15 (×5): 150 mg via INTRAMUSCULAR

## 2022-12-09 NOTE — Progress Notes (Signed)
Pt here for annual physical, pap smear and Depo Provera injection.  Wet mount results reviewed with patient.  No treatment needed at this time as per standing orders.  Depo Provera 150mg  IM given in L deltoid without complications.  Family planning packet provided.  Condoms declined.-Collins Scotland, RN

## 2022-12-09 NOTE — Progress Notes (Signed)
[facility] DEPARTMENT [facility] [address] Main Number: [phone]  Family Planning Visit- Repeat Yearly Visit  Subjective:  Briana [name] is a 27 y.o. G2P2002  being seen today for an annual wellness visit and to discuss contraception options.   The patient is currently using Hormonal Injection for pregnancy prevention. Patient does not want a pregnancy in the next year.    report they are looking for a method that provides High efficacy at preventing pregnancy   Patient has the following medical problems: has ETOH abuse and History of depression on their problem list.  Chief Complaint  Patient presents with  . Annual Exam    Patient reports to clinic for PE and pap smear  Patient denies concerns about self   See flowsheet for other program required questions.   Body mass index is 23.62 kg/m. - Patient is eligible for diabetes screening based on BMI> 25 and age >35?  no HA1C ordered? not applicable  Patient reports 1 of partners in last year. Desires STI screening?  Yes   Has patient been screened once for HCV in the past?  No   Lab Results  Component Value Date   HCVAB <0.1 06/01/2017    Does the patient have current of drug use, have a partner with drug use, and/or has been incarcerated since last result? No  If yes-- Screen for HCV through [facility]   Does the patient meet criteria for HBV testing? No  Criteria:  -Household, sexual or needle sharing contact with HBV -History of drug use -HIV positive -Those with known Hep C   Health Maintenance Due  Topic Date Due  . COVID-19 Vaccine (1) Never done  . PAP-Cervical Cytology Screening  Never done  . PAP SMEAR-Modifier  Never done    Review of Systems  Constitutional:  Negative for weight loss.  Eyes:  Negative for blurred vision.  Respiratory:  Negative for cough and shortness of breath.   Cardiovascular:  Negative for claudication.   Gastrointestinal:  Negative for nausea.  Genitourinary:  Negative for dysuria and frequency.  Skin:  Negative for rash.  Neurological:  Negative for headaches.  Endo/Heme/Allergies:  Does not bruise/bleed easily.    The following portions of the patient's history were reviewed and updated as appropriate: allergies, current medications, past family history, past medical history, past social history, past surgical history and problem list. Problem list updated.  Objective:   Vitals:   12/09/22 0852  BP: 98/61  Pulse: 81  Weight: 137 lb 9.6 oz (62.4 kg)  Height: 5\' 4"  (1.626 m)    Physical Exam Vitals and nursing note reviewed.  Constitutional:      Appearance: Normal appearance.  HENT:     Head: Normocephalic and atraumatic.     Mouth/Throat:     Mouth: Mucous membranes are moist.     Pharynx: Oropharynx is clear. No oropharyngeal exudate or posterior oropharyngeal erythema.  Pulmonary:     Effort: Pulmonary effort is normal.  Abdominal:     General: Abdomen is flat.     Palpations: There is no mass.     Tenderness: There is no abdominal tenderness. There is no rebound.  Genitourinary:    General: Normal vulva.     Exam position: Lithotomy position.     Pubic Area: No rash or pubic lice.      Labia:        Right: No rash or lesion.  Left: No rash or lesion.      Vagina: Vaginal discharge present. No erythema, bleeding or lesions.     Cervix: No cervical motion tenderness, discharge, friability, lesion or erythema.     Uterus: Normal.      Adnexa: Right adnexa normal and left adnexa normal.     Rectum: Normal.     Comments: pH = 4  Small amt of clear-mucous discharge present Lymphadenopathy:     Head:     Right side of head: No preauricular or posterior auricular adenopathy.     Left side of head: No preauricular or posterior auricular adenopathy.     Cervical: No cervical adenopathy.     Upper Body:     Right upper body: No supraclavicular, axillary or  epitrochlear adenopathy.     Left upper body: No supraclavicular, axillary or epitrochlear adenopathy.     Lower Body: No right inguinal adenopathy. No left inguinal adenopathy.  Skin:    General: Skin is warm and dry.     Findings: No rash.  Neurological:     Mental Status: She is alert and oriented to person, place, and time.      Assessment and Plan:  Briana [name] is a 28 y.o. female G2P2002 presenting to the [facility] for an yearly wellness and contraception visit  1. Well woman exam with routine gynecological exam -last CBE 09/2021- next due 09/2024, declines CBE today, no concerns -agrees to pap today- unclear when last done -no other concerns about self- denies Etoh use  - IGP, rfx Aptima HPV ASCU  2. Screening for venereal disease -declines other forms of STI testing-aside from wet prep  - WET PREP FOR TRICH, YEAST, CLUE  3. Family planning Contraception counseling: Reviewed options based on patient desire and reproductive life plan. Patient is interested in Hormonal Injection. This was provided to the patient today.  Risks, benefits, and typical effectiveness rates were reviewed.  Questions were answered.  Written information was also given to the patient to review.    The patient will follow up in  3 months for surveillance.  The patient was told to call with any further questions, or with any concerns about this method of contraception.  Emphasized use of condoms 100% of the time for STI prevention.  Educated on ECP and assessed need for ECP. Covered under depo window  - medroxyPROGESTERone (DEPO-PROVERA) injection 150 mg  Return in about 3 months (around 03/11/2023) for depo injection.  No future appointments. Due to language barrier, interpreter Marlene Yemen was present for this visit.  Lenice Llamas, [place]

## 2022-12-19 ENCOUNTER — Encounter: Payer: Self-pay | Admitting: Family Medicine

## 2022-12-19 ENCOUNTER — Telehealth: Payer: Self-pay

## 2022-12-19 DIAGNOSIS — R87619 Unspecified abnormal cytological findings in specimens from cervix uteri: Secondary | ICD-10-CM | POA: Insufficient documentation

## 2022-12-19 LAB — IGP, RFX APTIMA HPV ASCU: PAP Smear Comment: 0

## 2022-12-19 LAB — HPV APTIMA: HPV Aptima: POSITIVE — AB

## 2022-12-19 NOTE — Telephone Encounter (Signed)
Call to pt re pap result from 12/09/22 specimen and colpo referral. Epithelial Cell Abnormality, ASCUS Positive HPV  Confirm insurance status and discuss [facility].

## 2022-12-22 NOTE — Telephone Encounter (Signed)
Phone call to pt with [employer] at [phone].  First call, sounded like the call was picked up, no one spoke, call disconnected.  Second call, left message on voicemail that RN with [facility] is calling regarding pap smear results and follow-up that is needed. Please call Rob Mciver at [phone].

## 2022-12-23 NOTE — Telephone Encounter (Addendum)
Phone call to pt with [employer] at [phone]. Received message, cannot accept calls at this time. No voicemail. Unable to leave message. Tried twice.  Phone call to alternative number listed under pt contacts, [phone]. Mailbox full. Unable to leave message. Tried twice.  Phone call to another number listed under pt contacts, [phone]. Voicemail not set up. Unable to leave message. Tried twice.  Phone call to another number listed under pt contacts, [phone]. Female answered phone and then hung up. Called again, voicemail picked up for "Briana [name]." RN stated I am sorry, I believe I have reached the wrong number. Call disconnected.  MyChart is not active.

## 2022-12-25 NOTE — Telephone Encounter (Signed)
Phone call to pt with [employer] at [phone]. Left message stating that RN with [facility] is calling regarding pap smear results and follow up needed. Tried twice.

## 2022-12-26 NOTE — Telephone Encounter (Signed)
12/26/22 Certified letter mailed to pt re pap and follow-up needed. See scanned copy.

## 2022-12-26 NOTE — Telephone Encounter (Signed)
Phone call to pt with [employer] at [phone]. First call, sounded like someone picked up, no response to greeting. Second call, left message stating that RN with [facility] is calling regarding pap smear results and follow up needed.  After approx 3 minutes, called again hoping she had listened to message. Voicemail picked up. Did not leave another message.

## 2022-12-29 NOTE — Telephone Encounter (Signed)
Phone call to pt with [employer] at [phone]. Left message stating that RN with [facility] is calling regarding pap smear results and follow up needed. Please call Amada Hallisey at [phone]. Tried twice.

## 2023-01-01 NOTE — Telephone Encounter (Signed)
01/01/23 Colpo referral sent to Briana [name] with [facility] through [employer].

## 2023-01-01 NOTE — Telephone Encounter (Signed)
Pt presented to [facility] inquiring about [place] trying to contact her.  [employer] utilized. Pt counseled about pap smear results and colpo referral.  Pt confirms she does not have any kind of insurance; pt is interested in going through [employer].

## 2023-01-02 ENCOUNTER — Telehealth: Payer: Self-pay | Admitting: Hematology and Oncology

## 2023-02-25 ENCOUNTER — Ambulatory Visit (LOCAL_COMMUNITY_HEALTH_CENTER): Payer: Self-pay

## 2023-02-25 VITALS — BP 102/62 | Ht 64.0 in | Wt 138.0 lb

## 2023-02-25 DIAGNOSIS — Z3009 Encounter for other general counseling and advice on contraception: Secondary | ICD-10-CM

## 2023-02-25 DIAGNOSIS — Z3042 Encounter for surveillance of injectable contraceptive: Secondary | ICD-10-CM

## 2023-02-25 NOTE — Progress Notes (Signed)
11w 1d post depo. Voices no concerns. Depo given today per order by Aliene Altes, FNP dated 12/09/2022. Tolerated well in R deltoid. Next depo due 05/13/2023; patient aware.   Language line interpreter, Leretha Pol [phone], helped during this visit.   Abagail Kitchens, RN

## 2023-03-03 ENCOUNTER — Ambulatory Visit: Payer: Self-pay | Attending: Pediatrics | Admitting: Hematology and Oncology

## 2023-03-03 ENCOUNTER — Other Ambulatory Visit (HOSPITAL_COMMUNITY)
Admission: RE | Admit: 2023-03-03 | Discharge: 2023-03-03 | Disposition: A | Discharge status: Home / Self Care | Payer: Self-pay | Source: Ambulatory Visit | Attending: Pediatrics | Admitting: Pediatrics

## 2023-03-03 VITALS — BP 113/73 | Wt 137.0 lb

## 2023-03-03 DIAGNOSIS — R87610 Atypical squamous cells of undetermined significance on cytologic smear of cervix (ASC-US): Secondary | ICD-10-CM | POA: Insufficient documentation

## 2023-03-03 DIAGNOSIS — R87619 Unspecified abnormal cytological findings in specimens from cervix uteri: Secondary | ICD-10-CM

## 2023-03-03 DIAGNOSIS — Z01812 Encounter for preprocedural laboratory examination: Secondary | ICD-10-CM

## 2023-03-03 DIAGNOSIS — R87810 Cervical high risk human papillomavirus (HPV) DNA test positive: Secondary | ICD-10-CM | POA: Insufficient documentation

## 2023-03-03 NOTE — Progress Notes (Signed)
Briana [name] is a 27 y.o. female who presents to [facility] clinic today with no complaints.    Pap Smear: Pap not smear completed today. Last Pap smear was 12/09/22 and was abnormal - ASCUS/ HPV+ . Per patient has no history of an abnormal Pap smear. Last Pap smear result is available in Epic.   Physical exam: Breasts Breasts symmetrical. No skin abnormalities bilateral breasts. No nipple retraction bilateral breasts. No nipple discharge bilateral breasts. No lymphadenopathy. No lumps palpated bilateral breasts.       Pelvic/Bimanual Pap is not indicated today    GYNECOLOGY CLINIC COLPOSCOPY PROCEDURE NOTE  [name] is a 27 y.o. [id number] here for colposcopy for ASCUS with POSITIVE high risk HPV pap smear on 12/09/22. Discussed role for HPV in cervical dysplasia, need for surveillance.  Patient given informed consent, signed copy in the chart, time out was performed.  Placed in lithotomy position. Cervix viewed with speculum and colposcope after application of acetic acid.   Colposcopy adequate? Yes  no visible lesions.  ECC specimen obtained. All specimens were labelled and sent to pathology.  Patient was given post procedure instructions.  Will follow up pathology and manage accordingly.  Routine preventative health maintenance measures emphasized.   Briana Basset A, NP 03/03/2023 2:30 PM   Smoking History: Patient has never smoked and was not referred to quit line.    Patient Navigation: Patient education provided. Access to services provided for patient through [facility] program. Briana [name] [phone]) interpreter provided. No transportation provided   Colorectal Cancer Screening: Per patient has never had colonoscopy completed No complaints today.    Breast and Cervical Cancer Risk Assessment: Patient does not have family history of breast cancer, known genetic mutations, or radiation treatment to the chest before age 58. Patient  has history of cervical dysplasia, immunocompromised, or DES exposure in-utero.  Risk Assessment   No risk assessment data     A: [facility] exam without pap smear No complaints with benign breast exam. Colposcopy as above.   P: Will follow up accordingly with pathology results.   Pascal Lux, NP 03/03/2023 2:27 PM

## 2023-03-05 ENCOUNTER — Telehealth: Payer: Self-pay

## 2023-03-05 LAB — SURGICAL PATHOLOGY

## 2023-03-05 NOTE — Telephone Encounter (Signed)
Using [facility] interpreter, Joslyn Hy, we have attempted to reach pt to discuss her COLPO results. I was unable to reach the pt at this time. We will try to call the pt again.

## 2023-03-18 NOTE — Progress Notes (Signed)
Colpo completed by Pascal Lux, NP with [facility] on 03/03/23.  Reference surgical pathology lab result in Epic dated 03/03/23.  Information from 03/10/23 letter to patient from from Pascal Lux, NP (reference original letter in Epic): The results of your colposcopy (biopsy) test are within normal limits. Given your abnormal previous Pap smear, we recommend repeating your Pap smear every year.

## 2023-05-14 ENCOUNTER — Ambulatory Visit: Payer: Self-pay

## 2023-05-14 VITALS — BP 117/70 | Ht 64.0 in | Wt 141.5 lb

## 2023-05-14 DIAGNOSIS — Z3009 Encounter for other general counseling and advice on contraception: Secondary | ICD-10-CM

## 2023-05-14 DIAGNOSIS — Z3042 Encounter for surveillance of injectable contraceptive: Secondary | ICD-10-CM

## 2023-05-14 NOTE — Progress Notes (Signed)
11w 1d post depo. Voices no concerns. Depo given today per order by Aliene Altes, FNP dated 12/09/2022. Tolerated well in L deltoid. Next depo due 07/30/2023; patient has reminder.   Interpreter, Renea Ee [phone], helped during this visit.   Abagail Kitchens, RN

## 2023-07-30 ENCOUNTER — Ambulatory Visit: Payer: Self-pay

## 2023-07-30 VITALS — BP 116/70 | Ht 64.0 in | Wt 142.5 lb

## 2023-07-30 DIAGNOSIS — Z3042 Encounter for surveillance of injectable contraceptive: Secondary | ICD-10-CM

## 2023-07-30 DIAGNOSIS — Z3009 Encounter for other general counseling and advice on contraception: Secondary | ICD-10-CM

## 2023-07-30 NOTE — Progress Notes (Signed)
 11 weeks post depo.  Denies problems.  See flowsheet.  Depo given IM right deltoid per order by Aliene Altes FNP dated 12/09/22; tolerated well.  Next depo due 10/15/23; appt reminder given.  Roddie Mc Yemen interpreted.  Cherlynn Polo, RN

## 2023-10-09 ENCOUNTER — Emergency Department: Payer: Self-pay

## 2023-10-09 ENCOUNTER — Other Ambulatory Visit: Payer: Self-pay

## 2023-10-09 ENCOUNTER — Emergency Department
Admission: EM | Admit: 2023-10-09 | Discharge: 2023-10-09 | Disposition: A | Discharge status: Home / Self Care | Payer: Self-pay | Attending: Emergency Medicine | Admitting: Emergency Medicine

## 2023-10-09 DIAGNOSIS — R600 Localized edema: Secondary | ICD-10-CM | POA: Insufficient documentation

## 2023-10-09 DIAGNOSIS — M79604 Pain in right leg: Secondary | ICD-10-CM | POA: Insufficient documentation

## 2023-10-09 MED ORDER — CYCLOBENZAPRINE HCL 5 MG PO TABS: 5.0000 mg | ORAL_TABLET | Freq: Three times a day (TID) | ORAL | 0 refills | Status: AC | PRN

## 2023-10-09 MED ORDER — NAPROXEN 500 MG PO TABS: 500.0000 mg | ORAL_TABLET | Freq: Two times a day (BID) | ORAL | 1 refills | Status: AC

## 2023-10-09 NOTE — Discharge Instructions (Addendum)
 Your exam and x-ray are normal and reassuring at this time and no evidence of any hardware movement or loosening.  Your symptoms may represent chronic pain secondary to your leg trauma and surgery as well as some sciatic nerve irritation.  Prescription meds as directed.  Follow-up with your primary provider or orthopedics as discussed.  Su examen y radiografa son normales y tranquilizadores en este momento y no hay evidencia de movimiento o aflojamiento del hardware. Sus sntomas pueden Contractor crnico secundario a su trauma y [place] en la pierna, as como algo de irritacin del nervio citico. Medicamentos recetados segn lo indicado. Haga un seguimiento con su proveedor principal o con ortopedia como se discuti.

## 2023-10-09 NOTE — ED Notes (Signed)
 See triage note Presents with pain to right ankle,foot and hip  states pain is moving into entire leg  Denies any recent injury No swelling  or deformity noted  Good pulses
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
## 2023-10-15 ENCOUNTER — Ambulatory Visit: Payer: Self-pay

## 2023-10-15 VITALS — BP 112/60 | Ht 64.0 in | Wt 139.5 lb

## 2023-10-15 DIAGNOSIS — Z3009 Encounter for other general counseling and advice on contraception: Secondary | ICD-10-CM

## 2023-10-15 DIAGNOSIS — Z3042 Encounter for surveillance of injectable contraceptive: Secondary | ICD-10-CM

## 2023-10-15 NOTE — Progress Notes (Signed)
 11 Weeks   0 Days since last Depo Interpreter: M Yemen   Voices no concerns today.  Counseled to adhere to 11 to 13 week intervals between depo injections for optimal benefit.  Depo given today per order by Fain Home, FNP  dated 12/09/2022.  Tolerated well L delt.  Last PE 12/09/2022. Next depo and annual PE due 12/31/2023 has reminder card.  Khyle Goodell, RN

## 2024-01-01 ENCOUNTER — Encounter: Payer: Self-pay | Admitting: Family Medicine

## 2024-01-01 ENCOUNTER — Ambulatory Visit: Payer: Self-pay | Admitting: Family Medicine

## 2024-01-01 VITALS — BP 109/72 | HR 88 | Ht 64.0 in | Wt 139.0 lb

## 2024-01-01 DIAGNOSIS — Z113 Encounter for screening for infections with a predominantly sexual mode of transmission: Secondary | ICD-10-CM

## 2024-01-01 DIAGNOSIS — Z309 Encounter for contraceptive management, unspecified: Secondary | ICD-10-CM

## 2024-01-01 DIAGNOSIS — Z124 Encounter for screening for malignant neoplasm of cervix: Secondary | ICD-10-CM | POA: Insufficient documentation

## 2024-01-01 DIAGNOSIS — Z30013 Encounter for initial prescription of injectable contraceptive: Secondary | ICD-10-CM

## 2024-01-01 DIAGNOSIS — R87610 Atypical squamous cells of undetermined significance on cytologic smear of cervix (ASC-US): Secondary | ICD-10-CM

## 2024-01-01 DIAGNOSIS — Z3042 Encounter for surveillance of injectable contraceptive: Secondary | ICD-10-CM | POA: Insufficient documentation

## 2024-01-01 DIAGNOSIS — Z01419 Encounter for gynecological examination (general) (routine) without abnormal findings: Secondary | ICD-10-CM

## 2024-01-01 LAB — WET PREP FOR TRICH, YEAST, CLUE
Clue Cell Exam: NEGATIVE
Trichomonas Exam: NEGATIVE
Yeast Exam: NEGATIVE

## 2024-01-01 LAB — HM HIV SCREENING LAB: HM HIV Screening: NEGATIVE

## 2024-01-01 MED ORDER — MEDROXYPROGESTERONE ACETATE 150 MG/ML IM SUSY
150.0000 mg | PREFILLED_SYRINGE | Freq: Once | INTRAMUSCULAR | Status: AC
  Administered 2024-01-01: 150 mg via INTRAMUSCULAR

## 2024-01-01 NOTE — Assessment & Plan Note (Signed)
 ASCUS HPV(+) PAP 12/2022 Colpo 03/03/23, no CIN Repeat PAP collected today.

## 2024-01-01 NOTE — Progress Notes (Signed)
 [employer] HEALTH DEPARTMENT [facility] [address] [place] [address] Main phone: [phone]  Family Planning Visit - Initial Visit  Due to language barrier, a Spanish interpreter [place], [phone]) was present by phone during the history-taking, subsequent discussion, and physical exam with this patient.   Subjective:  Briana [name] is a 28 y.o.  G2P2002   being seen today for an initial annual visit and to discuss reproductive life planning.  The patient is currently using hormonal injection for pregnancy prevention. Patient does not want a pregnancy in the next year.   Patient reports they are looking for a method with the following characteristics:  High efficacy at preventing pregnancy Long term method Method that does not involve too much memory  Patient has the following medical conditions: Patient Active Problem List   Diagnosis Date Noted   Cervical cancer screening 01/01/2024   Encounter for surveillance of injectable contraceptive 01/01/2024   Abnormal Pap smear of cervix 12/19/2022   History of depression 02/10/2021   ETOH abuse    Chief Complaint  Patient presents with   Annual Exam   HPI Patient reports she would like her physical exam and depo provera .   Depo Provera : chart shows continuous use since 05/2018. Due for PAP smear (one year f/u from colposcopy for ASCUS HPV+ PAP 2024)  No history of HTN, PE, stroke, migraine with aura, congenital clotting disorders, or smoking. Did have a history of DVT, but provoked due to trauma and subsequent surgeries.   Review of Systems  Constitutional:  Negative for fever, malaise/fatigue and weight loss.  Respiratory:  Negative for shortness of breath.   Cardiovascular:  Negative for chest pain and palpitations.   Diabetes screening This patient is 28 y.o. with a BMI of Body mass index is 23.86 kg/m.SABRA  Is patient eligible for diabetes screening (age >35  and BMI >25)?  no  Was Hgb A1c ordered? not applicable  STI screening Patient reports 1 of partners in last year.  Does this patient desire STI screening?  Yes  Hepatitis C screening Has patient been screened once for HCV in the past?  Yes   Lab Results  Component Value Date   HCVAB <0.1 06/01/2017   Does the patient meet criteria for HCV testing? No   Hepatitis B screening Does the patient meet criteria for HBV testing? 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 ENDOCERVIX, CURETTAGE:      Scant superficial...   12/09/2022 IGP, rfx Aptima HPV ASCU DIAGNOSIS:: Comment (A) Specimen adequacy:: Comment Clinician Provided ICD10: Comment Performed by:: Comment Electronically signed by:: Comment PAP Smear Comment: . PATHOLOGIST PROVIDED ICD10:: Comment Note:: Comment Test Methodology: Comment PAP Reflex: Comment    Health Maintenance Due  Topic Date Due   Hepatitis B Vaccines (1 of 3 - 19+ 3-dose series) Never done   HPV VACCINES (1 - 3-dose SCDM series) Never done   COVID-19 Vaccine (1 - 2024-25 season) Never done   Cervical Cancer Screening (Pap smear)  12/09/2023   INFLUENZA VACCINE  01/01/2024   The following portions of the patient's history were reviewed and updated as appropriate: allergies, current medications, past family history, past medical history, past social history, past surgical history  and problem list. Problem list updated.  See flowsheet for further details and programmatic requirements Hyperlink available at the top of the signed note in blue.  Flow sheet content below:  Pregnancy Intention Screening Does the patient want to become pregnant in the next year?: No Does the  patient's partner want to become pregnant in the next year?: No Would the patient like to discuss contraceptive options today?: Yes Other:  Password: Briana [name] Is it okay to contact you by mail?: Yes Difficulty accessing hygiene products (feminine products/soap) in the last 3 months: No Contraception History Past methods of contraception used by patient:: Hormonal Injection Adverse effects associated with Hormonal Injection: none Sexual History What age did you start your period?: 13 How often do you have your period?: none with injection Date of last sex?: 12/25/23 Has the patient had unprotected sex within the last 5 days?: No Do you have sex with men, women, both men and women?: Men only In the past 2 months how many partners have you had sex with?: 1 In the past 12 months, how many partners have you had sex with?: 1 Is it possible that any of your sex partners in the past 12 months had sex with someone else whild they were still in a sexual relationship with you?: No What ways do you have sex?: Vaginal Do you or your partner use condoms and/or dental dams every time you have vaginal, oral or anal sex?: No Do you douche?: No Date of last HIV test?: 05/17/18 Have you ever had an STD?: No Have any of your partners had an STD?: No Have you or your partner ever shot up drugs?: No Have any of your partners used drugs in the past?: No Have you or your partners exchanged money or drugs for sex?: No Risk Factors for Hep B Household, sexual, or needle sharing contact of a person infected with Hep B: No Sexual contact with a person who uses drugs not as prescribed?: No Currently or Ever used drugs not as prescribed: No HIV Positive: No PRep Patient: No Men who have sex with men: N/A Have Hepatitis C: No History of Incarceration: No History of Homeslessness?: No Anal sex following anal drug use?: N/A Risk Factors for Hep C Currently using drugs not as prescribed: No Sexual partner(s)  currently using drugs as not prescribed: No History of drug use: No HIV Positive: No People with a history of incarceration: No People born between the years of [birth year] and [birth year]: No Counseling Education: Make informed decision about family planning, Provided preconception counseling, Reduce risk of transmission and protection from STD's and HIV, Understand BMI >25 or >18.5 is a health risk (weight management educational materials to be provided to client requests), Promoted daily consumption of MVI with folic acid if capable of conceiving., Review immunization history, inform client of recommended vaccines per CDC's ACIP Guidelines and refer to Immunization clinic, Results of physical assessment and labs (if performed), How to discontinue the method selected and information on back up method used, How to use the method selected and information on back up method used, How to use the method consistently and correctly, Teach back method completed, Warning signs for rare but serious adverse events and what to do if they experience a warning sign (including emergency 24 hour number, where to seek emergency service outside of hours of operation), When to return for follow up (planned return schedule), PCP list given to patient Contraception Wrap Up Current Method: Hormonal Injection End Method: Hormonal Injection  Contraception Counseling Provided: Yes How was the end contraceptive method provided?: Provided on site Interpreter Interpreters used: Briana [name]  Objective:   Vitals:   01/01/24 1505  BP: 109/72  Pulse: 88  Weight: 139 lb (63 kg)  Height: 5' 4 (1.626 m)   Physical Exam Vitals and nursing note reviewed. Exam conducted with a chaperone present.  Constitutional:      Appearance: Normal appearance.  HENT:     Head: Normocephalic and atraumatic.     Mouth/Throat:     Mouth: Mucous membranes are moist.     Pharynx: Oropharynx is clear. No oropharyngeal exudate or posterior  oropharyngeal erythema.  Eyes:     General: No scleral icterus.       Right eye: No discharge.        Left eye: No discharge.  Cardiovascular:     Rate and Rhythm: Normal rate and regular rhythm.     Heart sounds: Normal heart sounds.  Pulmonary:     Effort: Pulmonary effort is normal.     Breath sounds: Normal breath sounds.  Abdominal:     General: Abdomen is flat.     Palpations: There is no mass.     Tenderness: There is no abdominal tenderness. There is no rebound.  Genitourinary:    General: Normal vulva.     Exam position: Lithotomy position.     Pubic Area: No rash or pubic lice.      Tanner stage (genital): 5.     Labia:        Right: No rash or lesion.        Left: No rash or lesion.      Vagina: Normal. No vaginal discharge, erythema, bleeding or lesions.     Cervix: No cervical motion tenderness, discharge, friability, lesion or erythema.     Uterus: Normal.      Adnexa: Right adnexa normal and left adnexa normal.  Musculoskeletal:     Cervical back: Neck supple. No rigidity or tenderness.  Lymphadenopathy:     Head:     Right side of head: No submandibular, preauricular or posterior auricular adenopathy.     Left side of head: No submandibular, preauricular or posterior auricular adenopathy.     Cervical: No cervical adenopathy.     Right cervical: No superficial or posterior cervical adenopathy.    Left cervical: No superficial or posterior cervical adenopathy.     Upper Body:     Right upper body: No supraclavicular adenopathy.     Left upper body: No supraclavicular adenopathy.  Skin:    General: Skin is warm and dry.     Capillary Refill: Capillary refill takes less than 2 seconds.     Coloration: Skin is not jaundiced or pale.     Findings: No bruising, erythema, lesion or rash.  Neurological:     General: No focal deficit present.     Mental Status: She is alert and oriented to person, place, and time.  Psychiatric:        Mood and Affect: Mood  normal.        Behavior: Behavior normal.    Assessment and Plan:  Briana [name] is a 28 y.o. female presenting to the [facility] Department for an initial annual wellness/contraceptive visit  Contraception counseling:  Reviewed options based on patient desire and reproductive life plan. Patient is interested in Hormonal Injection. This was provided to the patient today.   Risks, benefits, and typical effectiveness rates  were reviewed.  Questions were answered.  Written information was also given to the patient to review.    The patient will follow up in  3 months for surveillance.  The patient was told to call with any further questions, or with any concerns about this method of contraception.  Emphasized use of condoms 100% of the time for STI prevention.  Emergency Contraception Precautions (ECP): Patient assessed for need of ECP. She is not a candidate based on depo provera  within recommended dates.   Well woman exam  Cervical cancer screening Assessment & Plan: PAP collected today for one year f/u from colposcopy for ASCUS HPV+ PAP 2024.   Orders: -     IGP, Aptima HPV  Atypical squamous cells of undetermined significance on cytologic smear of cervix (ASC-US ) Assessment & Plan: ASCUS HPV(+) PAP 12/2022 Colpo 03/03/23, no CIN Repeat PAP collected today.   Orders: -     IGP, Aptima HPV  Screening examination for venereal disease -     WET PREP FOR TRICH, YEAST, CLUE -     Chlamydia/Gonorrhea [facility] -     HIV [place] -     Syphilis Serology, [facility]  Encounter for surveillance of injectable contraceptive Assessment & Plan: Patient reports continuous use of Depo Provera  for about 10 years, since the birth of her daughter. Discussed risks of using this past 3-5 years, most notably osteopenia. Extensively discussed other contraceptive methods. She would like to have one depo provera  injection today and will return in 3 months to  switch methods.   Orders: -     medroxyPROGESTERone  Acetate   No follow-ups on file.  No future appointments.  Betsey CHRISTELLA Helling, MD

## 2024-01-01 NOTE — Assessment & Plan Note (Addendum)
 Patient reports continuous use of Depo Provera  for about 10 years, since the birth of her daughter. Discussed risks of using this past 3-5 years, most notably osteopenia. Extensively discussed other contraceptive methods. She would like to have one depo provera  injection today and will return in 3 months to switch methods.

## 2024-01-01 NOTE — Progress Notes (Signed)
 Pt is here for family planning visit.  Family planning education card reviewed and given to pt.  Wet prep results reviewed, no treatment required per standing orders. Depo given in R deltoid and tolerated well, reminder card given. Larraine JONELLE Northern, RN

## 2024-01-01 NOTE — Patient Instructions (Signed)
 Depo Provera  Today we gave you the injectable birth control called Depo Provera .  Please come back in 11-12 weeks for your next injection.  Depo Provera  Hoy le administramos el anticonceptivo inyectable llamado Depo Provera .  Regrese en 11-12 semanas para su prxima inyeccin.  PAP Smear Today we performed a PAP smear to screen for cervical cancer. The results should be back in 1-2 weeks.  Once we have the results we can determine when your next screening should be.   Prueba de Papanicolaou Hoy le realizamos una prueba de Papanicolaou para detectar cncer de cuello uterino. Los resultados deberan estar disponibles en una o [employer]. Una vez que los tengamos, podremos determinar cundo debera ser su prxima prueba.  STI screening - Today we obtained a vaginal swab to screen for gonorrhea, chlamydia, and trichomonas and oral swab to screen for gonorrhea - We also obtained a blood sample to screen for HIV and syphilis - If the results are abnormal, I will give you a call.   Prueba de ITS - Hoy nos hicieron una hisopado vaginal para detectar gonorrea, clamidia y tricomonas and hisopado oral para detectar gonorrea y clamidia - Tambin nos hicieron ignacia jubilee de sangre para detectar HIV and syphilis - Si los resultados son anormales, Engineer, structural.  Estimated time frame for results collected at the [facility] Department: Tiempo estimado para obtener los resultados en el [facility] [place]: Same day El mismo da  Trichomonas Yeast / Vaguda BV (bacterial vaginosis) / VB (vaginosis bacteriana)   Within 1-2 weeks En 1-2 semanas  Gonorrhea / Gonorrea Chlamydia / Clamidia  Within 1-2 weeks En 1-2 semanas HIV / VIH Syphilis / Sfilis Hepatitis B Hepatitis C

## 2024-01-05 NOTE — Assessment & Plan Note (Signed)
 PAP collected today for one year f/u from colposcopy for ASCUS HPV+ PAP 2024.

## 2024-01-08 ENCOUNTER — Encounter: Payer: Self-pay | Admitting: Family Medicine

## 2024-01-12 LAB — IGP, APTIMA HPV
HPV Aptima: POSITIVE — AB
PAP Smear Comment: 0

## 2024-01-17 ENCOUNTER — Ambulatory Visit: Payer: Self-pay | Admitting: Family Medicine

## 2024-01-17 NOTE — Progress Notes (Signed)
 PAP smear reviewed, result: ASC-US , HPV positive. Based on this result and patient's prior cervical cancer screenings, ASCCP currently recommends repeat PAP smear in 1 year with HPV co-testing.  Briana Helling, MD 01/17/24  6:31 PM

## 2024-03-16 ENCOUNTER — Ambulatory Visit: Payer: Self-pay

## 2024-03-18 ENCOUNTER — Ambulatory Visit: Payer: Self-pay

## 2024-03-21 ENCOUNTER — Ambulatory Visit (LOCAL_COMMUNITY_HEALTH_CENTER): Payer: Self-pay

## 2024-03-21 VITALS — BP 95/57 | Ht 64.0 in | Wt 140.5 lb

## 2024-03-21 DIAGNOSIS — Z3042 Encounter for surveillance of injectable contraceptive: Secondary | ICD-10-CM

## 2024-03-21 DIAGNOSIS — Z3009 Encounter for other general counseling and advice on contraception: Secondary | ICD-10-CM

## 2024-03-21 DIAGNOSIS — Z30013 Encounter for initial prescription of injectable contraceptive: Secondary | ICD-10-CM

## 2024-03-21 MED ORDER — MEDROXYPROGESTERONE ACETATE 150 MG/ML IM SUSP
150.0000 mg | Freq: Once | INTRAMUSCULAR | Status: AC
  Administered 2024-03-21: 150 mg via INTRAMUSCULAR

## 2024-03-21 NOTE — Progress Notes (Signed)
 11 Weeks   3 Days since last Depo Interpreter: pacific interpreter id [phone]    Wants to continue depo today, but wants to switch methods d/t risk of osteoporosis on depo. Considering implant.   Consult Dr JAYSON Helling who gives order for depo today and advises patient to schedule provider visit to discuss bc options.   Depo given today per verbal order by Dr JAYSON Helling. Tolerated well R delt.  [facility] provider appt scheduled 05/02/2024 at 9:15am. Appt card given.  Should patient decide to continue depo, the Next depo is due 06/06/2024,  has reminder card.  Hibo Blasdell, RN

## 2024-05-02 ENCOUNTER — Ambulatory Visit: Payer: Self-pay

## 2024-06-06 ENCOUNTER — Ambulatory Visit (LOCAL_COMMUNITY_HEALTH_CENTER): Payer: Self-pay

## 2024-06-06 VITALS — BP 116/58 | Ht 64.0 in | Wt 147.0 lb

## 2024-06-06 DIAGNOSIS — Z3042 Encounter for surveillance of injectable contraceptive: Secondary | ICD-10-CM

## 2024-06-06 DIAGNOSIS — Z3009 Encounter for other general counseling and advice on contraception: Secondary | ICD-10-CM

## 2024-06-06 MED ORDER — MEDROXYPROGESTERONE ACETATE 150 MG/ML IM SUSP
150.0000 mg | Freq: Once | INTRAMUSCULAR | Status: AC
  Administered 2024-06-06: 150 mg via INTRAMUSCULAR

## 2024-06-06 NOTE — Progress Notes (Signed)
 11 Weeks   0 Days since last Depo Interpreter: pacific interpreter [phone]    Patient explains she wants to continue depo for now, but is considering switching to another birth control method. Plans to call to schedule appt to switch methods when she is ready.   Patient does not have yearly depo order because she was planning to switch to another birth control method.  Consult VEAR Bers, FNP who gives ok for patient to have depo until annual is due (approx 01/01/2025.)    Counseled to adhere to 11 to 13 week intervals between depo injections for optimal benefit.  Depo given today per order by VEAR Bers, FNP  dated today (06/06/2024).  Tolerated well L delt.  Next depo due 08/22/2024,  has reminder card.  Servando Kyllonen, RN
